# Patient Record
Sex: Male | Born: 1940
Health system: Southern US, Community
[De-identification: ages and names within clinical notes are randomized; demographics above are authoritative.]

## PROBLEM LIST (undated history)

## (undated) DIAGNOSIS — K635 Polyp of colon: Secondary | ICD-10-CM

## (undated) DIAGNOSIS — F329 Major depressive disorder, single episode, unspecified: Secondary | ICD-10-CM

## (undated) DIAGNOSIS — T7840XA Allergy, unspecified, initial encounter: Secondary | ICD-10-CM

## (undated) DIAGNOSIS — E785 Hyperlipidemia, unspecified: Secondary | ICD-10-CM

## (undated) DIAGNOSIS — R112 Nausea with vomiting, unspecified: Secondary | ICD-10-CM

## (undated) DIAGNOSIS — I499 Cardiac arrhythmia, unspecified: Secondary | ICD-10-CM

## (undated) DIAGNOSIS — F32A Depression, unspecified: Secondary | ICD-10-CM

## (undated) DIAGNOSIS — I10 Essential (primary) hypertension: Secondary | ICD-10-CM

## (undated) DIAGNOSIS — R51 Headache: Secondary | ICD-10-CM

## (undated) DIAGNOSIS — F410 Panic disorder [episodic paroxysmal anxiety] without agoraphobia: Secondary | ICD-10-CM

## (undated) DIAGNOSIS — I484 Atypical atrial flutter: Secondary | ICD-10-CM

## (undated) DIAGNOSIS — IMO0001 Reserved for inherently not codable concepts without codable children: Secondary | ICD-10-CM

## (undated) DIAGNOSIS — K219 Gastro-esophageal reflux disease without esophagitis: Secondary | ICD-10-CM

## (undated) DIAGNOSIS — I4891 Unspecified atrial fibrillation: Secondary | ICD-10-CM

## (undated) DIAGNOSIS — K5790 Diverticulosis of intestine, part unspecified, without perforation or abscess without bleeding: Secondary | ICD-10-CM

## (undated) DIAGNOSIS — Z9889 Other specified postprocedural states: Secondary | ICD-10-CM

## (undated) HISTORY — DX: Panic disorder (episodic paroxysmal anxiety): F41.0

## (undated) HISTORY — DX: Essential (primary) hypertension: I10

## (undated) HISTORY — DX: Headache: R51

## (undated) HISTORY — PX: VASECTOMY: SHX75

## (undated) HISTORY — DX: Hyperlipidemia, unspecified: E78.5

## (undated) HISTORY — DX: Unspecified atrial fibrillation: I48.91

## (undated) HISTORY — DX: Gastro-esophageal reflux disease without esophagitis: K21.9

## (undated) HISTORY — DX: Diverticulosis of intestine, part unspecified, without perforation or abscess without bleeding: K57.90

## (undated) HISTORY — DX: Allergy, unspecified, initial encounter: T78.40XA

## (undated) HISTORY — PX: INGUINAL HERNIA REPAIR: SUR1180

## (undated) HISTORY — DX: Major depressive disorder, single episode, unspecified: F32.9

## (undated) HISTORY — DX: Polyp of colon: K63.5

## (undated) HISTORY — PX: OTHER SURGICAL HISTORY: SHX169

## (undated) HISTORY — DX: Atypical atrial flutter: I48.4

## (undated) HISTORY — DX: Depression, unspecified: F32.A

## (undated) HISTORY — DX: Reserved for inherently not codable concepts without codable children: IMO0001

---
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
## 2006-02-12 LAB — HM COLONOSCOPY: HM Colonoscopy: NORMAL

## 2006-04-21 ENCOUNTER — Ambulatory Visit: Payer: Self-pay | Admitting: Gastroenterology

## 2006-05-07 ENCOUNTER — Ambulatory Visit: Payer: Self-pay | Admitting: Gastroenterology

## 2006-05-07 ENCOUNTER — Encounter (INDEPENDENT_AMBULATORY_CARE_PROVIDER_SITE_OTHER): Payer: Self-pay | Admitting: Specialist

## 2007-12-29 HISTORY — PX: CARDIAC CATHETERIZATION: SHX172

## 2008-11-27 DIAGNOSIS — I4891 Unspecified atrial fibrillation: Secondary | ICD-10-CM

## 2008-11-27 HISTORY — DX: Unspecified atrial fibrillation: I48.91

## 2008-12-12 ENCOUNTER — Encounter: Admission: RE | Admit: 2008-12-12 | Discharge: 2008-12-12 | Payer: Self-pay | Admitting: Interventional Cardiology

## 2008-12-13 ENCOUNTER — Inpatient Hospital Stay (HOSPITAL_BASED_OUTPATIENT_CLINIC_OR_DEPARTMENT_OTHER): Admission: RE | Admit: 2008-12-13 | Discharge: 2008-12-13 | Payer: Self-pay | Admitting: Interventional Cardiology

## 2008-12-17 ENCOUNTER — Encounter: Admission: RE | Admit: 2008-12-17 | Discharge: 2008-12-17 | Payer: Self-pay | Admitting: Interventional Cardiology

## 2009-02-19 ENCOUNTER — Ambulatory Visit: Payer: Self-pay | Admitting: Surgery

## 2009-12-09 ENCOUNTER — Encounter: Admission: RE | Admit: 2009-12-09 | Discharge: 2009-12-09 | Payer: Self-pay | Admitting: Family Medicine

## 2010-01-20 ENCOUNTER — Encounter: Admission: RE | Admit: 2010-01-20 | Discharge: 2010-01-20 | Payer: Self-pay | Admitting: Surgery

## 2010-01-28 ENCOUNTER — Ambulatory Visit: Payer: Self-pay | Admitting: Surgery

## 2011-01-17 ENCOUNTER — Other Ambulatory Visit: Payer: Self-pay | Admitting: Surgery

## 2011-01-17 DIAGNOSIS — I719 Aortic aneurysm of unspecified site, without rupture: Secondary | ICD-10-CM

## 2011-02-03 ENCOUNTER — Ambulatory Visit
Admission: RE | Admit: 2011-02-03 | Discharge: 2011-02-03 | Disposition: A | Discharge status: Home / Self Care | Payer: Medicare Other | Source: Ambulatory Visit | Attending: Surgery | Admitting: Surgery

## 2011-02-03 ENCOUNTER — Encounter (INDEPENDENT_AMBULATORY_CARE_PROVIDER_SITE_OTHER): Payer: Medicare Other | Admitting: Surgery

## 2011-02-03 ENCOUNTER — Other Ambulatory Visit: Payer: Self-pay | Admitting: Surgery

## 2011-02-03 ENCOUNTER — Encounter: Payer: Self-pay | Admitting: Surgery

## 2011-02-03 DIAGNOSIS — I712 Thoracic aortic aneurysm, without rupture, unspecified: Secondary | ICD-10-CM

## 2011-02-03 DIAGNOSIS — I719 Aortic aneurysm of unspecified site, without rupture: Secondary | ICD-10-CM

## 2011-02-03 DIAGNOSIS — I729 Aneurysm of unspecified site: Secondary | ICD-10-CM

## 2011-02-03 MED ORDER — GADOBENATE DIMEGLUMINE 529 MG/ML IV SOLN
20.0000 mL | Freq: Once | INTRAVENOUS | Status: AC | PRN
  Administered 2011-02-03: 20 mL via INTRAVENOUS

## 2011-02-06 NOTE — Assessment & Plan Note (Signed)
OFFICE VISIT  ZVI, DUPLANTIS DOB:  08-27-1941                                        February 03, 2011 CHART #:  [id number]  The patient returned to my office today for followup of a ascending aortic aneurysm.  I last saw him on January 28, 2010.  His last MR angiogram of the chest dated January 20, 2010, that showed a maximum diameter to be 4.8 cm at the level of the aortic root which did not appear any difference from the previous scan 1 year prior.  He said that over the past year, he has continued to feel fairly well overall.  He has had no chest pain or pressure.  He denies any shortness of breath. He has been watching his diet very closely and has lost weight.  He has been walking about one mile per day over about 20 minutes and said that he was never tired at the end of that walk.  He has been hesitant to take up the pace.  PHYSICAL EXAMINATION:  Blood pressure 142/84, pulse is 76 and regular, respiratory rate is 18 unlabored.  Oxygen saturation on room air is 97%. He looks well.  Cardiac exam shows regular rate and rhythm with no murmur or gallop.  His lungs are clear.  There is no peripheral edema.  A MR angiogram done today shows no change in the size of the fusiform ascending aortic aneurysm compared to his previous MRI on January 20, 2010.  IMPRESSION:  The patient as a stable 4.8-cm fusiform ascending aortic aneurysm.  I will plan to repeat his MR angiogram in 1-year.  We discussed good blood pressure control again.  He seems very motivated to modify any cardiac risk factors and is trying to take good care of himself over the help of his wife.  I will see him back in 1-year.  Evelene Croon, M.D. Electronically Signed  BB/MEDQ  D:  02/03/2011  T:  02/03/2011  Job:  [id number]  cc:   Lyn Records, M.D. Evelena Peat, M.D.

## 2011-02-12 ENCOUNTER — Ambulatory Visit (INDEPENDENT_AMBULATORY_CARE_PROVIDER_SITE_OTHER): Payer: Medicare Other | Admitting: Family Medicine

## 2011-02-12 ENCOUNTER — Encounter: Payer: Self-pay | Admitting: Family Medicine

## 2011-02-12 DIAGNOSIS — I712 Thoracic aortic aneurysm, without rupture, unspecified: Secondary | ICD-10-CM

## 2011-02-12 DIAGNOSIS — I10 Essential (primary) hypertension: Secondary | ICD-10-CM

## 2011-02-12 DIAGNOSIS — IMO0001 Reserved for inherently not codable concepts without codable children: Secondary | ICD-10-CM

## 2011-02-12 DIAGNOSIS — K219 Gastro-esophageal reflux disease without esophagitis: Secondary | ICD-10-CM

## 2011-02-12 DIAGNOSIS — E785 Hyperlipidemia, unspecified: Secondary | ICD-10-CM | POA: Insufficient documentation

## 2011-02-12 NOTE — Patient Instructions (Signed)
Continue weight loss efforts. Continue regular exercise.

## 2011-02-12 NOTE — Progress Notes (Signed)
  Subjective:    Patient ID: Joseph [name], male    DOB: [birth year], 70 y.o.   MRN: [id number]  HPI  Patient is new to establish care.   Chronic problems include history of GERD , history depression , hypertension , hyperlipidemia , and ascending thoracic aneurysm 4-1/2 cm diameter and followed by cardiothoracic surgery.   Medications reviewed. Previous flushing with Crestor and only takes this once per week. Blood pressure controlled with Diovan 320 mg daily, metoprolol 100 mg daily and takes hydrochlorothiazide only once per week. Blood pressures consistently less than 130/80 at home. No orthostatic symptoms.   Lipids from 3 months ago at prior physician reviewed with LDL low 100 range.    patient had prior appendectomy. Family history significant for father with lung cancer. Mother heart disease and brother with heart disease in his 84s.  Social history is that he is nonsmoker. No alcohol use. Prior colonoscopy 2007. Tetanus 2008. Pneumovax 2008.   Review of Systems  Constitutional: Negative for fever, activity change, appetite change and fatigue.  HENT: Negative for ear pain, congestion and trouble swallowing.   Eyes: Negative for pain and visual disturbance.  Respiratory: Negative for cough, shortness of breath and wheezing.   Cardiovascular: Negative for chest pain and palpitations.  Gastrointestinal: Negative for nausea, vomiting, abdominal pain, diarrhea, constipation, blood in stool, abdominal distention and rectal pain.  Genitourinary: Negative for dysuria, hematuria and testicular pain.  Musculoskeletal: Negative for joint swelling and arthralgias.  Skin: Negative for rash.  Neurological: Negative for dizziness, syncope and headaches.  Hematological: Negative for adenopathy.  Psychiatric/Behavioral: Negative for confusion and dysphoric mood.       Objective:   Physical Exam  patient is alert and in no distress. He is moderately obese  H. EENT eardrums normal.  Oropharynx clear. Pupils equal reactive to light. Neck supple no mass Chest clear to auscultation Heart regular rhythm and rate with no murmur Extremities trace edema lower legs bilaterally       Assessment & Plan:   #1 hypertension stable  #2 hyperlipidemia  #3  Ascending thoracic aneurysm followed by CVTS #4 history of GERD #5 history of anxiety and depression

## 2011-02-26 ENCOUNTER — Emergency Department (HOSPITAL_COMMUNITY)
Admission: EM | Admit: 2011-02-26 | Discharge: 2011-02-26 | Disposition: A | Discharge status: Home / Self Care | Payer: Medicare Other | Attending: Emergency Medicine | Admitting: Emergency Medicine

## 2011-02-26 ENCOUNTER — Emergency Department (HOSPITAL_COMMUNITY): Payer: Medicare Other

## 2011-02-26 DIAGNOSIS — R0989 Other specified symptoms and signs involving the circulatory and respiratory systems: Secondary | ICD-10-CM | POA: Insufficient documentation

## 2011-02-26 DIAGNOSIS — Z79899 Other long term (current) drug therapy: Secondary | ICD-10-CM | POA: Insufficient documentation

## 2011-02-26 DIAGNOSIS — R002 Palpitations: Secondary | ICD-10-CM | POA: Insufficient documentation

## 2011-02-26 DIAGNOSIS — I10 Essential (primary) hypertension: Secondary | ICD-10-CM | POA: Insufficient documentation

## 2011-02-26 DIAGNOSIS — R000 Tachycardia, unspecified: Secondary | ICD-10-CM | POA: Insufficient documentation

## 2011-02-26 DIAGNOSIS — R0609 Other forms of dyspnea: Secondary | ICD-10-CM | POA: Insufficient documentation

## 2011-02-26 DIAGNOSIS — E7800 Pure hypercholesterolemia, unspecified: Secondary | ICD-10-CM | POA: Insufficient documentation

## 2011-02-26 DIAGNOSIS — F411 Generalized anxiety disorder: Secondary | ICD-10-CM | POA: Insufficient documentation

## 2011-02-26 LAB — TSH: TSH: 2.223 u[IU]/mL (ref 0.350–4.500)

## 2011-02-26 LAB — BRAIN NATRIURETIC PEPTIDE: Pro B Natriuretic peptide (BNP): 79 pg/mL (ref 0.0–100.0)

## 2011-02-26 LAB — CBC
HCT: 41.8 % (ref 39.0–52.0)
Hemoglobin: 15.1 g/dL (ref 13.0–17.0)
RDW: 12.6 % (ref 11.5–15.5)
WBC: 10.6 10*3/uL — ABNORMAL HIGH (ref 4.0–10.5)

## 2011-02-26 LAB — DIFFERENTIAL
Basophils Absolute: 0 10*3/uL (ref 0.0–0.1)
Lymphocytes Relative: 5 % — ABNORMAL LOW (ref 12–46)
Neutro Abs: 9.6 10*3/uL — ABNORMAL HIGH (ref 1.7–7.7)

## 2011-02-26 LAB — PROTIME-INR
INR: 0.98 (ref 0.00–1.49)
Prothrombin Time: 13.2 seconds (ref 11.6–15.2)

## 2011-02-26 LAB — POCT CARDIAC MARKERS: CKMB, poc: <1 ng/mL — ABNORMAL LOW (ref 1.0–8.0)

## 2011-02-26 LAB — COMPREHENSIVE METABOLIC PANEL
Albumin: 4.2 g/dL (ref 3.5–5.2)
Alkaline Phosphatase: 47 U/L (ref 39–117)
BUN: 7 mg/dL (ref 6–23)
Calcium: 9 mg/dL (ref 8.4–10.5)
Creatinine, Ser: 0.99 mg/dL (ref 0.4–1.5)
Potassium: 3.8 mEq/L (ref 3.5–5.1)
Total Protein: 6.5 g/dL (ref 6.0–8.3)

## 2011-02-26 LAB — POCT I-STAT, CHEM 8
Chloride: 102 mEq/L (ref 96–112)
HCT: 45 % (ref 39.0–52.0)
Potassium: 3.8 mEq/L (ref 3.5–5.1)
Sodium: 140 mEq/L (ref 135–145)

## 2011-02-26 LAB — RAPID URINE DRUG SCREEN, HOSP PERFORMED
Amphetamines: NOT DETECTED
Cocaine: NOT DETECTED
Tetrahydrocannabinol: NOT DETECTED

## 2011-02-26 LAB — GLUCOSE, CAPILLARY: Glucose-Capillary: 126 mg/dL — ABNORMAL HIGH (ref 70–99)

## 2011-02-26 LAB — APTT: aPTT: 28 seconds (ref 24–37)

## 2011-02-26 LAB — TROPONIN I: Troponin I: <0.01 ng/mL (ref 0.00–0.06)

## 2011-03-06 NOTE — H&P (Signed)
NAMELANEY, BAGSHAW NO.:  [id number]  MEDICAL RECORD NO.:  [id number]           PATIENT TYPE:  E  LOCATION:  [facility]                         FACILITY:  [facility]  PHYSICIAN:  Lyn Records, M.D.   DATE OF BIRTH:  04-May-1941  DATE OF ADMISSION:  02/26/2011 DATE OF DISCHARGE:  02/26/2011                             HISTORY & PHYSICAL   This is an emergency room evaluation at the request of the emergency room staff.  CONCLUSIONS: 1. Sustained tachy palpitations of uncertain etiology, rule out PSVT. 2. Stable descending aortic aneurysm. 3. Hypertension. 4. Hyperlipidemia. 5. Anxiety disorder. 6. Obesity.  RECOMMENDATIONS: 1. Continuous ambulatory monitor for 2 weeks. 2. Continue the current medical regimen as listed in the chart below. 3  Engage in typical activity.  COMMENTS:  The patient is well known to me.  I last saw him in August 2011 when he was doing relatively well with the exception of an elevation in blood pressure.  The patient has not been sleeping well lately.  He has been having episodes of tachycardia that he can both feel in his chest and hear in his left year.  He states that since 2010 he has been having tinnitus particularly in the left ear.  Now, he can hear his heart rates.  When the heart rate increases above about 70 beats per minute, he gets frightened.  He has an underlying anxiety disorder.  Over the past 2-3 weeks, he has noted spells of very rapid heart beating.  These episodes are associated with anxiety and fear, but there is no chest discomfort, orthopnea, PND, and he has not had syncope.  He has had a prior cardiac catheterization in 2009 that was negative for obstructive CAD.  He is known to have a structurally normal heart.  He does have a descending aortic aneurysm that when last evaluated in mid February 2012 had not changed in size.  This was followed by Dr. Evelene Croon.  The patient has not experienced chest  discomfort.  He has occasional shortness of breath.  He denies lower extremity swelling and orthopnea.  CURRENT MEDICATIONS:  Based upon the patient's last office visit include: 1. Diovan 320 mg per day. 2. Crestor 10 mg per day. 3. Metoprolol succinate 100 mg per day. 4. Omega-3 fish oil 1000 mg per day. 5. Coenzyme Q10 200 mg 2 tablets a day. 6. Aspirin 81 mg per day. 7. Zyrtec 10 mg per day. 8. Hydrochlorothiazide 25 mg as needed.  SOCIAL HISTORY:  Negative for smoking.  Negative alcohol.  ALLERGIES:  Denies.  SIGNIFICANT MEDICAL PROBLEMS: 1. Anxiety disorder. 2. Hyperlipidemia. 3. See other problems listed above.  PHYSICAL EXAMINATION:  VITAL SIGNS:  Blood pressures in the emergency room have been normal ranging between 118-138 systolic/60-75 diastolic. Respiratory rate is unremarkable at 16 breaths per minute.  Heart rate is 70. GENERAL:  The patient appears anxious. NECK:  Carotids are 2+ and symmetric bilaterally. LUNGS:  Clear. CARDIAC:  No gallop, rub, click, or murmur. ABDOMEN:  Obese and nontender. EXTREMITIES:  No edema. NEUROLOGICAL:  The patient is anxious, but has no focal deficits.  Electrocardiogram performed by the emergency room staff reveals normal sinus rhythm, incomplete right bundle-branch block, and left anterior hemiblock.  This pattern is very similar to pattern on his EKG noted in August 2011 in our office.  I have attached a copy of that electrocardiogram with this data.  Chest x-ray is unremarkable.  LABORATORY DATA:  No significant abnormality.  BNP is less than 100. Chest x-ray reveals no evidence of edema. The mediastinum is unremarkable.  Cardiac markers are unremarkable.  Hemoglobin is greater than 14.5.  White count is normal.  BUN and creatinine are normal. Blood sugar is mildly elevated  DISCUSSION:  The patient is having palpitations, anxiety, and dyspnea. It is difficult to tell what magnitude/impact the anxiety is having on the  palpitations that he feels.  There is clearly no evidence of heart failure.  No clinical features that would suggest pulmonary emboli.  O2 saturations are normal on room air.  He is not tachycardic.  O2 sat was 98%.  We will plan to place an ambulatory monitor to rule out the possibility of paroxysmal atrial tachycardia or other arrhythmia that could be potentially dangerous that we are not suspecting.  He is reassured at this time.  I do not believe further evaluation is necessary at this time.     Lyn Records, M.D.     HWS/MEDQ  D:  02/26/2011  T:  02/27/2011  Job:  [id number]  cc:   Evelene Croon, M.D. Evelena Peat, M.D.  Electronically Signed by Verdis Prime M.D. on 03/05/2011 09:28:31 AM

## 2011-03-23 ENCOUNTER — Encounter: Payer: Self-pay | Admitting: Family Medicine

## 2011-05-12 NOTE — Cardiovascular Report (Signed)
NAMESPENCE, SOBERANO NO.:  [id number]   MEDICAL RECORD NO.:  [id number]          PATIENT TYPE:  OIB   LOCATION:  1961                         FACILITY:  [facility]   PHYSICIAN:  Lyn Records, M.D.   DATE OF BIRTH:  1941-05-01   DATE OF PROCEDURE:  12/13/2008  DATE OF DISCHARGE:  12/13/2008                            CARDIAC CATHETERIZATION   INDICATIONS:  Recurrent exertional chest pressure over the past 2-3  months.  The patient is unable to perform the typical activities without  severe symptoms.  EKG and other laboratory data normal.  He does have  risk factors.  Coronary angiography was felt to be the most direct way  to exclude the possibility of high-grade coronary artery disease  limiting symptoms.   PROCEDURES PERFORMED:  1. Left heart cath.  2. Selective coronary angio.  3. Left ventriculography.   DESCRIPTION:  After 2 mg of Versed and 50 mcg of fentanyl, 1% Xylocaine  was locally infiltrated into the right femoral area.  A 4-French  arterial sheath was placed using modified Seldinger technique.  A 4-  [place] A2 multipurpose catheter was used for hemodynamic recordings.  There was some difficulty entering the LV because of the enlarged aortic  root.  Left ventriculography was performed by hand injection.  LV end-  diastolic pressure was normal at 14 mmHg.  Right coronary angiography  was performed with multipurpose catheter.  Left coronary angiography was  performed with a 4-French #5 left Judkins catheter.  The patient  tolerated the procedure without complications.   RESULTS:  1. Hemodynamic data:      a.     Aortic pressure 126/76.      b.     Left ventricular pressure 128/40 mmHg.  2. Left ventriculography:  LV cavity size and function are normal.  EF      is 60%.  No obvious mitral regurgitation is noted.  3. Coronary angiography.      a.     Left main coronary artery:  Normal.      b.     Left anterior descending coronary artery:  Normal.   The       vessel gives origin to 2 diagonal branches also normal.  The LAD       is transapical.      c.     Circumflex artery:  The circumflex coronary artery gives       origin to 3 marginals, they are normal.      d.     Right coronary artery:  The right coronary artery is normal.       The PDA is large and normal.   CONCLUSION:  1. Normal coronary arteries.  2. Normal left ventricular function.  3. Enlarged aortic root based upon catheter movement within the aorta      and the size of catheter needed to cannulate the left coronary.   RECOMMENDATIONS:  Consider pulmonary CTA with aortic angiography to rule  out recurrent pulmonary emboli and aortic aneurysm.  Aortic dissection  is doubtful.       Barry Dienes.  Katrinka Blazing, M.D.  Electronically Signed     HWS/MEDQ  D:  12/13/2008  T:  12/13/2008  Job:  [id number]   cc:   Evelena Peat, M.D.

## 2011-05-12 NOTE — Consult Note (Signed)
NEW PATIENT CONSULTATION   Joseph [name], Joseph [name]  DOB:  Nov 03, 1941                                        February 19, 2009  CHART #:  [id number]   REASON FOR CONSULTATION:  Ascending aortic aneurysm.   CLINICAL HISTORY:  I was asked by Dr. Katrinka Blazing to evaluate the patient for  an ascending aortic aneurysm.  He is a 70 year old gentleman with  multiple risk factors for cardiac disease who began having episodes of  left-sided chest pressure, dyspnea on exertion, pounding heartbeat, and  flushing in his face in April 2009.  He had been active up to that  point, but after he developed these symptoms, he began decreasing his  activity.  The patient was seen by Dr. Katrinka Blazing and given his risk factors  for heart disease including a brother who died in his 40s of myocardial  infarction.  It was decided that the patient should undergo cardiac  catheterization.  This was performed on 12/13/2008.  This showed normal  coronary anatomy.  Ejection fraction was 60% with no mitral  regurgitation.  The aortic root was noted to be enlarged.  The patient  was subsequently scheduled for a CT angiogram of the chest on  12/17/2008.  This showed a 4.5 x 4.7-cm fusiform dilatation of the  ascending thoracic aorta, which appeared to extend from the  supravalvular level to the takeoff of the innominate artery.  There was  no sign of aortic dissection.  The patient reports that since his  cardiac catheterization, he has begun exercising again without any chest  pain symptoms.   REVIEW OF SYSTEMS:  Currently is as follows:  General:  He denies any  fever or chills.  He has had planned weight loss from about 315 pounds  to about 289 pounds.  He denies fatigue.  Eyes:  Negative.  ENT:  Negative.  Endocrine:  He denies diabetes and hypothyroidism.  Cardiovascular:  He denies any chest pain or shortness of breath.  He  denies any palpitations.  He has had no peripheral edema.  Respiratory:  He denies  cough and sputum production.  GI:  He has had no nausea or  vomiting.  He denies melena and bright red blood per rectum.  GU:  He  denies dysuria and hematuria.  He does have urinary frequency.  Vascular:  He denies claudication and phlebitis.  Neurological:  He does  have occasional headaches.  He has never had TIA or stroke.  He denies  any focal weakness or numbness.  Psychiatric:  He does admit to a  history of anxiety.  Hematological:  Negative.   ALLERGIES:  Negative.   MEDICATIONS:  Diovan HCT 160/25 daily, Crestor 10 mg daily, aspirin 81  mg daily, Zyrtec p.r.n., fish oil and flaxseed combination pill 3 times  per day, and Lopressor 100 mg daily.   PAST MEDICAL HISTORY:  Significant for hypertension and hyperlipidemia.  He is status post appendectomy in 1950 and inguinal herniorrhaphy in  1957.   FAMILY HISTORY:  His father died at 7 with lung cancer and his mother  died at 12 from heart disease.  One brother died at 44 with myocardial  infarction.  He also had renal failure.   SOCIAL HISTORY:  He is a retired Air[employer] pilot.  He has never smoked.  Denies alcohol use.  PHYSICAL EXAMINATION:  VITAL SIGNS:  Today, his blood pressure is 148/90  and his pulse is 74 and regular.  Respiratory rate is 18 and unlabored.  Oxygen saturation on room air is 98%.  GENERAL:  He is an obese white male, in no distress.  HEENT:  Normocephalic and atraumatic.  Pupils are equal and reactive to  light and accommodation.  Extraocular muscles are intact.  His throat is  clear.  NECK:  Normal carotid pulses bilaterally.  There are no bruits.  There  is no adenopathy or thyromegaly.  CARDIAC:  Regular rate and rhythm with normal S1 and S2.  There is no  murmur, rub, or gallop.  LUNGS:  Clear.  ABDOMEN:  Active bowel sounds.  His abdomen is soft, obese, and  nontender.  There are no palpable masses or organomegaly.  EXTREMITIES:  No peripheral edema.  Pedal pulses are palpable bilaterally.   SKIN:  Warm and dry.  NEUROLOGIC:  Be alert and oriented x3.  Motor and sensory exams are  grossly normal.   IMPRESSION:  The patient has a 4.5 x 4.7-cm fusiform ascending aortic  aneurysm of unclear duration.  I doubt this had anything to do with the  chest pain symptoms that he had over the past year, which resolved  spontaneously since his cardiac catheterization.  I do not think this  requires surgery at this time, but does require close followup.  I  usually do not recommend repairing ascending aortic aneurysms until they  are about 5.5 cm or showing rapid enlargement.  I discussed the  pathology and followup with him and his wife.  I will plan to see him  back in 1 year with a MR angiogram of the thoracic aorta.  I discussed  the importance of good blood  pressure control with him.  He seems much more at ease now with his  questions have been completely answered.   Evelene Croon, M.D.  Electronically Signed   BB/MEDQ  D:  02/19/2009  T:  02/20/2009  Job:  [id number]   cc:   Lyn Records, M.D.  Evelena Peat, M.D.

## 2011-05-12 NOTE — Assessment & Plan Note (Signed)
OFFICE VISIT   Joseph [name], Joseph [name]  DOB:  April 14, 1941                                        January 28, 2010  CHART #:  [id number]   HISTORY OF PRESENT ILLNESS:  The patient returned to my office today for  followup of an ascending aortic aneurysm.  I last saw him on February 19, 2009, for his initial consultation, at which time CT scan had shown  that he had a 4.5- x 4.7-cm fusiform aneurysm of the ascending aorta  beginning at the supravalvular level and extending up to the takeoff of  the innominate artery.  The patient had subsequently had a cardiac  catheterization which showed no coronary disease.  We elected to follow  this up in 1 year with a MR angiogram of the chest.  He said that over  the past year he has been taking much better care of himself and has  been watching his diet closely and lost about 50 pounds.  He is walking  about 1 mile per day over 24 minutes without any chest pain or shortness  of breath.  He said he still worries a lot about his aneurysm or whether  it is going to rupture.   PHYSICAL EXAMINATION:  Blood pressure 160/78, pulse is 70 and regular,  respiratory rate is 18 and unlabored.  Oxygen saturation on room air is  98%.  He said that he checks his blood pressure frequently at home and  it is always under tight control at 120/70 or less.  He said that he is  just nervous about coming in to talk about his aneurysm today.   PHYSICAL EXAMINATION:  Cardiac exam shows a regular rate and rhythm with  a normal S1 and S2.  There is no murmur, rub, or gallop.  The lungs are  clear.   DIAGNOSTIC TESTS:  MR angiogram of the chest dated January 20, 2010,  shows stable size of the ascending aortic aneurysm.  The maximum  diameter measured by Radiology was 4.8 cm at the level of the aortic  root and this does not look any different than on his previous scan 1  year ago.  The diameter in the mid ascending aorta was about 4.5 cm and  is  unchanged from a year ago.  There was no evidence of dissection.  The  aortic arch and descending aorta appeared to be of normal caliber.   IMPRESSION:  The patient has a stable ascending aortic aneurysm with a  maximum diameter about 4.8 cm at the mid aortic sinus level.  I do not  think there is any indication to recommend surgical treatment at this  time, and I would continue following his stable aneurysm with tight  blood pressure control.  The risk of surgery at this point is much  higher than the risk of developing aortic dissection or rupture.  I  reiterated the importance of good blood pressure control with he and his  wife and he said that he takes his medicines religiously and follows  blood pressure closely at home and is always under control.  He seems  much relieved now that I have told him that I do not think his aneurysm  has changed over the past year.  I will plan to see him back in 1 year  with a  followup MR angiogram of the chest.  He will see Dr. Verdis Prime  for his cardiology follow up.   Evelene Croon, M.D.  Electronically Signed   BB/MEDQ  D:  01/28/2010  T:  01/29/2010  Job:  [id number]   cc:   Lyn Records, M.D.  Evelena Peat, M.D.

## 2011-05-13 ENCOUNTER — Ambulatory Visit (INDEPENDENT_AMBULATORY_CARE_PROVIDER_SITE_OTHER): Payer: Medicare Other | Admitting: Family Medicine

## 2011-05-13 ENCOUNTER — Encounter: Payer: Self-pay | Admitting: Family Medicine

## 2011-05-13 DIAGNOSIS — I712 Thoracic aortic aneurysm, without rupture, unspecified: Secondary | ICD-10-CM

## 2011-05-13 DIAGNOSIS — E785 Hyperlipidemia, unspecified: Secondary | ICD-10-CM

## 2011-05-13 DIAGNOSIS — IMO0001 Reserved for inherently not codable concepts without codable children: Secondary | ICD-10-CM

## 2011-05-13 DIAGNOSIS — I4891 Unspecified atrial fibrillation: Secondary | ICD-10-CM | POA: Insufficient documentation

## 2011-05-13 DIAGNOSIS — I10 Essential (primary) hypertension: Secondary | ICD-10-CM

## 2011-05-13 LAB — LIPID PANEL
Cholesterol: 150 mg/dL (ref 0–200)
HDL: 46.4 mg/dL (ref >39.00)
Triglycerides: 87 mg/dL (ref 0.0–149.0)
VLDL: 17.4 mg/dL (ref 0.0–40.0)

## 2011-05-13 LAB — HEPATIC FUNCTION PANEL
ALT: 39 U/L (ref 0–53)
AST: 27 U/L (ref 0–37)
Albumin: 4 g/dL (ref 3.5–5.2)
Total Protein: 6.3 g/dL (ref 6.0–8.3)

## 2011-05-13 LAB — BASIC METABOLIC PANEL
Calcium: 9.2 mg/dL (ref 8.4–10.5)
GFR: 76.78 mL/min (ref >60.00)
Potassium: 4 mEq/L (ref 3.5–5.1)
Sodium: 139 mEq/L (ref 135–145)

## 2011-05-13 MED ORDER — HYDROCHLOROTHIAZIDE 25 MG PO TABS: 25.0000 mg | ORAL_TABLET | Freq: Every day | ORAL | Status: DC

## 2011-05-13 MED ORDER — ROSUVASTATIN CALCIUM 10 MG PO TABS: 10.0000 mg | ORAL_TABLET | Freq: Every day | ORAL | Status: DC

## 2011-05-13 NOTE — Progress Notes (Signed)
  Subjective:    Patient ID: Joseph [name], male    DOB: 12-05-1941, 70 y.o.   MRN: [id number]  HPI Patient seen for followup. History of hypertension, hyperlipidemia, ascending aortic aneurysm. Cardiac catheterization 2009 normal coronaries. Compliant with all medications. No side effects.   Needs lab work today.  In March developed some palpitations. Cardiac monitor revealed atrial fibrillation. Patient now takes Xarelto.  No bleeding complications. No recent palpitations. Denies chest pain. No shortness of breath. Recently started on Lamisil for toenail fungus per dermatologist. Recent sleep study ruled out obstructive sleep apnea  Patient is not exercising regularly. Has started some walking. Nonsmoker.   Review of Systems  Constitutional: Negative for fever, chills and fatigue.  Respiratory: Negative for cough and shortness of breath.   Cardiovascular: Negative for chest pain, palpitations and leg swelling.  Genitourinary: Negative for dysuria.  Neurological: Negative for dizziness and syncope.  Hematological: Negative for adenopathy. Does not bruise/bleed easily.       Objective:   Physical Exam  Constitutional: He is oriented to person, place, and time. He appears well-developed and well-nourished.  HENT:  Right Ear: External ear normal.  Left Ear: External ear normal.  Mouth/Throat: Oropharynx is clear and moist.  Neck: No thyromegaly present.  Cardiovascular: Normal rate, regular rhythm and normal heart sounds.   No murmur heard. Pulmonary/Chest: Breath sounds normal. No respiratory distress. He has no wheezes. He has no rales. He exhibits no tenderness.  Musculoskeletal: He exhibits no edema.  Lymphadenopathy:    He has no cervical adenopathy.  Neurological: He is alert and oriented to person, place, and time.          Assessment & Plan:  #1 atrial fibrillation recent onset. Normal rhythm today. Continue followup with cardiology.  Continue Xarelto. #2  hypertension stable continue current medications  #3 hyperlipidemia. Check lipid and hepatic panel today #4 ascending aortic aneurysm followed by CVTS.

## 2011-05-14 NOTE — Progress Notes (Signed)
Quick Note:  Labs faxed to Dr Katrinka Blazing, pt informed, copy mailed to pt home upon request ______

## 2011-05-28 ENCOUNTER — Encounter: Payer: Self-pay | Admitting: Gastroenterology

## 2011-05-28 ENCOUNTER — Ambulatory Visit (INDEPENDENT_AMBULATORY_CARE_PROVIDER_SITE_OTHER): Payer: Medicare Other | Admitting: Gastroenterology

## 2011-05-28 DIAGNOSIS — Z7901 Long term (current) use of anticoagulants: Secondary | ICD-10-CM

## 2011-05-28 DIAGNOSIS — Z860100 Personal history of colon polyps, unspecified: Secondary | ICD-10-CM | POA: Insufficient documentation

## 2011-05-28 MED ORDER — PEG-KCL-NACL-NASULF-NA ASC-C 100 G PO SOLR: 1.0000 | Freq: Once | ORAL | Status: AC

## 2011-05-28 NOTE — Progress Notes (Signed)
History of Present Illness:  Joseph [name] is a pleasant 70 year old white male with history of colon polyps, atrial fibrillation, on xarelto, here to schedule followup colonoscopy. An adenomatous polyp was removed in 2007. Bowels are somewhat irregular. He denies rectal bleeding or melena. He takes Xarelto because of paroxysmal atrial fibrillation.    Review of Systems: He complains of hot flashes, bloating and anorexia. He should use this to his medications. Pertinent positive and negative review of systems were noted in the above HPI section. All other review of systems were otherwise negative.    Current Medications, Allergies, Past Medical History, Past Surgical History, Family History and Social History were reviewed in [facility] electronic medical record  Vital signs were reviewed in today's medical record. Physical Exam: General: Well developed , well nourished, no acute distress Head: Normocephalic and atraumatic Eyes:  sclerae anicteric, EOMI Ears: Normal auditory acuity Mouth: No deformity or lesions Lungs: Clear throughout to auscultation Heart: Regular rate and rhythm; no murmurs, rubs or bruits Abdomen: Soft, non tender and non distended. No masses, hepatosplenomegaly or hernias noted. Normal Bowel sounds Rectal:deferred Musculoskeletal: Symmetrical with no gross deformities  Pulses:  Normal pulses noted Extremities: No clubbing, cyanosis, edema or deformities noted Neurological: Alert oriented x 4, grossly nonfocal Psychological:  Alert and cooperative. Normal mood and affect

## 2011-05-28 NOTE — Assessment & Plan Note (Signed)
Plan followup colonoscopy  .Xarelto will be held if approved by Dr. Caryl Never.

## 2011-05-28 NOTE — Patient Instructions (Signed)
Colonoscopy A colonoscopy is an exam to evaluate your entire colon. In this exam, your colon is cleansed. A long fiberoptic tube is inserted through your rectum and into your colon. The fiberoptic scope (endoscope) is a long bundle of enclosed and very flexible fibers. These fibers transmit light to the area examined and send images from that area to your caregiver. Discomfort is usually minimal. You may be given a drug to help you sleep (sedative) during or prior to the procedure. This exam helps to detect lumps (tumors), polyps, inflammation, and areas of bleeding. Your caregiver may also take a small piece of tissue (biopsy) that will be examined under a microscope. BEFORE THE PROCEDURE  A clear liquid diet may be required for 2 days before the exam.   Liquid injections (enemas) or laxatives may be required.   A large amount of electrolyte solution may be given to you to drink over a short period of time. This solution is used to clean out your colon.   You should be present 1 prior to your procedure or as directed by your caregiver.   Check in at the admissions desk to fill out necessary forms if not preregistered. There will be consent forms to sign prior to the procedure. If accompanied by friends or family, there is a waiting area for them while you are having your procedure.  LET YOUR CAREGIVER KNOW ABOUT:  Allergies to food or medicine.  Medicines taken, including vitamins, herbs, eyedrops, over-the-counter medicines, and creams.   Use of steroids (by mouth or creams).   Previous problems with anesthetics or numbing medicines.   History of bleeding problems or blood clots.  Previous surgery.   Other health problems, including diabetes and kidney problems.   Possibility of pregnancy, if this applies.   AFTER THE PROCEDURE  If you received a sedative and/or pain medicine, you will need to arrange for someone to drive you home.   Occasionally, there is a little blood passed  with the first bowel movement. DO NOT be concerned.  HOME CARE INSTRUCTIONS  It is not unusual to pass moderate amounts of gas and experience mild abdominal cramping following the procedure. This is due to air being used to inflate your colon during the exam. Walking or a warm pack on your belly (abdomen) may help.   You may resume all normal meals and activities after sedatives and medicines have worn off.   Only take over-the-counter or prescription medicines for pain, discomfort, or fever as directed by your caregiver. DO NOT use aspirin or blood thinners if a biopsy was taken. Consult your caregiver for medicine usage if biopsies were taken.  FINDING OUT THE RESULTS OF YOUR TEST Not all test results are available during your visit. If your test results are not back during the visit, make an appointment with your caregiver to find out the results. Do not assume everything is normal if you have not heard from your caregiver or the medical facility. It is important for you to follow up on all of your test results. SEEK IMMEDIATE MEDICAL CARE IF:  You pass large blood clots or fill a toilet with blood following the procedure. This may also occur 10 to 14 days following the procedure. This is more likely if a biopsy was taken.   You develop abdominal pain that keeps getting worse and cannot be relieved with medicine.  Document Released: 12/11/2000 Document Re-Released: 03/10/2010 [facility] Patient Information 2011 [place], [place]. Your colonoscopy is scheduled on 06/23/2011 at  3pm We will send in your MoviPrep to your pharmacy today We will contact Dr Katrinka Blazing regarding your blood thinner

## 2011-06-15 ENCOUNTER — Telehealth: Payer: Self-pay | Admitting: Gastroenterology

## 2011-06-15 NOTE — Telephone Encounter (Signed)
Called pt to inform that I contacted and left a message for Dr Lonn Georgia Nurse Karena Addison, Need to hear back something today. Letter was faxed on 05/28/2011 have not had any response.

## 2011-06-15 NOTE — Telephone Encounter (Signed)
Called pt to inform that nurse Karena Addison has not called me back yet today, I called here they did receive my fax on 5/31 but they had it scanned into there system and it was never answered. She printed off a copy and is having another provider look at it. Dr Katrinka Blazing is out of the office this week,

## 2011-06-17 NOTE — Telephone Encounter (Signed)
OK TO HOLD XARELTO 2 DAYS PRIOR PER DR Verdis Prime. WILL SEND OK DOWN TO BE FAXED IN

## 2011-06-22 ENCOUNTER — Telehealth: Payer: Self-pay | Admitting: Gastroenterology

## 2011-06-22 NOTE — Telephone Encounter (Signed)
DR Arlyce Dice PT HAD TO CX TOMORROW FOR COLON/ HE IS HAVING AFIB WILL CALL BACK TO R/S.[employer] TOLD HIM NO CHARGE. HE LEFT A MESSAGE ON THE ANSWERING SERVICE ON Sunday BUT IT WAS NEVER TAKEN OFF THE SCHEDULE.Marland KitchenMarland Kitchen

## 2011-06-22 NOTE — Telephone Encounter (Signed)
ok 

## 2011-06-23 ENCOUNTER — Other Ambulatory Visit: Payer: Medicare Other | Admitting: Gastroenterology

## 2011-10-12 ENCOUNTER — Encounter: Payer: Self-pay | Admitting: Gastroenterology

## 2011-10-27 ENCOUNTER — Ambulatory Visit (AMBULATORY_SURGERY_CENTER): Payer: Medicare Other

## 2011-10-27 VITALS — Ht 70.0 in | Wt 270.9 lb

## 2011-10-27 DIAGNOSIS — Z86010 Personal history of colonic polyps: Secondary | ICD-10-CM

## 2011-10-27 MED ORDER — PEG-KCL-NACL-NASULF-NA ASC-C 100 G PO SOLR: 1.0000 | Freq: Once | ORAL | Status: AC

## 2011-10-27 NOTE — Progress Notes (Signed)
I discussed propofol with pt at his pre-visit prior to the colonoscopy on 11/10/11 with Dr Christella Hartigan. Pt refused. Ulis Rias RN

## 2011-11-06 ENCOUNTER — Other Ambulatory Visit: Payer: Self-pay | Admitting: Dermatology

## 2011-11-10 ENCOUNTER — Encounter: Payer: Self-pay | Admitting: Gastroenterology

## 2011-11-10 ENCOUNTER — Ambulatory Visit (AMBULATORY_SURGERY_CENTER): Payer: Medicare Other | Admitting: Gastroenterology

## 2011-11-10 VITALS — BP 137/79 | HR 60 | Temp 96.4°F | Resp 17 | Ht 70.0 in | Wt 279.0 lb

## 2011-11-10 DIAGNOSIS — Z860100 Personal history of colon polyps, unspecified: Secondary | ICD-10-CM

## 2011-11-10 DIAGNOSIS — Z1211 Encounter for screening for malignant neoplasm of colon: Secondary | ICD-10-CM

## 2011-11-10 DIAGNOSIS — K5730 Diverticulosis of large intestine without perforation or abscess without bleeding: Secondary | ICD-10-CM

## 2011-11-10 MED ORDER — SODIUM CHLORIDE 0.9 % IV SOLN: 500.0000 mL | INTRAVENOUS | Status: DC

## 2011-11-10 NOTE — Progress Notes (Signed)
Pt's lst dose of blood thinner, xalrelto, was sat.  Dr. Arlyce Dice made ware of this pre-procedure. Maw  Pt tolerated the colonoscopy very well. maw

## 2011-11-10 NOTE — Patient Instructions (Addendum)
Resume xarelto today

## 2011-11-11 ENCOUNTER — Telehealth: Payer: Self-pay | Admitting: *Deleted

## 2011-11-11 NOTE — Telephone Encounter (Signed)
Id on voicemail, mess left

## 2011-11-13 ENCOUNTER — Ambulatory Visit: Payer: Medicare Other | Admitting: Family Medicine

## 2012-01-07 ENCOUNTER — Other Ambulatory Visit: Payer: Self-pay | Admitting: Surgery

## 2012-01-07 DIAGNOSIS — I712 Thoracic aortic aneurysm, without rupture, unspecified: Secondary | ICD-10-CM

## 2012-02-09 ENCOUNTER — Encounter: Payer: Self-pay | Admitting: Surgery

## 2012-02-09 ENCOUNTER — Ambulatory Visit
Admission: RE | Admit: 2012-02-09 | Discharge: 2012-02-09 | Disposition: A | Discharge status: Home / Self Care | Payer: Medicare Other | Source: Ambulatory Visit | Attending: Surgery | Admitting: Surgery

## 2012-02-09 ENCOUNTER — Ambulatory Visit (INDEPENDENT_AMBULATORY_CARE_PROVIDER_SITE_OTHER): Payer: Medicare Other | Admitting: Surgery

## 2012-02-09 VITALS — BP 138/84 | HR 72 | Resp 20 | Ht 70.0 in | Wt 279.0 lb

## 2012-02-09 DIAGNOSIS — I712 Thoracic aortic aneurysm, without rupture, unspecified: Secondary | ICD-10-CM

## 2012-02-09 MED ORDER — GADOBENATE DIMEGLUMINE 529 MG/ML IV SOLN
20.0000 mL | Freq: Once | INTRAVENOUS | Status: AC | PRN
  Administered 2012-02-09: 20 mL via INTRAVENOUS

## 2012-02-09 NOTE — Progress Notes (Signed)
[address]            [place] [address]          [phone]      HPI:  The patient returns today for followup of an ascending aortic aneurysm. I last saw him 02/03/2011 at which time a MR angiogram showed that there was no change in the size of the fusiform ascending aortic aneurysm with a maximum diameter of about 4.8 cm. Over the past year the patient said that he has continued to feel well. He denies any chest or back pain.  Current Outpatient Prescriptions  Medication Sig Dispense Refill  . ALPRAZolam (XANAX) 0.25 MG tablet Take 0.25 mg by mouth as needed.        . cetirizine (ZYRTEC) 5 MG tablet Take 5 mg by mouth as needed.        Marland Kitchen co-enzyme Q-10 30 MG capsule Take 30 mg by mouth daily. Take 400 mg daily       . famotidine (PEPCID AC) 10 MG chewable tablet Chew 10 mg by mouth as needed.        . hydrochlorothiazide (HYDRODIURIL) 25 MG tablet Take 25 mg by mouth once a week.        . metoprolol (LOPRESSOR) 100 MG tablet Take 100 mg by mouth daily.       . Multiple Vitamin (MULTIVITAMIN) tablet Take 1 tablet by mouth daily.        Marland Kitchen olmesartan (BENICAR) 20 MG tablet Take 20 mg by mouth daily.        . Rivaroxaban (XARELTO) 20 MG TABS Take by mouth daily.       . rosuvastatin (CRESTOR) 10 MG tablet Take 10 mg by mouth once a week.        . sertraline (ZOLOFT) 100 MG tablet Take 100 mg by mouth daily.        . Vitamins-Lipotropics (LIPO-FLAVONOID PLUS PO) Take by mouth daily.        Marland Kitchen zolpidem (AMBIEN) 10 MG tablet Take 10 mg by mouth at bedtime as needed.        Current Facility-Administered Medications  Medication Dose Route Frequency Provider Last Rate Last Dose  . 0.9 %  sodium chloride infusion  500 mL Intravenous Continuous Louis Meckel, MD       Facility-Administered Medications Ordered in Other Visits  Medication Dose Route Frequency Provider Last Rate Last Dose  . gadobenate dimeglumine (MULTIHANCE) injection 20 mL  20 mL  Intravenous Once PRN Medication Radiologist, MD   20 mL at 02/09/12 1140     Physical Exam:  BP 138/84  Pulse 72  Resp 20  Ht 5\' 10"  (1.778 m)  Wt 279 lb (126.554 kg)  BMI 40.03 kg/m2  SpO2 97% He looks well. Cardiac exam shows a regular rate and rhythm with normal S1 and S2. There is no murmur, rub, or gallop. Lung exam is clear.   Diagnostic Tests:  MRA CHEST WITH OR WITHOUT CONTRAST   Contrast: 20mL MULTIHANCE GADOBENATE DIMEGLUMINE 529 MG/ML IV SOLN   Comparison: 02/03/2011 and 01/20/2010.   Findings: There is stable aneurysmal disease of the ascending thoracic aorta.  Just above the sinotubular junction, the aorta reaches a maximal diameter of 4.6 - 4.7 cm.  Proximal arch measures 3.7 cm.  Descending thoracic aorta measures 3.0 cm.  There is no evidence of aneurysmal disease  at the level of the sinuses of Valsalva or valve plane.   Stable bovine anatomy of the great vessels.  No evidence of aortic dissection or rupture.  No evidence of intramural hemorrhage or mediastinal fluid collections.  No other incidental findings.   IMPRESSION: Stable aneurysmal dilatation of the proximal ascending thoracic aorta measuring 4.6 - 4.7 cm in greatest diameter.   Original Report Authenticated By: Reola Calkins, M.D.   Impression:  He has a stable fusiform ascending aortic aneurysm with a maximum diameter of 4.6-4.7 cm.  Plan:  I will plan to see him back in one year with a repeat MR angiogram of the chest.

## 2012-03-14 ENCOUNTER — Encounter (HOSPITAL_COMMUNITY): Payer: Self-pay | Admitting: Pharmacy Technician

## 2012-03-15 ENCOUNTER — Other Ambulatory Visit: Payer: Self-pay | Admitting: Interventional Cardiology

## 2012-03-18 ENCOUNTER — Other Ambulatory Visit: Payer: Self-pay

## 2012-03-18 ENCOUNTER — Encounter (HOSPITAL_COMMUNITY): Payer: Self-pay | Admitting: Certified Registered"

## 2012-03-18 ENCOUNTER — Ambulatory Visit (HOSPITAL_COMMUNITY): Payer: Medicare Other | Admitting: Certified Registered"

## 2012-03-18 ENCOUNTER — Ambulatory Visit (HOSPITAL_COMMUNITY)
Admission: RE | Admit: 2012-03-18 | Discharge: 2012-03-18 | Disposition: A | Discharge status: Home / Self Care | Payer: Medicare Other | Source: Ambulatory Visit | Attending: Interventional Cardiology | Admitting: Interventional Cardiology

## 2012-03-18 ENCOUNTER — Encounter (HOSPITAL_COMMUNITY)
Admission: RE | Disposition: A | Discharge status: Home / Self Care | Payer: Self-pay | Source: Ambulatory Visit | Attending: Interventional Cardiology

## 2012-03-18 DIAGNOSIS — I4891 Unspecified atrial fibrillation: Secondary | ICD-10-CM | POA: Insufficient documentation

## 2012-03-18 HISTORY — PX: CARDIOVERSION: SHX1299

## 2012-03-18 SURGERY — CARDIOVERSION
Anesthesia: Monitor Anesthesia Care | Wound class: Clean

## 2012-03-18 MED ORDER — PROPOFOL 10 MG/ML IV BOLUS
INTRAVENOUS | Status: DC | PRN
  Administered 2012-03-18: 120 mg via INTRAVENOUS

## 2012-03-18 MED ORDER — SODIUM CHLORIDE 0.9 % IV SOLN: 250.0000 mL | INTRAVENOUS | Status: DC

## 2012-03-18 MED ORDER — SODIUM CHLORIDE 0.9 % IV SOLN
INTRAVENOUS | Status: DC | PRN
  Administered 2012-03-18: 12:00:00 via INTRAVENOUS

## 2012-03-18 MED ORDER — HYDROCORTISONE 1 % EX CREA: 1.0000 "application " | TOPICAL_CREAM | Freq: Three times a day (TID) | CUTANEOUS | Status: DC | PRN

## 2012-03-18 MED ORDER — SODIUM CHLORIDE 0.9 % IJ SOLN: 3.0000 mL | Freq: Two times a day (BID) | INTRAMUSCULAR | Status: DC

## 2012-03-18 MED ORDER — SODIUM CHLORIDE 0.9 % IJ SOLN: 3.0000 mL | INTRAMUSCULAR | Status: DC | PRN

## 2012-03-18 MED ORDER — AMIODARONE HCL 200 MG PO TABS: 200.0000 mg | ORAL_TABLET | Freq: Every day | ORAL | Status: DC

## 2012-03-18 NOTE — Transfer of Care (Signed)
Immediate Anesthesia Transfer of Care Note  Patient: Joseph Blake Sr.  Procedure(s) Performed: Procedure(s) (LRB): CARDIOVERSION (N/A)  Patient Location: Short Stay  Anesthesia Type: General  Level of Consciousness: awake, alert , oriented and patient cooperative  Airway & Oxygen Therapy: Patient Spontanous Breathing and Patient connected to nasal cannula oxygen  Post-op Assessment: Report given to PACU RN and Post -op Vital signs reviewed and stable  Post vital signs: Reviewed and stable  Complications: No apparent anesthesia complications

## 2012-03-18 NOTE — Preoperative (Signed)
Beta Blockers   Reason not to administer Beta Blockers:Not Applicable 

## 2012-03-18 NOTE — CV Procedure (Signed)
Electrical Cardioversion Procedure Note Densel Kronick [id number] January 23, 1941  Procedure: Electrical Cardioversion Indications:  Atrial Fibrillation  Time Out: Verified patient identification, verified procedure,medications/allergies/relevent history reviewed, required imaging and test results available.  Performed  Procedure Details  The patient was NPO after midnight. Anesthesia was administered at the beside  by [facility] with 120mg  of propofol.  Cardioversion was done with synchronized biphasic defibrillation with AP pads with 200watts.  The patient converted to normal sinus rhythm. The patient tolerated the procedure well .  IMPRESSION:Successeful electrical cardioversion to NSR from atrial fibrillation.  PLAN: Continue the same meds.    Lesleigh Noe 03/18/2012, 1:21 PM

## 2012-03-18 NOTE — Anesthesia Postprocedure Evaluation (Signed)
Anesthesia Post Note  Patient: Joseph Blake Sr.  Procedure(s) Performed: Procedure(s) (LRB): CARDIOVERSION (N/A)  Anesthesia type: general  Patient location: PACU  Post pain: Pain level controlled  Post assessment: Patient's Cardiovascular Status Stable  Last Vitals:  Filed Vitals:   03/18/12 1438  BP: 122/69  Pulse: 49  Temp:   Resp: 18    Post vital signs: Reviewed and stable  Level of consciousness: sedated  Complications: No apparent anesthesia complications

## 2012-03-18 NOTE — CV Procedure (Addendum)
Presents for cardioversion. There have been no changes in his status and the procedure was reviewed and accepted by the patient.

## 2012-03-18 NOTE — Discharge Instructions (Signed)
Electrical Cardioversion Cardioversion is the delivery of a jolt of electricity to change the rhythm of the heart. Sticky patches or metal paddles are placed on the chest to deliver the electricity from a special device. This is done to restore a normal rhythm. A rhythm that is too fast or not regular keeps the heart from pumping well. Compared to medicines used to change an abnormal rhythm, cardioversion is faster and works better. It is also unpleasant and may dislodge blood clots from the heart. WHEN WOULD THIS BE DONE?  In an emergency:   There is low or no blood pressure as a result of the heart rhythm.   Normal rhythm must be restored as fast as possible to protect the brain and heart from further damage.   It may save a life.   For less serious heart rhythms, such as atrial fibrillation or flutter, in which:   The heart is beating too fast or is not regular.   The heart is still able to pump enough blood, but not as well as it should.   Medicine to change the rhythm has not worked.   It is safe to wait in order to allow time for preparation.  LET YOUR CAREGIVER KNOW ABOUT:   Every medicine you are taking. It is very important to do this! Know when to take or stop taking any of them.   Any time in the past that you have felt your heart was not beating normally.  RISKS AND COMPLICATIONS   Clots may form in the chambers of the heart if it is beating too fast. These clots may be dislodged during the procedure and travel to other parts of the body.   There is risk of a stroke during and after the procedure if a clot moves. Blood thinners lower this risk.   You may have a special test of your heart (TEE) to make sure there are no clots in your heart.  BEFORE THE PROCEDURE   You may have some tests to see how well your heart is working.   You may start taking blood thinners so your blood does not clot as easily.   Other drugs may be given to help your heart work better.   PROCEDURE (SCHEDULED)  The procedure is typically done in a hospital by a heart doctor (cardiologist).   You will be told when and where to go.   You may be given some medicine through an intravenous (IV) access to reduce discomfort and make you sleepy before the procedure.   Your whole body may move when the shock is delivered. Your chest may feel sore.   You may be able to go home after a few hours. Your heart rhythm will be watched to make sure it does not change.  HOME CARE INSTRUCTIONS   Only take medicine as directed by your caregiver. Be sure you understand how and when to take your medicine.   Learn how to feel your pulse and check it often.   Limit your activity for 48 hours.   Avoid caffeine and other stimulants as directed.  SEEK MEDICAL CARE IF:   You feel like your heart is beating too fast or your pulse is not regular.   You have any questions about your medicines.   You have bleeding that will not stop.  SEEK IMMEDIATE MEDICAL CARE IF:   You are dizzy or feel faint.   It is hard to breathe or you feel short of breath.     There is a change in discomfort in your chest.   Your speech is slurred or you have trouble moving your arm or leg on one side.   You get a muscle cramp.   Your fingers or toes turn cold or blue.  MAKE SURE YOU:   Understand these instructions.   Will watch your condition.   Will get help right away if you are not doing well or get worse.  Document Released: 12/04/2002 Document Revised: 12/03/2011 Document Reviewed: 04/04/2008 [employer] Patient Information 2012 [employer]. 

## 2012-03-18 NOTE — Anesthesia Preprocedure Evaluation (Addendum)
Anesthesia Evaluation  Patient identified by MRN, date of birth, ID band Patient awake    Reviewed: Allergy & Precautions, H&P , NPO status , Patient's Chart, lab work & pertinent test results, reviewed documented beta blocker date and time   History of Anesthesia Complications Negative for: history of anesthetic complications  Airway Mallampati: II TM Distance: >3 FB Neck ROM: Full    Dental  (+) Teeth Intact and Dental Advisory Given   Pulmonary neg pulmonary ROS,          Cardiovascular hypertension, Pt. on medications and Pt. on home beta blockers + CAD and + DOE + dysrhythmias Atrial Fibrillation Rhythm:Irregular Rate:Normal  Thoracic aneurysm. 4.8cm--stable, has not changed in 4 years. Being followed by Dr Laneta Simmers.    Neuro/Psych    GI/Hepatic Neg liver ROS, GERD-  Medicated and Controlled,  Endo/Other  negative endocrine ROS  Renal/GU negative Renal ROS     Musculoskeletal  (+) Arthritis -, Osteoarthritis,    Abdominal (+) + obese,   Peds  Hematology Anticoagulant therapy   Anesthesia Other Findings   Reproductive/Obstetrics                           Anesthesia Physical Anesthesia Plan  ASA: III  Anesthesia Plan: General   Post-op Pain Management:    Induction: Intravenous  Airway Management Planned: Mask  Additional Equipment:   Intra-op Plan:   Post-operative Plan:   Informed Consent: I have reviewed the patients History and Physical, chart, labs and discussed the procedure including the risks, benefits and alternatives for the proposed anesthesia with the patient or authorized representative who has indicated his/her understanding and acceptance.     Plan Discussed with: CRNA and Surgeon  Anesthesia Plan Comments:         Anesthesia Quick Evaluation

## 2012-03-21 ENCOUNTER — Encounter (HOSPITAL_COMMUNITY): Payer: Self-pay | Admitting: Interventional Cardiology

## 2012-07-06 HISTORY — PX: OTHER SURGICAL HISTORY: SHX169

## 2013-01-20 ENCOUNTER — Other Ambulatory Visit: Payer: Self-pay | Admitting: *Deleted

## 2013-01-20 DIAGNOSIS — I712 Thoracic aortic aneurysm, without rupture, unspecified: Secondary | ICD-10-CM

## 2013-02-20 ENCOUNTER — Ambulatory Visit
Admission: RE | Admit: 2013-02-20 | Discharge: 2013-02-20 | Disposition: A | Discharge status: Home / Self Care | Payer: 59 | Source: Ambulatory Visit | Attending: Surgery | Admitting: Surgery

## 2013-02-20 DIAGNOSIS — I712 Thoracic aortic aneurysm, without rupture, unspecified: Secondary | ICD-10-CM

## 2013-02-20 MED ORDER — GADOBENATE DIMEGLUMINE 529 MG/ML IV SOLN
20.0000 mL | Freq: Once | INTRAVENOUS | Status: AC | PRN
  Administered 2013-02-20: 20 mL via INTRAVENOUS

## 2013-02-21 ENCOUNTER — Ambulatory Visit (INDEPENDENT_AMBULATORY_CARE_PROVIDER_SITE_OTHER): Payer: 59 | Admitting: Surgery

## 2013-02-21 ENCOUNTER — Encounter: Payer: Self-pay | Admitting: Surgery

## 2013-02-21 VITALS — BP 150/88 | HR 56 | Resp 18 | Ht 69.0 in | Wt 272.0 lb

## 2013-02-21 DIAGNOSIS — I712 Thoracic aortic aneurysm, without rupture, unspecified: Secondary | ICD-10-CM

## 2013-02-21 NOTE — Progress Notes (Signed)
[address]       [place] [address]             [phone]      HPI:  The patient returns today for followup of an ascending aortic aneurysm. I last saw him 02/09/2012 at which time a MR angiogram showed that there was no change in the size of the fusiform ascending aortic aneurysm with a maximum diameter of about 4.8 cm. He said that about 8 months ago he underwent an ablation for recurrent atrial fibrillation by Dr. Chales Abrahams and has maintained sinus rhythm since. He has noticed a marked improvement in his exertional tolerance since then. He recently took a trip to the [place] and did not have any problems. He denies any chest or back pain.     Current Outpatient Prescriptions  Medication Sig Dispense Refill  . ALPRAZolam (XANAX) 0.25 MG tablet Take 0.25 mg by mouth 4 (four) times daily as needed. For nerves      . amiodarone (PACERONE) 200 MG tablet Take 200 mg by mouth daily. TAKES 100 MG TAB ON MON/WED/FRI      . Azelaic Acid (FINACEA) 15 % cream Apply topically daily. After skin is thoroughly washed and patted dry, gently but thoroughly massage a thin film of azelaic acid cream into the affected area twice daily, in the morning and evening.      Marland Kitchen buPROPion (WELLBUTRIN SR) 150 MG 12 hr tablet Take 150 mg by mouth daily.      . halobetasol (ULTRAVATE) 0.05 % cream Apply topically 2 (two) times daily.      . hydrochlorothiazide (HYDRODIURIL) 25 MG tablet Take 25 mg by mouth daily. TAKES 25 MG TAB EVERY MON/WED/FRI      . metoprolol (LOPRESSOR) 100 MG tablet Take 100 mg by mouth daily. Take 100mg  in the am and 50mg  in the evening      . Multiple Vitamin (MULTIVITAMIN) tablet Take 1 tablet by mouth daily.        Marland Kitchen olmesartan (BENICAR) 20 MG tablet Take 20 mg by mouth daily.       . pantoprazole (PROTONIX) 40 MG tablet Take 40 mg by mouth daily.      . Rivaroxaban (XARELTO) 20 MG TABS Take 20 mg by mouth daily.       . rosuvastatin (CRESTOR) 10 MG tablet Take 10 mg  by mouth once a week. Saturday      . zolpidem (AMBIEN) 10 MG tablet Take 10 mg by mouth at bedtime as needed. For sleep      . [DISCONTINUED] famotidine (PEPCID AC) 10 MG chewable tablet Chew 10 mg by mouth as needed.         Current Facility-Administered Medications  Medication Dose Route Frequency Provider Last Rate Last Dose  . 0.9 %  sodium chloride infusion  500 mL Intravenous Continuous Louis Meckel, MD         Physical Exam: BP 150/88  Pulse 56  Resp 18  Ht 5\' 9"  (1.753 m)  Wt 272 lb (123.378 kg)  BMI 40.15 kg/m2  SpO2 98% He looks well. Cardiac exam shows a regular rate and rhythm with normal heart sounds. There is no murmur. His lung exam is clear.  Diagnostic Tests:  *RADIOLOGY REPORT*   Clinical Data: Ascending aortic aneurysm, 1-year follow-up exam   MRA CHEST WITH OR WITHOUT CONTRAST   BUN and creatinine were obtained on site at [facility] at [address]  Results:  BUN 8 mg/dL,  Creatinine 1.1 mg/dL, GFR 60.   Contrast: 20mL MULTIHANCE GADOBENATE DIMEGLUMINE 529 MG/ML IV SOLN   Comparison: Prior MRI a chest 02/09/2012.   Findings:   There is a bovine configuration of the aortic arch (two vessel arch with common origin of the brachiocephalic and left common carotid arteries), a normal anatomic variant.  The fusiform aneurysmal dilatation of the ascending thoracic aortic segment remains stable. The proximal ascending thoracic aorta again measures 4.6 cm just distal to the sinotubular junction.  The proximal transverse aorta measures 3.8 cm in diameter in the distal thoracic aorta measures 3.1 cm in diameter.  The aorta measures 4.3 cm at the sinuses of Valsalva.   No evidence of acute or chronic dissection.  No intramural hemorrhage or mediastinal fluid collections.  No pleural effusion, enhancing of pulmonary nodule or other incidental abnormality.   IMPRESSION: Stable aneurysmal dilatation of the ascending thoracic aorta to  4.6 cm in greatest diameter.     Original Report Authenticated By: Malachy Moan, M.D.   Impression:  He has a stable fusiform ascending aortic aneurysm that measures 4.6 cm on his current study. It was measured at 4.8 cm on his study one year ago. I think this is unchanged and within the limits of measurement on this study. I reassured him that there is no reason for surgical treatment at this time. I reviewed the indications for surgery including growth of more than 1 cm over a year or reaching 5.5 cm in maximum diameter. I also stressed the importance of good blood pressure control with use of a beta blocker, which he is on.  Plan:  I'll see him back in one year with a MR angiogram of the chest to reassess his aortic aneurysm.

## 2013-08-02 ENCOUNTER — Other Ambulatory Visit: Payer: Self-pay

## 2013-09-12 ENCOUNTER — Other Ambulatory Visit: Payer: Self-pay | Admitting: Internal Medicine

## 2013-09-12 DIAGNOSIS — H905 Unspecified sensorineural hearing loss: Secondary | ICD-10-CM

## 2013-09-12 DIAGNOSIS — H903 Sensorineural hearing loss, bilateral: Secondary | ICD-10-CM

## 2013-09-12 DIAGNOSIS — H9319 Tinnitus, unspecified ear: Secondary | ICD-10-CM

## 2013-09-20 ENCOUNTER — Ambulatory Visit
Admission: RE | Admit: 2013-09-20 | Discharge: 2013-09-20 | Disposition: A | Discharge status: Home / Self Care | Payer: Medicare Other | Source: Ambulatory Visit | Attending: Internal Medicine | Admitting: Internal Medicine

## 2013-09-20 ENCOUNTER — Ambulatory Visit
Admission: RE | Admit: 2013-09-20 | Discharge: 2013-09-20 | Disposition: A | Discharge status: Home / Self Care | Payer: 59 | Source: Ambulatory Visit | Attending: Internal Medicine | Admitting: Internal Medicine

## 2013-09-20 DIAGNOSIS — H905 Unspecified sensorineural hearing loss: Secondary | ICD-10-CM

## 2013-09-20 DIAGNOSIS — H903 Sensorineural hearing loss, bilateral: Secondary | ICD-10-CM

## 2013-09-20 DIAGNOSIS — H9319 Tinnitus, unspecified ear: Secondary | ICD-10-CM

## 2013-09-20 MED ORDER — GADOBENATE DIMEGLUMINE 529 MG/ML IV SOLN
20.0000 mL | Freq: Once | INTRAVENOUS | Status: AC | PRN
  Administered 2013-09-20: 20 mL via INTRAVENOUS

## 2013-09-30 ENCOUNTER — Emergency Department (HOSPITAL_COMMUNITY): Payer: Medicare Other

## 2013-09-30 ENCOUNTER — Encounter (HOSPITAL_COMMUNITY): Payer: Self-pay | Admitting: *Deleted

## 2013-09-30 ENCOUNTER — Emergency Department (HOSPITAL_COMMUNITY)
Admission: EM | Admit: 2013-09-30 | Discharge: 2013-09-30 | Disposition: A | Discharge status: Home / Self Care | Payer: Medicare Other | Attending: Emergency Medicine | Admitting: Emergency Medicine

## 2013-09-30 ENCOUNTER — Other Ambulatory Visit: Payer: Self-pay

## 2013-09-30 DIAGNOSIS — I4891 Unspecified atrial fibrillation: Secondary | ICD-10-CM

## 2013-09-30 DIAGNOSIS — E785 Hyperlipidemia, unspecified: Secondary | ICD-10-CM | POA: Insufficient documentation

## 2013-09-30 DIAGNOSIS — F329 Major depressive disorder, single episode, unspecified: Secondary | ICD-10-CM | POA: Insufficient documentation

## 2013-09-30 DIAGNOSIS — Z7982 Long term (current) use of aspirin: Secondary | ICD-10-CM | POA: Insufficient documentation

## 2013-09-30 DIAGNOSIS — Z79899 Other long term (current) drug therapy: Secondary | ICD-10-CM | POA: Insufficient documentation

## 2013-09-30 DIAGNOSIS — F410 Panic disorder [episodic paroxysmal anxiety] without agoraphobia: Secondary | ICD-10-CM | POA: Insufficient documentation

## 2013-09-30 DIAGNOSIS — Z860100 Personal history of colon polyps, unspecified: Secondary | ICD-10-CM | POA: Insufficient documentation

## 2013-09-30 DIAGNOSIS — Z9861 Coronary angioplasty status: Secondary | ICD-10-CM | POA: Insufficient documentation

## 2013-09-30 DIAGNOSIS — K219 Gastro-esophageal reflux disease without esophagitis: Secondary | ICD-10-CM | POA: Insufficient documentation

## 2013-09-30 DIAGNOSIS — I10 Essential (primary) hypertension: Secondary | ICD-10-CM | POA: Insufficient documentation

## 2013-09-30 DIAGNOSIS — R002 Palpitations: Secondary | ICD-10-CM

## 2013-09-30 DIAGNOSIS — F3289 Other specified depressive episodes: Secondary | ICD-10-CM | POA: Insufficient documentation

## 2013-09-30 LAB — MAGNESIUM: Magnesium: 2.2 mg/dL (ref 1.5–2.5)

## 2013-09-30 LAB — CBC WITH DIFFERENTIAL/PLATELET
Basophils Absolute: 0 10*3/uL (ref 0.0–0.1)
Basophils Relative: 1 % (ref 0–1)
Eosinophils Relative: 5 % (ref 0–5)
HCT: 44.1 % (ref 39.0–52.0)
Lymphs Abs: 1.3 10*3/uL (ref 0.7–4.0)
MCHC: 35.8 g/dL (ref 30.0–36.0)
Monocytes Absolute: 0.9 10*3/uL (ref 0.1–1.0)
Neutro Abs: 4.3 10*3/uL (ref 1.7–7.7)
RBC: 5.26 MIL/uL (ref 4.22–5.81)
RDW: 12.7 % (ref 11.5–15.5)
WBC: 6.9 10*3/uL (ref 4.0–10.5)

## 2013-09-30 LAB — COMPREHENSIVE METABOLIC PANEL
AST: 32 U/L (ref 0–37)
Albumin: 4.1 g/dL (ref 3.5–5.2)
Calcium: 8.9 mg/dL (ref 8.4–10.5)
Chloride: 93 mEq/L — ABNORMAL LOW (ref 96–112)
Creatinine, Ser: 1.07 mg/dL (ref 0.50–1.35)
Glucose, Bld: 129 mg/dL — ABNORMAL HIGH (ref 70–99)
Total Protein: 7 g/dL (ref 6.0–8.3)

## 2013-09-30 LAB — POCT I-STAT TROPONIN I

## 2013-09-30 MED ORDER — POTASSIUM CHLORIDE CRYS ER 20 MEQ PO TBCR
40.0000 meq | EXTENDED_RELEASE_TABLET | Freq: Once | ORAL | Status: AC
  Administered 2013-09-30: 40 meq via ORAL
  Filled 2013-09-30: qty 2

## 2013-09-30 MED ORDER — SODIUM CHLORIDE 0.9 % IV BOLUS (SEPSIS)
500.0000 mL | Freq: Once | INTRAVENOUS | Status: AC
  Administered 2013-09-30: 500 mL via INTRAVENOUS

## 2013-09-30 NOTE — ED Notes (Signed)
Pt. Woke up feeling 09/29/13  his heart racing. Hx. Of afib and ablation. Pt. Off amiodarone for 6 mos. Took his 100 mg lopressor. Was going to take an xtra dose if unresolved.

## 2013-09-30 NOTE — ED Notes (Signed)
The pt woke up approx one hour ago with a fast heart rate,  Hx af.  He had a procedure 15 months ago for the same and he has not had this until tonight.  No pain

## 2013-09-30 NOTE — ED Provider Notes (Signed)
CSN: [id number]     Arrival date & time 09/30/13  4098 History   First MD Initiated Contact with Patient 09/30/13 [phone]     Chief Complaint  Patient presents with  . Tachycardia   (Consider location/radiation/quality/duration/timing/severity/associated sxs/prior Treatment) HPI Comments: 72 yo male with a fib, thoracic aneurysm presents with palpitations since 130 am today.  Pt had ablation last year in [facility] and has been fine since.  Pt was taken off amio and xarelto because he was in normal sinus and feeling fine.  No cp.  Intermittent palpitations.  No sob.  Pt on asa. Improves with time.   The history is provided by the patient.    Past Medical History  Diagnosis Date  . Depression   . Headache(784.0)   . GERD (gastroesophageal reflux disease)   . Allergy   . Hypertension   . Hyperlipidemia   . Thoracic aneurysm   . Colon polyp   . Atrial fibrillation   . Panic attacks   . Diverticulosis    Past Surgical History  Procedure Laterality Date  . Appendectomy  1958  . Cardiac catheterization  2009  . Inguinal hernia repair      right  . Vasectomy    . Cardioversion  03/18/2012    Procedure: CARDIOVERSION;  Surgeon: Lesleigh Noe, MD;  Location: [facility] OR;  Service: Cardiovascular;  Laterality: N/A;   Family History  Problem Relation Age of Onset  . Heart disease Mother 64    mother  . Cancer Father     lung  . Heart disease Father   . Kidney disease Brother 30    died age 56 heart attack  . Heart disease Maternal Grandfather    History  Substance Use Topics  . Smoking status: Never Smoker   . Smokeless tobacco: Never Used  . Alcohol Use: No    Review of Systems  Constitutional: Negative for fever and chills.  HENT: Negative for neck pain and neck stiffness.   Eyes: Negative for visual disturbance.  Respiratory: Negative for shortness of breath.   Cardiovascular: Positive for palpitations. Negative for chest pain and leg swelling.  Gastrointestinal:  Positive for nausea. Negative for vomiting and abdominal pain.  Genitourinary: Negative for dysuria and flank pain.  Musculoskeletal: Negative for back pain.  Skin: Negative for rash.  Neurological: Negative for light-headedness and headaches.    Allergies  Review of patient's allergies indicates no known allergies.  Home Medications   Current Outpatient Rx  Name  Route  Sig  Dispense  Refill  . ALPRAZolam (XANAX) 0.5 MG tablet   Oral   Take 0.5-1 mg by mouth 3 (three) times daily as needed for sleep.         Marland Kitchen amoxicillin-clavulanate (AUGMENTIN) 875-125 MG per tablet   Oral   Take 1 tablet by mouth 2 (two) times daily. Started 09/18/13, for 14 days, ending 09/30/13         . aspirin EC 325 MG tablet   Oral   Take 325 mg by mouth daily.         . hydrochlorothiazide (HYDRODIURIL) 25 MG tablet   Oral   Take 25 mg by mouth daily.          . metoprolol succinate (TOPROL-XL) 100 MG 24 hr tablet   Oral   Take 100 mg by mouth daily. Take with or immediately following a meal.         . olmesartan (BENICAR) 40 MG tablet  Oral   Take 40 mg by mouth daily.         . pantoprazole (PROTONIX) 40 MG tablet   Oral   Take 40 mg by mouth daily.         . sertraline (ZOLOFT) 100 MG tablet   Oral   Take 50 mg by mouth daily.         Marland Kitchen zolpidem (AMBIEN) 10 MG tablet   Oral   Take 10 mg by mouth at bedtime as needed. For sleep         . ALPRAZolam (XANAX) 0.25 MG tablet   Oral   Take 0.25 mg by mouth 4 (four) times daily as needed. For nerves         . amiodarone (PACERONE) 200 MG tablet   Oral   Take 200 mg by mouth daily. TAKES 100 MG TAB ON MON/WED/FRI         . Azelaic Acid (FINACEA) 15 % cream   Topical   Apply topically daily. After skin is thoroughly washed and patted dry, gently but thoroughly massage a thin film of azelaic acid cream into the affected area twice daily, in the morning and evening.         Marland Kitchen buPROPion (WELLBUTRIN SR) 150 MG 12 hr  tablet   Oral   Take 150 mg by mouth daily.         . halobetasol (ULTRAVATE) 0.05 % cream   Topical   Apply topically 2 (two) times daily.         . metoprolol (LOPRESSOR) 100 MG tablet   Oral   Take 100 mg by mouth daily. Take 100mg  in the am and 50mg  in the evening         . Multiple Vitamin (MULTIVITAMIN) tablet   Oral   Take 1 tablet by mouth daily.           Marland Kitchen olmesartan (BENICAR) 20 MG tablet   Oral   Take 20 mg by mouth daily.          . Rivaroxaban (XARELTO) 20 MG TABS   Oral   Take 20 mg by mouth daily.          . rosuvastatin (CRESTOR) 10 MG tablet   Oral   Take 10 mg by mouth once a week. Saturday          BP 115/89  Pulse 96  Temp(Src) 97.6 F (36.4 C)  Resp 18  Ht 5\' 10"  (1.778 m)  Wt 282 lb (127.914 kg)  BMI 40.46 kg/m2  SpO2 98% Physical Exam  Nursing note and vitals reviewed. Constitutional: He is oriented to person, place, and time. He appears well-developed and well-nourished.  HENT:  Head: Normocephalic and atraumatic.  Eyes: Conjunctivae are normal. Right eye exhibits no discharge. Left eye exhibits no discharge.  Neck: Normal range of motion. Neck supple. No tracheal deviation present.  Cardiovascular: An irregularly irregular rhythm present. Tachycardia present.   Pulses:      Radial pulses are 2+ on the right side, and 2+ on the left side.  Pulmonary/Chest: Effort normal and breath sounds normal.  Abdominal: Soft. He exhibits no distension. There is no tenderness. There is no guarding.  Musculoskeletal: He exhibits no edema.  Neurological: He is alert and oriented to person, place, and time.  Skin: Skin is warm. No rash noted.  Psychiatric: He has a normal mood and affect.    ED Course  Procedures (including critical care time) Labs Review  Labs Reviewed  CBC WITH DIFFERENTIAL - Abnormal; Notable for the following:    Monocytes Relative 13 (*)    All other components within normal limits  COMPREHENSIVE METABOLIC PANEL  - Abnormal; Notable for the following:    Sodium 133 (*)    Potassium 3.4 (*)    Chloride 93 (*)    Glucose, Bld 129 (*)    GFR calc non Af Amer 67 (*)    GFR calc Af Amer 78 (*)    All other components within normal limits  MAGNESIUM  POCT I-STAT TROPONIN I   Imaging Review No results found.  MDM  No diagnosis found. Pt in a fib with mild RVR.  Mild symptomatic.  K mild low, po given.  Rechecked, comfortable, minimal sxs.  Pt okay with close outpt fup.  I recommended resuming xarelto and doubling metoprolol. Will discuss with cardio to check for fup/ plan on Monday.   Date: 09/30/2013  Rate: 108  Rhythm: atrial fibrillation  QRS Axis: indeterminate  Intervals: a fib  ST/T Wave abnormalities: nonspecific ST changes  Conduction Disutrbances:incomplete RBBB  Narrative Interpretation:   Old EKG Reviewed: changes noted  Dg Chest 2 View  09/30/2013   *RADIOLOGY REPORT*  Clinical Data: Tachycardia.  CHEST - 2 VIEW  Comparison: Chest radiograph report July 05, 2012; images are not available for direct comparison.  Findings: The cardiac silhouette is upper limits of normal in size, mediastinal silhouette is unremarkable.  Lungs are clear without pleural effusions or focal consolidations.  Pulmonary vasculature is unremarkable.  Trachea projects midline and there is no pneumothorax.  Soft tissue planes and included osseous structures are not suspicious.  IMPRESSION: Borderline cardiomegaly, no acute pulmonary process.   Original Report Authenticated By: Awilda Metro   Observed in ED, hr normalized with fluids and time. Spoke with cardiology on call, he agrees with xarelto and doubling metoprolol See cardiology on Monday.   Enid Skeens, MD 09/30/13 [phone]

## 2013-10-04 ENCOUNTER — Ambulatory Visit (INDEPENDENT_AMBULATORY_CARE_PROVIDER_SITE_OTHER): Payer: 59 | Admitting: Interventional Cardiology

## 2013-10-04 ENCOUNTER — Encounter: Payer: Self-pay | Admitting: Interventional Cardiology

## 2013-10-04 VITALS — BP 119/71 | HR 71 | Ht 70.0 in | Wt 276.0 lb

## 2013-10-04 DIAGNOSIS — I712 Thoracic aortic aneurysm, without rupture, unspecified: Secondary | ICD-10-CM

## 2013-10-04 DIAGNOSIS — IMO0001 Reserved for inherently not codable concepts without codable children: Secondary | ICD-10-CM

## 2013-10-04 DIAGNOSIS — I10 Essential (primary) hypertension: Secondary | ICD-10-CM

## 2013-10-04 DIAGNOSIS — Z7901 Long term (current) use of anticoagulants: Secondary | ICD-10-CM

## 2013-10-04 DIAGNOSIS — I4891 Unspecified atrial fibrillation: Secondary | ICD-10-CM

## 2013-10-04 DIAGNOSIS — E876 Hypokalemia: Secondary | ICD-10-CM

## 2013-10-04 MED ORDER — RIVAROXABAN 20 MG PO TABS: 20.0000 mg | ORAL_TABLET | Freq: Every day | ORAL | Status: DC

## 2013-10-04 MED ORDER — POTASSIUM CHLORIDE CRYS ER 20 MEQ PO TBCR: 20.0000 meq | EXTENDED_RELEASE_TABLET | Freq: Every day | ORAL | Status: DC

## 2013-10-04 NOTE — Patient Instructions (Addendum)
Start Xarelto 20mg  daily  Start Potassium Daily  Please schedule an appt with our Coumadin clinic to discuss starting Xarelto  Your physician recommends that you schedule a follow-up appointment in: in 6 months  Your appt is scheduled for 03/29/14 @2pm  with Dr.Smith

## 2013-10-04 NOTE — Progress Notes (Signed)
Patient ID: Joseph [name], male   DOB: 24-Jan-1941, 72 y.o.   MRN: [id number]    HPI   24 hour history of atrial fibrillation occurred this weekend. Resumed anticoagulation therapy and used metoprolol, and amiodarone. Had spontaneous conversion at home. He went to the emergency room, where heart rate was greater than 140. He knew he was in atrial fibrillation, and, therefore reported to the ER. He had no cardiopulmonary complaints. There is no chest discomfort, dyspnea, syncope, or transient neurological symptoms. No bleeding since resuming Xarelto. The patient does not want to be on chronic anticoagulation or amiodarone. These medications were stopped earlier this year. He feels well today.  No Known Allergies  Current Outpatient Prescriptions  Medication Sig Dispense Refill  . acetaminophen (TYLENOL) 500 MG tablet Take 500 mg by mouth every 4 (four) hours as needed for pain (stops at 2500mg  daily).      . ALPRAZolam (XANAX) 0.5 MG tablet Take 0.5-1 mg by mouth 3 (three) times daily as needed for sleep.      Marland Kitchen aspirin EC 325 MG tablet Take 325 mg by mouth daily.      . Azelaic Acid (FINACEA) 15 % cream Apply topically daily. After skin is thoroughly washed and patted dry, gently but thoroughly massage a thin film of azelaic acid cream into the affected area twice daily, in the morning and evening.      . beta carotene w/minerals (OCUVITE) tablet Take 1 tablet by mouth daily.      . Coenzyme Q10 (COQ10) 100 MG CAPS Take 300 mg by mouth daily.       . Coenzyme Q10 300 MG CAPS Take by mouth.      . fluorouracil (EFUDEX) 5 % cream       . glucose 4 GM chewable tablet Chew 16 g by mouth as needed for low blood sugar.      . halobetasol (ULTRAVATE) 0.05 % cream Apply 1 application topically daily.       . hydrochlorothiazide (HYDRODIURIL) 25 MG tablet Take 25 mg by mouth daily.       . metoprolol succinate (TOPROL-XL) 100 MG 24 hr tablet Take 100 mg by mouth daily. Take with or immediately following  a meal.      . olmesartan (BENICAR) 40 MG tablet Take 40 mg by mouth daily.      . pantoprazole (PROTONIX) 40 MG tablet Take 40 mg by mouth daily.      . Potassium Gluconate 595 MG CAPS Take by mouth.      . psyllium (METAMUCIL) 58.6 % powder Take 1 packet by mouth 3 (three) times daily.      . psyllium (REGULOID) 0.52 G capsule Take 0.52 g by mouth daily.      . sertraline (ZOLOFT) 100 MG tablet Take 50 mg by mouth daily.      Marland Kitchen zolpidem (AMBIEN) 10 MG tablet Take 5 mg by mouth daily as needed for sleep. For sleep      . [DISCONTINUED] famotidine (PEPCID AC) 10 MG chewable tablet Chew 10 mg by mouth as needed.         Current Facility-Administered Medications  Medication Dose Route Frequency Provider Last Rate Last Dose  . 0.9 %  sodium chloride infusion  500 mL Intravenous Continuous Louis Meckel, MD        Past Medical History  Diagnosis Date  . Depression   . Headache(784.0)   . GERD (gastroesophageal reflux disease)   . Allergy   .  Hypertension   . Hyperlipidemia   . Thoracic aneurysm   . Colon polyp   . Atrial fibrillation   . Panic attacks   . Diverticulosis     Past Surgical History  Procedure Laterality Date  . Appendectomy  1958  . Cardiac catheterization  2009  . Inguinal hernia repair      right  . Vasectomy    . Cardioversion  03/18/2012    Procedure: CARDIOVERSION;  Surgeon: Lesleigh Noe, MD;  Location: [facility] OR;  Service: Cardiovascular;  Laterality: N/A;    ROS: No neurological symptoms. Denies syncope. Has not had palpitations. Since Sunday. PHYSICAL EXAM BP 119/71  Pulse 71  Ht 5\' 10"  (1.778 m)  Wt 276 lb (125.193 kg)  BMI 39.6 kg/m2 Chest is clear. Cardiac exam reveals an S4 gallop otherwise, unremarkable No peripheral edema.   EKG: Normal sinus rhythm with incomplete right bundle branch block and poor R-wave progression  ASSESSMENT AND PLAN  1. Paroxysmal atrial fibrillation, with this particular episode, lasting greater than 24 hours  before spontaneous reversion. History of atrial fib ablation.  2. Mild hypokalemia while in the ER, will require chronic therapy.  3. Hypertension, controlled.  4. Anticoagulation will be resumed in definitely given the patient's risk profile.Xarelto will be resumed. He will followup in the anticoagulation clinic, as needed.  Overall, the patient is back to baseline. He is status post atrial fibrillation ablation. Last episode of atrial fib occurred 18 months ago. This most recent episode lasted 18-24 hours. He is resumed anticoagulation without bleeding. He resists antiarrhythmic therapy. At this point. We will play, rhythm control by year. If he has to episodes we will probably not start antiarrhythmic therapy. If he has frequent recurrences. He would even need medication suppression or repeat ablation.Marland Kitchen

## 2013-10-11 ENCOUNTER — Other Ambulatory Visit: Payer: Self-pay

## 2013-10-11 MED ORDER — METOPROLOL SUCCINATE ER 100 MG PO TB24: 100.0000 mg | ORAL_TABLET | Freq: Every day | ORAL | Status: DC

## 2013-11-02 ENCOUNTER — Other Ambulatory Visit: Payer: Self-pay

## 2014-01-29 ENCOUNTER — Other Ambulatory Visit: Payer: Self-pay

## 2014-01-29 DIAGNOSIS — I712 Thoracic aortic aneurysm, without rupture, unspecified: Secondary | ICD-10-CM

## 2014-02-28 ENCOUNTER — Ambulatory Visit (INDEPENDENT_AMBULATORY_CARE_PROVIDER_SITE_OTHER): Payer: Medicare Other | Admitting: Surgery

## 2014-02-28 ENCOUNTER — Ambulatory Visit
Admission: RE | Admit: 2014-02-28 | Discharge: 2014-02-28 | Disposition: A | Discharge status: Home / Self Care | Payer: Medicare Other | Source: Ambulatory Visit | Attending: Surgery | Admitting: Surgery

## 2014-02-28 ENCOUNTER — Encounter: Payer: Self-pay | Admitting: Surgery

## 2014-02-28 VITALS — BP 138/83 | HR 64 | Resp 16 | Ht 69.0 in | Wt 267.0 lb

## 2014-02-28 DIAGNOSIS — I712 Thoracic aortic aneurysm, without rupture, unspecified: Secondary | ICD-10-CM

## 2014-02-28 MED ORDER — GADOBENATE DIMEGLUMINE 529 MG/ML IV SOLN
20.0000 mL | Freq: Once | INTRAVENOUS | Status: AC | PRN
  Administered 2014-02-28: 20 mL via INTRAVENOUS

## 2014-02-28 NOTE — Progress Notes (Signed)
HPI:  The patient returns today for followup of an ascending aortic aneurysm. I last saw him 02/21/2013 at which time a MR angiogram showed that there was no change in the size of the fusiform ascending aortic aneurysm with a maximum diameter of about 4.6 cm. It had been measured at 4.8 cm on the prior scan. Over the past year he has had no chest pain or dyspnea. He is having a complex of neurological symptoms now that are being worked up by neurology and it is felt that he may have intracranial HTN.    Current Outpatient Prescriptions  Medication Sig Dispense Refill  . acetaminophen (TYLENOL) 500 MG tablet Take 500 mg by mouth every 4 (four) hours as needed for pain (stops at 2500mg  daily).      Marland Kitchen acetaZOLAMIDE (DIAMOX) 125 MG tablet Take 125 mg by mouth 2 (two) times daily.      Marland Kitchen ALPRAZolam (XANAX) 0.5 MG tablet Take 0.5-1 mg by mouth 3 (three) times daily as needed for sleep.      . Azelaic Acid (FINACEA) 15 % cream Apply topically daily. After skin is thoroughly washed and patted dry, gently but thoroughly massage a thin film of azelaic acid cream into the affected area twice daily, in the morning and evening.      . fluorouracil (EFUDEX) 5 % cream       . halobetasol (ULTRAVATE) 0.05 % cream Apply 1 application topically daily.       . metoprolol succinate (TOPROL-XL) 100 MG 24 hr tablet Take 1 tablet (100 mg total) by mouth daily. Take with or immediately following a meal.  30 tablet  6  . olmesartan (BENICAR) 40 MG tablet Take 40 mg by mouth daily.      . pantoprazole (PROTONIX) 40 MG tablet Take 40 mg by mouth daily.      . potassium chloride SA (K-DUR,KLOR-CON) 20 MEQ tablet Take 1 tablet (20 mEq total) by mouth daily.  90 tablet  3  . Rivaroxaban (XARELTO) 20 MG TABS tablet Take 1 tablet (20 mg total) by mouth daily.  90 tablet  3  . zolpidem (AMBIEN) 10 MG tablet Take 5 mg by mouth daily as needed for sleep. For sleep      . [DISCONTINUED] famotidine (PEPCID AC) 10 MG chewable  tablet Chew 10 mg by mouth as needed.         No current facility-administered medications for this visit.     Physical Exam: BP 138/83  Pulse 64  Resp 16  Ht 5\' 9"  (1.753 m)  Wt 267 lb (121.11 kg)  BMI 39.41 kg/m2  SpO2 98% He looks well.  Cardiac exam shows a regular rate and rhythm with normal heart sounds. There is no murmur.  His lung exam is clear.   Diagnostic Tests:  CLINICAL DATA: Followup of ascending thoracic aortic aneurysm.  EXAM:  MRA CHEST WITH OR WITHOUT CONTRAST  TECHNIQUE:  Angiographic images of the chest were obtained using MRA technique  without and with intravenous contrast.  CONTRAST: 65mL MULTIHANCE GADOBENATE DIMEGLUMINE 529 MG/ML IV SOLN  BUN and creatinine were obtained on site at [facility] at  [address]  Results: BUN 11 mg/dL, Creatinine 1.1 mg/dL, estimated GFR 66  mL/minute.  COMPARISON: MR MRA CHEST WO/W CM dated 02/20/2013; MR MRA CHEST WO/W  CM dated 02/09/2012; MR MRA CHEST WO/W CM dated 02/03/2011; MR MRA  CHEST WO/W CM dated 01/20/2010; CT ANGIO CHEST W/CM &/OR WO/CM  dated 12/17/2008  FINDINGS:  There is stable aneurysmal dilatation of the proximal ascending  thoracic aorta just distal to the sino-tubular junction. Maximal  diameter is approximately 4.7-4.8 cm. No dilatation is identified at  the level of the sinuses of Valsalva. Proximal arch measures 3.9 cm.  Descending thoracic aorta measures 3.1 cm. There remains no evidence  of dissection or intramural hemorrhage. No mediastinal fluid is  identified. Proximal great vessels show stable bovine anatomy and  normal patency. The heart size is normal. No pleural or pericardial  fluid is visualized. No other incidental findings are noted.  IMPRESSION:  Stable aneurysmal dilatation of the proximal ascending thoracic  aorta measuring 4.7- 4.8 cm in greatest measured diameter.  Electronically Signed  By: Aletta Edouard M.D.  On: 02/28/2014 13:30    Impression:  He  has a stable fusiform ascending aneurysm that is 4.7-4.8 cm and does not require any intervention at this time. His BP is under reasonable control.   Plan:  I will see him back in 1 year with an MRA of the chest to reassess this aneurysm.

## 2014-03-14 ENCOUNTER — Other Ambulatory Visit (HOSPITAL_COMMUNITY): Payer: Self-pay | Admitting: Interventional Radiology

## 2014-03-14 ENCOUNTER — Telehealth: Payer: Self-pay | Admitting: Interventional Cardiology

## 2014-03-14 DIAGNOSIS — G43909 Migraine, unspecified, not intractable, without status migrainosus: Secondary | ICD-10-CM

## 2014-03-14 NOTE — Telephone Encounter (Signed)
New message    Wife calling    Receive a letter from [facility] regarding [place] . Need prior auth - for 90 days supply    Fax # [phone].

## 2014-03-15 NOTE — Telephone Encounter (Signed)
PA to [employer] rx for xarelto

## 2014-03-19 NOTE — Telephone Encounter (Signed)
[employer] approved the xarelto through 03/15/2015, PA # [id number]

## 2014-03-23 ENCOUNTER — Ambulatory Visit (HOSPITAL_COMMUNITY)
Admission: RE | Admit: 2014-03-23 | Discharge: 2014-03-23 | Disposition: A | Discharge status: Home / Self Care | Payer: Medicare Other | Source: Ambulatory Visit | Attending: Interventional Radiology | Admitting: Interventional Radiology

## 2014-03-23 DIAGNOSIS — G43909 Migraine, unspecified, not intractable, without status migrainosus: Secondary | ICD-10-CM

## 2014-03-26 ENCOUNTER — Ambulatory Visit (HOSPITAL_COMMUNITY): Admission: RE | Admit: 2014-03-26 | Payer: Medicare Other | Source: Ambulatory Visit

## 2014-03-28 ENCOUNTER — Ambulatory Visit (INDEPENDENT_AMBULATORY_CARE_PROVIDER_SITE_OTHER): Payer: Medicare Other | Admitting: Diagnostic Neuroimaging

## 2014-03-28 ENCOUNTER — Encounter: Payer: Self-pay | Admitting: Diagnostic Neuroimaging

## 2014-03-28 VITALS — BP 131/83 | HR 64 | Temp 97.5°F | Ht 65.5 in | Wt 267.0 lb

## 2014-03-28 DIAGNOSIS — H9319 Tinnitus, unspecified ear: Secondary | ICD-10-CM

## 2014-03-28 DIAGNOSIS — R51 Headache: Secondary | ICD-10-CM

## 2014-03-28 NOTE — Patient Instructions (Signed)
Continue acetazolamide.  Follow up with eye doctor.  Continue weight loss strategies.

## 2014-03-28 NOTE — Progress Notes (Signed)
[facility]  PATIENT: Joseph [name] DOB: 24-Oct-1941  REFERRING CLINICIAN: T Deveshwar HISTORY FROM: patient and wife REASON FOR VISIT: new consult   HISTORICAL  CHIEF COMPLAINT:  Chief Complaint  Patient presents with  . Hearing Loss         HISTORY OF PRESENT ILLNESS:   73 year old right-handed male here for evaluation of pseudotumor cerebri.  Patient has history of hypertension, hypercholesteremia, atrial fibrillation, migraine, cardiac ablation, intermittent depression and anxiety.  2007 patient began to have intermittent headaches, flushing hot sensations over his head. And 2010 he began to develop ringing in the ears. 2011 he noticed a "pulsating" sensation in his ears. In addition patient developed retro-orbital pain, headache, pressure sensations. Symptoms seem to be relieved partially by placing ice on his head or spraying water on his forehead. Sometimes patient feels pressure sensation inside his skull. Over the same timeframe patient has had significant weight gain. In 2000 he weighed 210 pounds. 2005 he weighed 250 pounds. In 2009 he weighed 315 pounds. Over past 5 years she has gradually reduce weight, currently at 267 pounds.  Patient has had ophthalmologic evaluation this during this time, most recently in January 2015 which showed no evidence of papilledema. 01/12/2012 patient had a "blurred vision" event following his ophthalmology examination.   Patient has had evaluation by another local neurologist who tried him and periodically on topiramate for one month, with a severe side effect. Patient is also tried acetazolamide, up to 700 mg twice a day, with significant side effects. Patient is on xarelto anticoagulation currently and therefore has not had a spinal tap yet.   REVIEW OF SYSTEMS: Full 14 system review of systems performed and notable only for insomnia sleepiness snoring memory loss headache dizziness tremor depression anxiety poor  sleep decreased energy joint pain aching muscle constipation flushing feeling hot cough easy bleeding eye pain blurred vision chills weight loss fatigue palpitation hearing loss ringing in a sitting position.  ALLERGIES: No Known Allergies  HOME MEDICATIONS: Outpatient Prescriptions Prior to Visit  Medication Sig Dispense Refill  . ALPRAZolam (XANAX) 0.5 MG tablet Take 0.5-1 mg by mouth 3 (three) times daily as needed for sleep.      . metoprolol succinate (TOPROL-XL) 100 MG 24 hr tablet Take 1 tablet (100 mg total) by mouth daily. Take with or immediately following a meal.  30 tablet  6  . pantoprazole (PROTONIX) 40 MG tablet Take 40 mg by mouth daily.      . potassium chloride SA (K-DUR,KLOR-CON) 20 MEQ tablet Take 1 tablet (20 mEq total) by mouth daily.  90 tablet  3  . Rivaroxaban (XARELTO) 20 MG TABS tablet Take 1 tablet (20 mg total) by mouth daily.  90 tablet  3  . zolpidem (AMBIEN) 10 MG tablet Take 5 mg by mouth daily as needed for sleep. For sleep      . acetaminophen (TYLENOL) 500 MG tablet Take 500 mg by mouth every 4 (four) hours as needed for pain (stops at 2500mg  daily).      Marland Kitchen acetaZOLAMIDE (DIAMOX) 125 MG tablet Take 250 mg by mouth 2 (two) times daily.       . Azelaic Acid (FINACEA) 15 % cream Apply topically daily. After skin is thoroughly washed and patted dry, gently but thoroughly massage a thin film of azelaic acid cream into the affected area twice daily, in the morning and evening.      . fluorouracil (EFUDEX) 5 % cream       .  halobetasol (ULTRAVATE) 0.05 % cream Apply 1 application topically daily.       Marland Kitchen olmesartan (BENICAR) 40 MG tablet Take 40 mg by mouth daily.       No facility-administered medications prior to visit.    PAST MEDICAL HISTORY: Past Medical History  Diagnosis Date  . Depression   . Headache(784.0)   . GERD (gastroesophageal reflux disease)   . Allergy   . Hypertension   . Hyperlipidemia   . Thoracic aneurysm   . Colon polyp   . Atrial  fibrillation   . Panic attacks   . Diverticulosis     PAST SURGICAL HISTORY: Past Surgical History  Procedure Laterality Date  . Appendectomy  1958  . Cardiac catheterization  2009  . Inguinal hernia repair      right  . Vasectomy    . Cardioversion  03/18/2012    Procedure: CARDIOVERSION;  Surgeon: [name], MD;  Location: [facility] OR;  Service: Cardiovascular;  Laterality: N/A;    FAMILY HISTORY: Family History  Problem Relation Age of Onset  . Heart disease Mother 36    mother  . Heart attack Mother   . Cancer Father     lung  . Heart disease Father   . Kidney disease Brother 77    died age 58 heart attack  . Heart attack Brother   . Heart disease Maternal Grandfather     SOCIAL HISTORY:  History   Social History  . Marital Status: Married    Spouse Name: [name]    Number of Children: 2  . Years of Education: College   Occupational History  . Retired    Social History Main Topics  . Smoking status: Never Smoker   . Smokeless tobacco: Never Used  . Alcohol Use: No  . Drug Use: No  . Sexual Activity: Not on file   Other Topics Concern  . Not on file   Social History Narrative   Patient lives at home with spouse.   Caffeine Use: 1 cup daily     PHYSICAL EXAM  Filed Vitals:   03/28/14 0854  BP: 131/83  Pulse: 64  Temp: 97.5 F (36.4 C)  TempSrc: Oral  Height: 5' 5.5" (1.664 m)  Weight: 267 lb (121.11 kg)    Not recorded    Body mass index is 43.74 kg/(m^2).  GENERAL EXAM: Patient is in no distress; well developed, nourished and groomed; neck is supple; OBESITY  CARDIOVASCULAR: Regular rate and rhythm, no murmurs, no carotid bruits  NEUROLOGIC: MENTAL STATUS: awake, alert, oriented to person, place and time, recent and remote memory intact, normal attention and concentration, language fluent, comprehension intact, naming intact, fund of knowledge appropriate CRANIAL NERVE: no papilledema on fundoscopic exam, pupils equal  and reactive to light, visual fields full to confrontation, extraocular muscles intact, no nystagmus, facial sensation and strength symmetric, hearing intact, palate elevates symmetrically, uvula midline, shoulder shrug symmetric, tongue midline. MOTOR: normal bulk and tone, full strength in the BUE, BLE SENSORY: normal and symmetric to light touch, temperature, vibration COORDINATION: finger-nose-finger, fine finger movements normal REFLEXES: deep tendon reflexes TRACE and symmetric GAIT/STATION: narrow based gait; able to walk on toes, heels and tandem; romberg is negative    DIAGNOSTIC DATA (LABS, IMAGING, TESTING) - I reviewed patient records, labs, notes, testing and imaging myself where available.  Lab Results  Component Value Date   WBC 6.9 09/30/2013   HGB 15.8 09/30/2013   HCT 44.1 09/30/2013  MCV 83.8 09/30/2013   PLT 291 09/30/2013      Component Value Date/Time   NA 133* 09/30/2013 0333   K 3.4* 09/30/2013 0333   CL 93* 09/30/2013 0333   CO2 24 09/30/2013 0333   GLUCOSE 129* 09/30/2013 0333   BUN 13 09/30/2013 0333   CREATININE 1.07 09/30/2013 0333   CALCIUM 8.9 09/30/2013 0333   PROT 7.0 09/30/2013 0333   ALBUMIN 4.1 09/30/2013 0333   AST 32 09/30/2013 0333   ALT 23 09/30/2013 0333   ALKPHOS 60 09/30/2013 0333   BILITOT 0.5 09/30/2013 0333   GFRNONAA 67* 09/30/2013 0333   GFRAA 78* 09/30/2013 0333   Lab Results  Component Value Date   CHOL 150 05/13/2011   HDL 46.40 05/13/2011   LDLCALC 86 05/13/2011   TRIG 87.0 05/13/2011   CHOLHDL 3 05/13/2011   No results found for this basename: HGBA1C   No results found for this basename: VITAMINB12   Lab Results  Component Value Date   TSH 2.223 02/26/2011    09/20/13 MRI BRAIN 1. No acute or focal abnormality to explain the patient's symptoms.  2. Scattered subcortical T2 hyperintensities are greater than expected for age. The finding is nonspecific but can be seen in the setting of chronic microvascular ischemia, a demyelinating  process such as multiple sclerosis, vasculitis, complicated migraine headaches, or as the sequelae of a prior infectious or inflammatory process.  3. Remote lacunar infarct of the right cerebellum.  09/20/13 MRA/MRV HEAD 1. Moderate distal small vessel disease.  2. Mild narrowing of the distal left A1 segment.  3. No focal lesion to explain the patient's symptoms.  4. Normal MR venogram.   ASSESSMENT AND PLAN  73 y.o. year old male here with progressive headache, pressure, blurred vision, tinnitus symptoms since 2007, in the setting of weight gain. Some migraine features also noted. Most likely represents idiopathic intracranial hypertension. Unfortunately patient has been intolerant of topiramate and acetazolamide. Also patient's current anticoagulation makes lumbar puncture assessment challenging. Since patient does not have papilledema at this time, would favor conservative management with low-dose acetazolamide, or consideration of furosemide. Also encouraged patient to continue on his track for further weight loss as this could be curative.  Ddx: pseudotumor cerebri vs migraine  PLAN: We'll observe/reevaluate in 3-6 months. If patient has not been able to improve symptoms with weight loss and low-dose acetazolamide and or furosemide, and we may need to arrange for stopping anticoagulation, checking lumbar puncture and proceed with evaluation from there.  Return in about 3 months (around 06/27/2014).    Penni Bombard, MD 01/31/2682, 41:96 AM Certified in Neurology, Neurophysiology and Neuroimaging  [facility] [address], [place], [place] [address] [phone]

## 2014-04-16 ENCOUNTER — Ambulatory Visit: Payer: 59 | Admitting: Interventional Cardiology

## 2014-04-16 ENCOUNTER — Ambulatory Visit (INDEPENDENT_AMBULATORY_CARE_PROVIDER_SITE_OTHER): Payer: Medicare Other | Admitting: Interventional Cardiology

## 2014-04-16 ENCOUNTER — Encounter: Payer: Self-pay | Admitting: Interventional Cardiology

## 2014-04-16 VITALS — BP 144/91 | HR 57 | Ht 65.0 in | Wt 268.0 lb

## 2014-04-16 DIAGNOSIS — I10 Essential (primary) hypertension: Secondary | ICD-10-CM

## 2014-04-16 DIAGNOSIS — Z7901 Long term (current) use of anticoagulants: Secondary | ICD-10-CM

## 2014-04-16 DIAGNOSIS — I4891 Unspecified atrial fibrillation: Secondary | ICD-10-CM

## 2014-04-16 DIAGNOSIS — IMO0001 Reserved for inherently not codable concepts without codable children: Secondary | ICD-10-CM

## 2014-04-16 DIAGNOSIS — I712 Thoracic aortic aneurysm, without rupture, unspecified: Secondary | ICD-10-CM

## 2014-04-16 NOTE — Patient Instructions (Signed)
Your physician recommends that you continue on your current medications as directed. Please refer to the Current Medication list given to you today.  Your physician wants you to follow-up in: 1 year. You will receive a reminder letter in the mail two months in advance. If you don't receive a letter, please call our office to schedule the follow-up appointment.  

## 2014-04-16 NOTE — Progress Notes (Signed)
Patient ID: Joseph [name], male   DOB: 08-20-1941, 73 y.o.   MRN: [id number]    [address] [place], [place]  [address] Phone: [phone] Fax:  [phone]  Date:  04/16/2014   ID:  FORTUNATO NORDIN, DOB 02-02-1941, MRN [id number]  PCP:  Reginia Naas, MD   ASSESSMENT:  1. History of atrial fibrillation. He is status post A. fib ablation 2. Hypertension, controlled 3. Chronic anticoagulation therapy with Xarelto  PLAN:  1. Will be okay to discontinue Xarelt for 72 hours prior to spinal tap if needed  2. Clinical followup in one year  SUBJECTIVE: Joseph [name] is a 73 y.o. male doing well from the cardiac standpoint. No prolonged episodes of atrial fibrillation. No bleeding on anticoagulation. He denies chest pain. No medication side effects.   Wt Readings from Last 3 Encounters:  04/16/14 268 lb (121.564 kg)  03/28/14 267 lb (121.11 kg)  02/28/14 267 lb (121.11 kg)     Past Medical History  Diagnosis Date  . Depression   . Headache(784.0)   . GERD (gastroesophageal reflux disease)   . Allergy   . Hypertension   . Hyperlipidemia   . Thoracic aneurysm   . Colon polyp   . Atrial fibrillation   . Panic attacks   . Diverticulosis     Current Outpatient Prescriptions  Medication Sig Dispense Refill  . acetaZOLAMIDE (DIAMOX) 250 MG tablet Take 500 mg by mouth 3 (three) times daily.      Marland Kitchen ALPRAZolam (XANAX) 0.5 MG tablet Take 0.5-1 mg by mouth 3 (three) times daily as needed for sleep.      . metoprolol succinate (TOPROL-XL) 100 MG 24 hr tablet Take 1 tablet (100 mg total) by mouth daily. Take with or immediately following a meal.  30 tablet  6  . olmesartan (BENICAR) 10 mg TABS tablet Take 10 mg by mouth daily.      . pantoprazole (PROTONIX) 40 MG tablet Take 40 mg by mouth daily.      . potassium chloride SA (K-DUR,KLOR-CON) 20 MEQ tablet Take 1 tablet (20 mEq total) by mouth daily.  90 tablet  3  . Rivaroxaban (XARELTO) 20 MG TABS tablet  Take 1 tablet (20 mg total) by mouth daily.  90 tablet  3  . zolpidem (AMBIEN) 10 MG tablet Take 5 mg by mouth daily as needed for sleep. For sleep      . [DISCONTINUED] famotidine (PEPCID AC) 10 MG chewable tablet Chew 10 mg by mouth as needed.         No current facility-administered medications for this visit.    Allergies:   No Known Allergies  Social History:  The patient  reports that he has never smoked. He has never used smokeless tobacco. He reports that he does not drink alcohol or use illicit drugs.   ROS:  Please see the history of present illness.   Obese, physical he an active, having headaches related to pseudotumor cerebra.   All other systems reviewed and negative.   OBJECTIVE: VS:  BP 144/91  Pulse 57  Ht 5\' 5"  (1.651 m)  Wt 268 lb (121.564 kg)  BMI 44.60 kg/m2 Well nourished, well developed, in no acute distress, obese HEENT: normal Neck: JVD flat. Carotid bruit absent  Cardiac:  normal S1, S2; RRR; no murmur Lungs:  clear to auscultation bilaterally, no wheezing, rhonchi or rales Abd: soft, nontender, no hepatomegaly Ext: Edema absent. Pulses 2+ and symmetric Skin: warm  and dry Neuro:  CNs 2-12 intact, no focal abnormalities noted  EKG:  Not repeated       Signed, Illene Labrador III, MD 04/16/2014 12:47 PM

## 2014-04-26 ENCOUNTER — Other Ambulatory Visit: Payer: Self-pay | Admitting: Interventional Cardiology

## 2014-05-28 ENCOUNTER — Other Ambulatory Visit: Payer: Self-pay | Admitting: *Deleted

## 2014-05-28 MED ORDER — METOPROLOL SUCCINATE ER 100 MG PO TB24: ORAL_TABLET | ORAL | Status: DC

## 2014-06-28 ENCOUNTER — Ambulatory Visit (INDEPENDENT_AMBULATORY_CARE_PROVIDER_SITE_OTHER): Payer: Medicare Other | Admitting: Diagnostic Neuroimaging

## 2014-06-28 ENCOUNTER — Encounter: Payer: Self-pay | Admitting: Diagnostic Neuroimaging

## 2014-06-28 VITALS — BP 121/74 | HR 63 | Ht 69.0 in | Wt 267.4 lb

## 2014-06-28 DIAGNOSIS — H9319 Tinnitus, unspecified ear: Secondary | ICD-10-CM

## 2014-06-28 DIAGNOSIS — H9313 Tinnitus, bilateral: Secondary | ICD-10-CM

## 2014-06-28 DIAGNOSIS — R51 Headache: Secondary | ICD-10-CM

## 2014-06-28 NOTE — Patient Instructions (Signed)
I will check lumbar puncture.  Please follow up with ophthalmology.

## 2014-06-28 NOTE — Progress Notes (Signed)
[facility]  PATIENT: Joseph [name] DOB: [birth year]  REFERRING CLINICIAN: T Deveshwar HISTORY FROM: patient and wife REASON FOR VISIT: follow up   HISTORICAL  CHIEF COMPLAINT:  Chief Complaint  Patient presents with  . Follow-up    HA    HISTORY OF PRESENT ILLNESS:   UPDATE 06/28/14: HA are stable. More "pressure sensation". Ringing/pulsating sound stable. Int blurred vision. Had kidney stones, so had to stop diamox. Now off xarelto per his electrophysiologist (Dr. Elonda Husky) because he has not had any recurrence of atrial fibrillation.  PRIOR HPI (03/28/14): 73 year old right-handed male here for evaluation of pseudotumor cerebri. Patient has history of hypertension, hypercholesteremia, atrial fibrillation, migraine, cardiac ablation, intermittent depression and anxiety. 2007 patient began to have intermittent headaches, flushing hot sensations over his head. And 2010 he began to develop ringing in the ears. 2011 he noticed a "pulsating" sensation in his ears. In addition patient developed retro-orbital pain, headache, pressure sensations. Symptoms seem to be relieved partially by placing ice on his head or spraying water on his forehead. Sometimes patient feels pressure sensation inside his skull. Over the same timeframe patient has had significant weight gain. In 2000 he weighed 210 pounds. 2005 he weighed 250 pounds. In 2009 he weighed 315 pounds. Over past 5 years she has gradually reduce weight, currently at 267 pounds. Patient has had ophthalmologic evaluation this during this time, most recently in January 2015 which showed no evidence of papilledema. 01/12/2012 patient had a "blurred vision" event following his ophthalmology examination.  Patient has had evaluation by another local neurologist who tried him and periodically on topiramate for one month, with a severe side effect. Patient is also tried acetazolamide, up to 700 mg twice a day, with significant side  effects. Patient is on xarelto anticoagulation currently and therefore has not had a spinal tap yet.   REVIEW OF SYSTEMS: Full 14 system review of systems performed and notable only for chills fatigue ringing in ears runny nose eye redness light sensitivity eye pain chest pain palpitation daytime sleepiness snoring depression anxiety dizziness headache numbness constipation.   ALLERGIES: No Known Allergies  HOME MEDICATIONS: Outpatient Prescriptions Prior to Visit  Medication Sig Dispense Refill  . ALPRAZolam (XANAX) 0.5 MG tablet Take 0.5-1 mg by mouth 3 (three) times daily as needed for sleep.      . metoprolol succinate (TOPROL-XL) 100 MG 24 hr tablet TAKE 1 TABLET BY MOUTH EVERY DAY WITH OR IMMEDIATELY FOLLOWING A MEAL  90 tablet  3  . olmesartan (BENICAR) 10 mg TABS tablet Take 10 mg by mouth daily.      . pantoprazole (PROTONIX) 40 MG tablet Take 40 mg by mouth daily.      . potassium chloride SA (K-DUR,KLOR-CON) 20 MEQ tablet Take 1 tablet (20 mEq total) by mouth daily.  90 tablet  3  . zolpidem (AMBIEN) 10 MG tablet Take 5 mg by mouth daily as needed for sleep. For sleep      . acetaZOLAMIDE (DIAMOX) 250 MG tablet Take 500 mg by mouth 3 (three) times daily.      . Rivaroxaban (XARELTO) 20 MG TABS tablet Take 1 tablet (20 mg total) by mouth daily.  90 tablet  3   No facility-administered medications prior to visit.    PAST MEDICAL HISTORY: Past Medical History  Diagnosis Date  . Depression   . Headache(784.0)   . GERD (gastroesophageal reflux disease)   . Allergy   . Hypertension   . Hyperlipidemia   .  Thoracic aneurysm   . Colon polyp   . Atrial fibrillation   . Panic attacks   . Diverticulosis     PAST SURGICAL HISTORY: Past Surgical History  Procedure Laterality Date  . Appendectomy  1958  . Cardiac catheterization  2009  . Inguinal hernia repair      right  . Vasectomy    . Cardioversion  03/18/2012    Procedure: CARDIOVERSION;  Surgeon: [name],  MD;  Location: [facility];  Service: Cardiovascular;  Laterality: N/A;    FAMILY HISTORY: Family History  Problem Relation Age of Onset  . Heart disease Mother 18    mother  . Heart attack Mother   . Cancer Father     lung  . Heart disease Father   . Kidney disease Brother 86    died age 46 heart attack  . Heart attack Brother   . Heart disease Maternal Grandfather     SOCIAL HISTORY:  History   Social History  . Marital Status: Married    Spouse Name: [name]    Number of Children: 2  . Years of Education: College   Occupational History  . Retired    Social History Main Topics  . Smoking status: Never Smoker   . Smokeless tobacco: Never Used  . Alcohol Use: No  . Drug Use: No  . Sexual Activity: Not on file   Other Topics Concern  . Not on file   Social History Narrative   Patient lives at home with spouse.   Caffeine Use: 1 cup daily     PHYSICAL EXAM  Filed Vitals:   06/28/14 1303  BP: 121/74  Pulse: 63  Height: 5\' 9"  (1.753 m)  Weight: 267 lb 6.4 oz (121.292 kg)    Not recorded    Body mass index is 39.47 kg/(m^2).  GENERAL EXAM: Patient is in no distress; well developed, nourished and groomed; neck is supple; OBESITY  CARDIOVASCULAR: Regular rate and rhythm, no murmurs, no carotid bruits  NEUROLOGIC: MENTAL STATUS: awake, alert, language fluent, comprehension intact, naming intact, fund of knowledge appropriate CRANIAL NERVE: no papilledema on fundoscopic exam, pupils equal and reactive to light, visual fields full to confrontation, extraocular muscles intact, no nystagmus, facial sensation and strength symmetric, hearing intact, palate elevates symmetrically, uvula midline, shoulder shrug symmetric, tongue midline. MOTOR: normal bulk and tone, full strength in the BUE, BLE SENSORY: normal and symmetric to light touch, temperature, vibration COORDINATION: finger-nose-finger, fine finger movements normal REFLEXES: deep tendon reflexes  TRACE and symmetric GAIT/STATION: narrow based gait; able to walk on tandem; romberg is negative    DIAGNOSTIC DATA (LABS, IMAGING, TESTING) - I reviewed patient records, labs, notes, testing and imaging myself where available.  Lab Results  Component Value Date   WBC 6.9 09/30/2013   HGB 15.8 09/30/2013   HCT 44.1 09/30/2013   MCV 83.8 09/30/2013   PLT 291 09/30/2013      Component Value Date/Time   NA 133* 09/30/2013 0333   K 3.4* 09/30/2013 0333   CL 93* 09/30/2013 0333   CO2 24 09/30/2013 0333   GLUCOSE 129* 09/30/2013 0333   BUN 13 09/30/2013 0333   CREATININE 1.07 09/30/2013 0333   CALCIUM 8.9 09/30/2013 0333   PROT 7.0 09/30/2013 0333   ALBUMIN 4.1 09/30/2013 0333   AST 32 09/30/2013 0333   ALT 23 09/30/2013 0333   ALKPHOS 60 09/30/2013 0333   BILITOT 0.5 09/30/2013 0333   GFRNONAA 67* 09/30/2013 0333  GFRAA 78* 09/30/2013 0333   Lab Results  Component Value Date   CHOL 150 05/13/2011   HDL 46.40 05/13/2011   LDLCALC 86 05/13/2011   TRIG 87.0 05/13/2011   CHOLHDL 3 05/13/2011   No results found for this basename: HGBA1C   No results found for this basename: VITAMINB12   Lab Results  Component Value Date   TSH 2.223 02/26/2011    09/20/13 MRI BRAIN 1. No acute or focal abnormality to explain the patient's symptoms.  2. Scattered subcortical T2 hyperintensities are greater than expected for age. The finding is nonspecific but can be seen in the setting of chronic microvascular ischemia, a demyelinating process such as multiple sclerosis, vasculitis, complicated migraine headaches, or as the sequelae of a prior infectious or inflammatory process.  3. Remote lacunar infarct of the right cerebellum.  09/20/13 MRA/MRV HEAD 1. Moderate distal small vessel disease.  2. Mild narrowing of the distal left A1 segment.  3. No focal lesion to explain the patient's symptoms.  4. Normal MR venogram.   ASSESSMENT AND PLAN  73 y.o. year old male here with progressive headache, pressure,  blurred vision, tinnitus symptoms since 2007, in the setting of weight gain. Some migraine features also noted. Patient has been intolerant of topiramate and acetazolamide. This may represent idiopathic intracranial hypertension. Now patient off anticoagulation, so we will check LP to confirm diagnosis.   Ddx: pseudotumor cerebri vs migraine vs tension HA vs conversion reaction  PLAN: - lumbar puncture (now off anticoagulation since 05/16/14); if pressure elevated, will try furosemide  Return in about 3 months (around 09/28/2014).    Penni Bombard, MD 2/0/8022, 3:36 PM Certified in Neurology, Neurophysiology and Neuroimaging  [facility] [address], [place], [place] [address] [phone]

## 2014-07-09 ENCOUNTER — Ambulatory Visit
Admission: RE | Admit: 2014-07-09 | Discharge: 2014-07-09 | Disposition: A | Discharge status: Home / Self Care | Payer: 59 | Source: Ambulatory Visit | Attending: Diagnostic Neuroimaging | Admitting: Diagnostic Neuroimaging

## 2014-07-09 ENCOUNTER — Telehealth: Payer: Self-pay | Admitting: Diagnostic Neuroimaging

## 2014-07-09 VITALS — BP 145/71 | HR 51

## 2014-07-09 DIAGNOSIS — H9313 Tinnitus, bilateral: Secondary | ICD-10-CM

## 2014-07-09 DIAGNOSIS — Z86010 Personal history of colonic polyps: Secondary | ICD-10-CM

## 2014-07-09 DIAGNOSIS — E876 Hypokalemia: Secondary | ICD-10-CM

## 2014-07-09 DIAGNOSIS — Z7901 Long term (current) use of anticoagulants: Secondary | ICD-10-CM

## 2014-07-09 DIAGNOSIS — E785 Hyperlipidemia, unspecified: Secondary | ICD-10-CM

## 2014-07-09 DIAGNOSIS — R51 Headache: Secondary | ICD-10-CM

## 2014-07-09 LAB — PROTEIN, CSF: Total Protein, CSF: 57 mg/dL — ABNORMAL HIGH (ref 15–45)

## 2014-07-09 LAB — CSF CELL COUNT WITH DIFFERENTIAL
RBC Count, CSF: 0 cu mm
TUBE #: 3
WBC, CSF: 1 cu mm (ref 0–5)

## 2014-07-09 LAB — GLUCOSE, CSF: Glucose, CSF: 64 mg/dL (ref 43–76)

## 2014-07-09 NOTE — Telephone Encounter (Signed)
Noted. -VRP 

## 2014-07-09 NOTE — Discharge Instructions (Signed)
Lumbar Puncture Discharge Instructions ° °1. Go home and rest quietly for the next 24 hours.  It is important to lie flat for the next 24 hours.  Get up only to go to the restroom.  You may lie in the bed or on a couch on your back, your stomach, your left side or your right side.  You may have one pillow under your head.  You may have pillows between your knees while you are on your side or under your knees while you are on your back. ° °2. DO NOT drive today.  Recline the seat as far back as it will go, while still wearing your seat belt, on the way home. ° °3. You may get up to go to the bathroom as needed.  You may sit up for 10 minutes to eat.  You may resume your normal diet and medications unless otherwise indicated.  Drink plenty of extra fluids today and tomorrow. ° °4. The incidence of a spinal headache with nausea and/or vomiting is about 5% (one in 20 patients).  If you develop a headache, lie flat and drink plenty of fluids until the headache goes away.  Caffeinated beverages may be helpful.  If you develop severe nausea and vomiting or a headache that does not go away with flat bed rest, call [phone]. ° °5. You may resume normal activities after your 24 hours of bed rest is over; however, do not exert yourself strongly or do any heavy lifting tomorrow. ° °6. Call your physician for a follow-up appointment.  °

## 2014-07-09 NOTE — Telephone Encounter (Signed)
Shevon from [employer], # [phone], calling about pt stat results, stain gram spinal fluid, WBC present prominently mono nuclear. No organism seen. FYI

## 2014-07-12 ENCOUNTER — Telehealth: Payer: Self-pay | Admitting: Interventional Cardiology

## 2014-07-12 LAB — CSF CULTURE W GRAM STAIN: Gram Stain: NONE SEEN

## 2014-07-12 LAB — CSF CULTURE: ORGANISM ID, BACTERIA: NO GROWTH

## 2014-07-12 NOTE — Telephone Encounter (Signed)
**Note De-Identified Joseph [name] Obfuscation** The pt c/o irregular HR for the past 30 hours. He denies CP and sob but states that he is mildly light headed. He states that this has happened in the past but that his heart converted back to regular rhythm after 12 hours.    Per Dr Acie Fredrickson the pt is advised to take Xarelto and Metoprolol as directed and to keep his appointment with Dr Tamala Julian that is scheduled in the morning at 9:15. Also, he is advised that if his s/s worsen to call us back if during our office hours and if not to call 911 or have someone drive him to the ER. He verbalized understanding.Will forward note to Dr Tamala Julian as Juluis Rainier.

## 2014-07-12 NOTE — Telephone Encounter (Signed)
New problem:      Per wife      Pt has bee is in Afib started 30hr per wife HS said come in when this happens.   Pt is very concerned and needs a call back please.

## 2014-07-13 ENCOUNTER — Ambulatory Visit (INDEPENDENT_AMBULATORY_CARE_PROVIDER_SITE_OTHER): Payer: Medicare Other | Admitting: Interventional Cardiology

## 2014-07-13 ENCOUNTER — Encounter: Payer: Self-pay | Admitting: Interventional Cardiology

## 2014-07-13 VITALS — BP 130/80 | HR 116 | Ht 69.0 in | Wt 270.8 lb

## 2014-07-13 DIAGNOSIS — I484 Atypical atrial flutter: Secondary | ICD-10-CM

## 2014-07-13 DIAGNOSIS — I4891 Unspecified atrial fibrillation: Secondary | ICD-10-CM

## 2014-07-13 DIAGNOSIS — Z7901 Long term (current) use of anticoagulants: Secondary | ICD-10-CM

## 2014-07-13 DIAGNOSIS — I480 Paroxysmal atrial fibrillation: Secondary | ICD-10-CM

## 2014-07-13 DIAGNOSIS — I10 Essential (primary) hypertension: Secondary | ICD-10-CM

## 2014-07-13 DIAGNOSIS — I4892 Unspecified atrial flutter: Secondary | ICD-10-CM

## 2014-07-13 HISTORY — DX: Atypical atrial flutter: I48.4

## 2014-07-13 MED ORDER — METOPROLOL SUCCINATE ER 100 MG PO TB24: ORAL_TABLET | ORAL | Status: DC

## 2014-07-13 MED ORDER — RIVAROXABAN 20 MG PO TABS: 20.0000 mg | ORAL_TABLET | Freq: Every day | ORAL | Status: DC

## 2014-07-13 MED ORDER — AMIODARONE HCL 200 MG PO TABS: 200.0000 mg | ORAL_TABLET | Freq: Two times a day (BID) | ORAL | Status: DC

## 2014-07-13 NOTE — Progress Notes (Signed)
Patient ID: Joseph [name], male   DOB: 26-Apr-1941, 73 y.o.   MRN: [id number]    [address] [place], [place]  [address] Phone: [phone] Fax:  [phone]  Date:  07/13/2014   ID:  Joseph [name], DOB December 16, 1941, MRN [id number]  PCP:  Reginia Naas, MD   ASSESSMENT:  1. Recurrent atrial arrhythmia, possibly atrial flutter. He is status post ablation for atrial fibrillation to 2. Thoracic aortic aneurysm 3. Hypertension 4. Anticoagulation therapy  PLAN:  1. Resume anticoagulation (are done by the patient, Xarelto) 2. Increase metoprolol to 200 mg daily 3. Resume amiodarone 200 mg twice a day 3. Clinical followup in 5-7 days 5. Requested that he call or go to the emergency room if dyspnea, syncope, chest pain, or other complaints   SUBJECTIVE: Joseph [name] is a 73 y.o. male who noted a change in his heart rhythm approximately 55 hours ago and immediately took Xarelto. He denies syncope, but has had orthopnea, exertional fatigue, and DOE.   Wt Readings from Last 3 Encounters:  07/13/14 270 lb 12.8 oz (122.834 kg)  06/28/14 267 lb 6.4 oz (121.292 kg)  04/16/14 268 lb (121.564 kg)     Past Medical History  Diagnosis Date  . Depression   . Headache(784.0)   . GERD (gastroesophageal reflux disease)   . Allergy   . Hypertension   . Hyperlipidemia   . Thoracic aneurysm   . Colon polyp   . Atrial fibrillation   . Panic attacks   . Diverticulosis     Current Outpatient Prescriptions  Medication Sig Dispense Refill  . ALPRAZolam (XANAX) 0.5 MG tablet Take 0.5-1 mg by mouth 3 (three) times daily as needed for sleep.      . metoprolol succinate (TOPROL-XL) 100 MG 24 hr tablet TAKE 1 TABLET BY MOUTH TWICE A DAY WITH OR IMMEDIATELY FOLLOWING A MEAL  180 tablet  3  . olmesartan (BENICAR) 40 MG tablet Take 40 mg by mouth daily.      . pantoprazole (PROTONIX) 40 MG tablet Take 40 mg by mouth daily.      . potassium chloride SA (K-DUR,KLOR-CON) 20  MEQ tablet Take 1 tablet (20 mEq total) by mouth daily.  90 tablet  3  . rivaroxaban (XARELTO) 20 MG TABS tablet Take 1 tablet (20 mg total) by mouth daily with supper.  90 tablet  3  . zolpidem (AMBIEN) 10 MG tablet Take 5 mg by mouth daily as needed for sleep. For sleep      . amiodarone (PACERONE) 200 MG tablet Take 1 tablet (200 mg total) by mouth 2 (two) times daily.  180 tablet  3  . [DISCONTINUED] famotidine (PEPCID AC) 10 MG chewable tablet Chew 10 mg by mouth as needed.         No current facility-administered medications for this visit.    Allergies:   No Known Allergies  Social History:  The patient  reports that he has never smoked. He has never used smokeless tobacco. He reports that he does not drink alcohol or use illicit drugs.   ROS:  Please see the history of present illness.   He denies neurological complaints. He has not had edema orthopnea.   All other systems reviewed and negative.   OBJECTIVE: VS:  BP 130/80  Pulse 116  Ht 5\' 9"  (1.753 m)  Wt 270 lb 12.8 oz (122.834 kg)  BMI 39.97 kg/m2 Well nourished, well developed, in no acute distress, obese  HEENT: normal Neck: JVD flat. Carotid bruit absent  Cardiac:  normal S1, S2; rapid and regular rhythm; no murmur Lungs:  clear to auscultation bilaterally, no wheezing, rhonchi or rales Abd: soft, nontender, no hepatomegaly Ext: Edema absent. Pulses palpable Skin: warm and dry Neuro:  CNs 2-12 intact, no focal abnormalities noted  EKG:  ?Atrial flutter (atypical) versus AF with rapid ventricular response   .    Signed, Illene Labrador III, MD 07/13/2014 10:12 AM

## 2014-07-13 NOTE — Patient Instructions (Addendum)
Your physician has recommended you make the following change in your medication:  1. INCREASE METOPROLOL 100 MG TWICE A DAY   2. START XERALTO  20 MG  ONCE A DAY   3. START AMIODARONE 200 MG TWICE A DAY  Your physician recommends that you schedule a follow-up appointment in:  [place]

## 2014-07-17 ENCOUNTER — Encounter: Payer: Self-pay | Admitting: Interventional Cardiology

## 2014-07-17 ENCOUNTER — Ambulatory Visit (INDEPENDENT_AMBULATORY_CARE_PROVIDER_SITE_OTHER): Payer: Medicare Other | Admitting: Interventional Cardiology

## 2014-07-17 VITALS — BP 126/92 | HR 60 | Ht 69.0 in | Wt 269.1 lb

## 2014-07-17 DIAGNOSIS — Z79899 Other long term (current) drug therapy: Secondary | ICD-10-CM

## 2014-07-17 NOTE — Patient Instructions (Addendum)
Your physician recommends that you continue on your current medications as directed. Please refer to the Current Medication list given to you today.  Your physician recommends that you schedule a follow-up appointment on 07/31/14 at 4:15 pm  with EKG.  Call if you having chest pain, shortness of breath or increased swelling.

## 2014-07-17 NOTE — Progress Notes (Signed)
Patient ID: NASHAUN HILLMER, male   DOB: 1941/09/10, 73 y.o.   MRN: [id number]    [address] [place], [place]  [address] Phone: [phone] Fax:  [phone]  Date:  07/17/2014   ID:  BRON SNELLINGS, DOB 08-23-1941, MRN [id number]  PCP:  Reginia Naas, MD   ASSESSMENT:  1. Atrial fib with moderate rate control. Feeling better since the addition of amiodarone 2. Diastolic heart failure controlled 3. Anticoagulation therapy without bleeding 4. Amiodarone therapy  PLAN:  1. Continue amiodarone 200 mg twice a day until next office visit in 2 weeks 2. Still in atrial fibrillation next office visit, we will plan to perform elective cardioversion within 10-14 days of that office visit 3. He should call if any weakness, dyspnea, chest discomfort, or edema   SUBJECTIVE: TORRI LANGSTON is a 73 y.o. male who now feels better and less fatigued/dyspneic.Marland Kitchen He has not had syncope. No side effects to the current medical regimen. No transient neurological symptoms. No bleeding on anticoagulation.   Wt Readings from Last 3 Encounters:  07/17/14 269 lb 1.9 oz (122.072 kg)  07/13/14 270 lb 12.8 oz (122.834 kg)  06/28/14 267 lb 6.4 oz (121.292 kg)     Past Medical History  Diagnosis Date  . Depression   . Headache(784.0)   . GERD (gastroesophageal reflux disease)   . Allergy   . Hypertension   . Hyperlipidemia   . Thoracic aneurysm   . Colon polyp   . Atrial fibrillation   . Panic attacks   . Diverticulosis     Current Outpatient Prescriptions  Medication Sig Dispense Refill  . ALPRAZolam (XANAX) 0.5 MG tablet Take 0.5-1 mg by mouth 3 (three) times daily as needed for sleep.      Marland Kitchen amiodarone (PACERONE) 200 MG tablet Take 1 tablet (200 mg total) by mouth 2 (two) times daily.  180 tablet  3  . metoprolol succinate (TOPROL-XL) 100 MG 24 hr tablet TAKE 1 TABLET BY MOUTH TWICE A DAY WITH OR IMMEDIATELY FOLLOWING A MEAL  180 tablet  3  . olmesartan (BENICAR) 40 MG  tablet Take 40 mg by mouth daily.      . pantoprazole (PROTONIX) 40 MG tablet Take 40 mg by mouth daily.      . potassium chloride SA (K-DUR,KLOR-CON) 20 MEQ tablet Take 1 tablet (20 mEq total) by mouth daily.  90 tablet  3  . rivaroxaban (XARELTO) 20 MG TABS tablet Take 1 tablet (20 mg total) by mouth daily with supper.  90 tablet  3  . zolpidem (AMBIEN) 10 MG tablet Take 5 mg by mouth daily as needed for sleep. For sleep      . [DISCONTINUED] famotidine (PEPCID AC) 10 MG chewable tablet Chew 10 mg by mouth as needed.         No current facility-administered medications for this visit.    Allergies:   No Known Allergies  Social History:  The patient  reports that he has never smoked. He has never used smokeless tobacco. He reports that he does not drink alcohol or use illicit drugs.   ROS:  Please see the history of present illness.   No blood in urine or stool.   All other systems reviewed and negative.   OBJECTIVE: VS:  BP 126/92  Pulse 60  Ht 5\' 9"  (1.753 m)  Wt 269 lb 1.9 oz (122.072 kg)  BMI 39.72 kg/m2 Well nourished, well developed, in no acute distress,  appears health HEENT: normal Neck: JVD flat. Carotid bruit absent  Cardiac:  normal S1, S2; IIRR; no murmur Lungs:  clear to auscultation bilaterally, no wheezing, rhonchi or rales Abd: soft, nontender, no hepatomegaly Ext: Edema absent. Pulses 2+ bilateral Skin: warm and dry Neuro:  CNs 2-12 intact, no focal abnormalities noted  EKG:  Not repeated today       Signed, Illene Labrador III, MD 07/17/2014 12:04 PM

## 2014-07-31 ENCOUNTER — Encounter: Payer: Self-pay | Admitting: Interventional Cardiology

## 2014-07-31 ENCOUNTER — Ambulatory Visit (INDEPENDENT_AMBULATORY_CARE_PROVIDER_SITE_OTHER): Payer: Medicare Other | Admitting: Interventional Cardiology

## 2014-07-31 VITALS — BP 150/85 | HR 63 | Ht 69.0 in | Wt 269.0 lb

## 2014-07-31 DIAGNOSIS — Z01812 Encounter for preprocedural laboratory examination: Secondary | ICD-10-CM

## 2014-07-31 NOTE — Patient Instructions (Signed)
Your physician recommends that you continue on your current medications as directed. Please refer to the Current Medication list given to you today.  Your physician has recommended that you have a Cardioversion (DCCV). Electrical Cardioversion uses a jolt of electricity to your heart either through paddles or wired patches attached to your chest. This is a controlled, usually prescheduled, procedure. Defibrillation is done under light anesthesia in the hospital, and you usually go home the day of the procedure. This is done to get your heart back into a normal rhythm. You are not awake for the procedure. Please see the instruction sheet given to you today.  Your physician recommends that you return for lab work on 08/21/14  You have a follow up appt scheduled on 09/06/14 @ 1:45pm

## 2014-07-31 NOTE — Progress Notes (Signed)
Patient ID: Joseph [name], male   DOB: 25-Jan-1941, 73 y.o.   MRN: [id number]    [address] [place], [place]  [address] Phone: [phone] Fax:  [phone]  Date:  07/31/2014   ID:  Joseph [name], DOB 11/15/1941, MRN [id number]  PCP:  Reginia Naas, MD   ASSESSMENT:  1. Atrial flutter with controlled rate 2. Amiodarone 3. Anticoagulation  PLAN:  1. Set up elective cardioversion in 2 weeks 2. Followup office visit in 2 weeks thereafter 3. Referral to BP thereafter for consideration of ablation versus chronic medical therapy   SUBJECTIVE: Joseph [name] is a 73 y.o. male who now feels well with controlled rate. No bleeding on anticoagulation.   Wt Readings from Last 3 Encounters:  07/31/14 269 lb (122.018 kg)  07/17/14 269 lb 1.9 oz (122.072 kg)  07/13/14 270 lb 12.8 oz (122.834 kg)     Past Medical History  Diagnosis Date  . Depression   . Headache(784.0)   . GERD (gastroesophageal reflux disease)   . Allergy   . Hypertension   . Hyperlipidemia   . Thoracic aneurysm   . Colon polyp   . Atrial fibrillation   . Panic attacks   . Diverticulosis     Current Outpatient Prescriptions  Medication Sig Dispense Refill  . ALPRAZolam (XANAX) 0.5 MG tablet Take 0.5-1 mg by mouth 3 (three) times daily as needed for sleep.      Marland Kitchen amiodarone (PACERONE) 200 MG tablet Take 1 tablet (200 mg total) by mouth 2 (two) times daily.  180 tablet  3  . metoprolol succinate (TOPROL-XL) 100 MG 24 hr tablet TAKE 1 TABLET BY MOUTH TWICE A DAY WITH OR IMMEDIATELY FOLLOWING A MEAL  180 tablet  3  . olmesartan (BENICAR) 40 MG tablet Take 40 mg by mouth daily.      . pantoprazole (PROTONIX) 40 MG tablet Take 40 mg by mouth daily.      . potassium chloride SA (K-DUR,KLOR-CON) 20 MEQ tablet Take 1 tablet (20 mEq total) by mouth daily.  90 tablet  3  . rivaroxaban (XARELTO) 20 MG TABS tablet Take 1 tablet (20 mg total) by mouth daily with supper.  90 tablet  3  .  zolpidem (AMBIEN) 10 MG tablet Take 5 mg by mouth daily as needed for sleep. For sleep      . [DISCONTINUED] famotidine (PEPCID AC) 10 MG chewable tablet Chew 10 mg by mouth as needed.         No current facility-administered medications for this visit.    Allergies:   No Known Allergies  Social History:  The patient  reports that he has never smoked. He has never used smokeless tobacco. He reports that he does not drink alcohol or use illicit drugs.   ROS:  Please see the history of present illness.   No melena or bright red blood per rectum   All other systems reviewed and negative.   OBJECTIVE: VS:  BP 150/85  Pulse 63  Ht 5\' 9"  (1.753 m)  Wt 269 lb (122.018 kg)  BMI 39.71 kg/m2 Well nourished, well developed, in no acute distress, obese HEENT: normal Neck: JVD flat. Carotid bruit absent  Cardiac:  normal S1, S2; RRR; no murmur. Rhythm is regular and is in 4-1 AV block on EKG Lungs:  clear to auscultation bilaterally, no wheezing, rhonchi or rales Abd: soft, nontender, no hepatomegaly Ext: Edema none. Pulses 2+ Skin: warm and dry Neuro:  CNs 2-12 intact, no focal abnormalities noted  EKG:  A. flutter with 4-1 AV block       Signed, Illene Labrador III, MD 07/31/2014 9:19 AM

## 2014-08-01 ENCOUNTER — Other Ambulatory Visit: Payer: Self-pay | Admitting: Interventional Cardiology

## 2014-08-01 DIAGNOSIS — I4892 Unspecified atrial flutter: Secondary | ICD-10-CM

## 2014-08-07 ENCOUNTER — Other Ambulatory Visit: Payer: Self-pay | Admitting: Interventional Cardiology

## 2014-08-09 ENCOUNTER — Encounter (HOSPITAL_COMMUNITY): Payer: Self-pay | Admitting: Pharmacy Technician

## 2014-08-21 ENCOUNTER — Other Ambulatory Visit (INDEPENDENT_AMBULATORY_CARE_PROVIDER_SITE_OTHER): Payer: Medicare Other

## 2014-08-21 DIAGNOSIS — Z01812 Encounter for preprocedural laboratory examination: Secondary | ICD-10-CM

## 2014-08-21 LAB — BASIC METABOLIC PANEL
BUN: 12 mg/dL (ref 6–23)
CALCIUM: 9.3 mg/dL (ref 8.4–10.5)
CO2: 28 mEq/L (ref 19–32)
Chloride: 104 mEq/L (ref 96–112)
Creatinine, Ser: 1.3 mg/dL (ref 0.4–1.5)
GFR: 56.99 mL/min — AB (ref >60.00)
Glucose, Bld: 115 mg/dL — ABNORMAL HIGH (ref 70–99)
Potassium: 4.1 mEq/L (ref 3.5–5.1)
SODIUM: 140 meq/L (ref 135–145)

## 2014-08-23 ENCOUNTER — Encounter (HOSPITAL_COMMUNITY)
Admission: RE | Disposition: A | Discharge status: Home / Self Care | Payer: Self-pay | Source: Ambulatory Visit | Attending: Interventional Cardiology

## 2014-08-23 ENCOUNTER — Telehealth: Payer: Self-pay

## 2014-08-23 ENCOUNTER — Encounter (HOSPITAL_COMMUNITY): Payer: Medicare Other | Admitting: Anesthesiology

## 2014-08-23 ENCOUNTER — Ambulatory Visit (HOSPITAL_COMMUNITY): Payer: Medicare Other | Admitting: Anesthesiology

## 2014-08-23 ENCOUNTER — Encounter (HOSPITAL_COMMUNITY): Payer: Self-pay | Admitting: *Deleted

## 2014-08-23 ENCOUNTER — Ambulatory Visit (HOSPITAL_COMMUNITY)
Admission: RE | Admit: 2014-08-23 | Discharge: 2014-08-23 | Disposition: A | Discharge status: Home / Self Care | Payer: Medicare Other | Source: Ambulatory Visit | Attending: Interventional Cardiology | Admitting: Interventional Cardiology

## 2014-08-23 DIAGNOSIS — F341 Dysthymic disorder: Secondary | ICD-10-CM | POA: Diagnosis not present

## 2014-08-23 DIAGNOSIS — K219 Gastro-esophageal reflux disease without esophagitis: Secondary | ICD-10-CM | POA: Diagnosis not present

## 2014-08-23 DIAGNOSIS — I4892 Unspecified atrial flutter: Secondary | ICD-10-CM

## 2014-08-23 DIAGNOSIS — I10 Essential (primary) hypertension: Secondary | ICD-10-CM | POA: Diagnosis not present

## 2014-08-23 DIAGNOSIS — I4891 Unspecified atrial fibrillation: Secondary | ICD-10-CM | POA: Diagnosis present

## 2014-08-23 DIAGNOSIS — I739 Peripheral vascular disease, unspecified: Secondary | ICD-10-CM | POA: Insufficient documentation

## 2014-08-23 HISTORY — PX: CARDIOVERSION: SHX1299

## 2014-08-23 SURGERY — CARDIOVERSION
Anesthesia: General

## 2014-08-23 MED ORDER — SODIUM CHLORIDE 0.9 % IV SOLN
250.0000 mL | INTRAVENOUS | Status: DC
  Administered 2014-08-23: 10 mL via INTRAVENOUS
  Administered 2014-08-23: 11:00:00 via INTRAVENOUS

## 2014-08-23 MED ORDER — LIDOCAINE HCL (CARDIAC) 20 MG/ML IV SOLN
INTRAVENOUS | Status: DC | PRN
Start: 2014-08-23 — End: 2014-08-23
  Administered 2014-08-23: 40 mg via INTRAVENOUS

## 2014-08-23 MED ORDER — SODIUM CHLORIDE 0.9 % IV SOLN: 250.0000 mL | INTRAVENOUS | Status: DC

## 2014-08-23 MED ORDER — HYDROCORTISONE 1 % EX CREA
1.0000 "application " | TOPICAL_CREAM | Freq: Three times a day (TID) | CUTANEOUS | Status: DC | PRN
  Filled 2014-08-23: qty 28

## 2014-08-23 MED ORDER — SODIUM CHLORIDE 0.9 % IJ SOLN: 3.0000 mL | Freq: Two times a day (BID) | INTRAMUSCULAR | Status: DC

## 2014-08-23 MED ORDER — PROPOFOL 10 MG/ML IV BOLUS
INTRAVENOUS | Status: DC | PRN
  Administered 2014-08-23: 100 mg via INTRAVENOUS

## 2014-08-23 MED ORDER — SODIUM CHLORIDE 0.9 % IJ SOLN: 3.0000 mL | INTRAMUSCULAR | Status: DC | PRN

## 2014-08-23 MED ORDER — AMIODARONE HCL 200 MG PO TABS
200.0000 mg | ORAL_TABLET | Freq: Every day | ORAL | Status: DC
Start: 2014-08-23 — End: 2015-10-31

## 2014-08-23 NOTE — Transfer of Care (Signed)
Immediate Anesthesia Transfer of Care Note  Patient: Joseph [name]  Procedure(s) Performed: Procedure(s): CARDIOVERSION (N/A)  Patient Location: Endoscopy Unit  Anesthesia Type:General  Level of Consciousness: awake  Airway & Oxygen Therapy: Patient Spontanous Breathing and Patient connected to nasal cannula oxygen  Post-op Assessment: Report given to PACU RN, Post -op Vital signs reviewed and stable and Patient moving all extremities  Post vital signs: Reviewed and stable  Complications: No apparent anesthesia complications

## 2014-08-23 NOTE — Discharge Instructions (Signed)
Monitored Anesthesia Care Monitored anesthesia care is an anesthesia service for a medical procedure. Anesthesia is the loss of the ability to feel pain. It is produced by medicines called anesthetics. It may affect a small area of your body (local anesthesia), a large area of your body (regional anesthesia), or your entire body (general anesthesia). The need for monitored anesthesia care depends your procedure, your condition, and the potential need for regional or general anesthesia. It is often provided during procedures where:   General anesthesia may be needed if there are complications. This is because you need special care when you are under general anesthesia.   You will be under local or regional anesthesia. This is so that you are able to have higher levels of anesthesia if needed.   You will receive calming medicines (sedatives). This is especially the case if sedatives are given to put you in a semi-conscious state of relaxation (deep sedation). This is because the amount of sedative needed to produce this state can be hard to predict. Too much of a sedative can produce general anesthesia. Monitored anesthesia care is performed by one or more health care providers who have special training in all types of anesthesia. You will need to meet with these health care providers before your procedure. During this meeting, they will ask you about your medical history. They will also give you instructions to follow. (For example, you will need to stop eating and drinking before your procedure. You may also need to stop or change medicines you are taking.) During your procedure, your health care providers will stay with you. They will:   Watch your condition. This includes watching your blood pressure, breathing, and level of pain.   Diagnose and treat problems that occur.   Give medicines if they are needed. These may include calming medicines (sedatives) and anesthetics.   Make sure you are  comfortable.  Having monitored anesthesia care does not necessarily mean that you will be under anesthesia. It does mean that your health care providers will be able to manage anesthesia if you need it or if it occurs. It also means that you will be able to have a different type of anesthesia than you are having if you need it. When your procedure is complete, your health care providers will continue to watch your condition. They will make sure any medicines wear off before you are allowed to go home.  Document Released: 09/09/2005 Document Revised: 04/30/2014 Document Reviewed: 01/25/2013 [facility] Patient Information 2015 [place], [place]. This information is not intended to replace advice given to you by your health care provider. Make sure you discuss any questions you have with your health care provider. Electrical Cardioversion Electrical cardioversion is the delivery of a jolt of electricity to change the rhythm of the heart. Sticky patches or metal paddles are placed on the chest to deliver the electricity from a device. This is done to restore a normal rhythm. A rhythm that is too fast or not regular keeps the heart from pumping well. Electrical cardioversion is done in an emergency if:   There is low or no blood pressure as a result of the heart rhythm.   Normal rhythm must be restored as fast as possible to protect the brain and heart from further damage.   It may save a life. Cardioversion may be done for heart rhythms that are not immediately life threatening, such as atrial fibrillation or flutter, in which:   The heart is beating too fast or  is not regular.   Medicine to change the rhythm has not worked.   It is safe to wait in order to allow time for preparation.  Symptoms of the abnormal rhythm are bothersome.  The risk of stroke and other serious problems can be reduced. LET [facility] CARE PROVIDER KNOW ABOUT:   Any allergies you have.  All medicines you are taking,  including vitamins, herbs, eye drops, creams, and over-the-counter medicines.  Previous problems you or members of your family have had with the use of anesthetics.   Any blood disorders you have.   Previous surgeries you have had.   Medical conditions you have. RISKS AND COMPLICATIONS  Generally, this is a safe procedure. However, problems can occur and include:   Breathing problems related to the anesthetic used.  A blood clot that breaks free and travels to other parts of your body. This could cause a stroke or other problems. The risk of this is lowered by use of blood-thinning medicine (anticoagulant) prior to the procedure.  Cardiac arrest (rare). BEFORE THE PROCEDURE   You may have tests to detect blood clots in your heart and to evaluate heart function.  You may start taking anticoagulants so your blood does not clot as easily.   Medicines may be given to help stabilize your heart rate and rhythm. PROCEDURE  You will be given medicine through an IV tube to reduce discomfort and make you sleepy (sedative).   An electrical shock will be delivered. AFTER THE PROCEDURE Your heart rhythm will be watched to make sure it does not change.  Document Released: 12/04/2002 Document Revised: 04/30/2014 Document Reviewed: 06/28/2013 [facility] Patient Information 2015 [place], [place]. This information is not intended to replace advice given to you by your health care provider. Make sure you discuss any questions you have with your health care provider.

## 2014-08-23 NOTE — Anesthesia Postprocedure Evaluation (Signed)
  Anesthesia Post-op Note  Patient: Joseph [name]  Procedure(s) Performed: Procedure(s): CARDIOVERSION (N/A)  Patient Location: PACU  Anesthesia Type:General  Level of Consciousness: awake and alert   Airway and Oxygen Therapy: Patient Spontanous Breathing  Post-op Pain: none  Post-op Assessment: Post-op Vital signs reviewed, Patient's Cardiovascular Status Stable and Respiratory Function Stable  Post-op Vital Signs: Reviewed  Filed Vitals:   08/23/14 1144  BP: 104/65  Pulse: 49  Temp: 36.6 C  Resp: 21    Complications: No apparent anesthesia complications

## 2014-08-23 NOTE — Telephone Encounter (Signed)
Message copied by Lamar Laundry on Thu Aug 23, 2014  2:18 PM ------      Message from: Daneen Schick      Created: Tue Aug 21, 2014  5:30 PM       ok ------

## 2014-08-23 NOTE — CV Procedure (Signed)
Electrical Cardioversion Procedure Note Joseph [name] [id number] Feb 17, 1941  Procedure: Electrical Cardioversion Indications:  Atrial Fibrillation  Time Out: Verified patient identification, verified procedure,medications/allergies/relevent history reviewed, required imaging and test results available.  Performed  Procedure Details  The patient was NPO after midnight. Anesthesia was administered at the beside  by Dr.Fitzgerald with 100mg  of propofol and 40 mg Lidocaine..  Cardioversion was done with synchronized biphasic defibrillation with AP pads with 200watts.  The patient converted to normal sinus rhythm. The patient tolerated the procedure well.  IMPRESSION:  Successful cardioversion of atrial fibrillation to NSR.  Plan: Discharge today with f/u in 2 weeks and also to see Dr. Baxter Hire.    Joseph [name] 08/23/2014, 11:22 AM

## 2014-08-23 NOTE — Anesthesia Preprocedure Evaluation (Addendum)
Anesthesia Evaluation  Patient identified by MRN, date of birth, ID band Patient awake    Reviewed: Allergy & Precautions, H&P , NPO status , Patient's Chart, lab work & pertinent test results, reviewed documented beta blocker date and time   Airway Mallampati: III TM Distance: >3 FB Neck ROM: Full    Dental no notable dental hx. (+) Teeth Intact, Dental Advisory Given   Pulmonary neg pulmonary ROS,  breath sounds clear to auscultation  Pulmonary exam normal       Cardiovascular hypertension, Pt. on medications and Pt. on home beta blockers + Peripheral Vascular Disease Atrial Fibrillation Rhythm:Irregular Rate:Normal     Neuro/Psych  Headaches, Anxiety Depression    GI/Hepatic Neg liver ROS, GERD-  Medicated and Controlled,  Endo/Other  negative endocrine ROS  Renal/GU negative Renal ROS  negative genitourinary   Musculoskeletal   Abdominal   Peds  Hematology negative hematology ROS (+)   Anesthesia Other Findings   Reproductive/Obstetrics negative OB ROS                          Anesthesia Physical Anesthesia Plan  ASA: III  Anesthesia Plan: General   Post-op Pain Management:    Induction: Intravenous  Airway Management Planned: Mask  Additional Equipment:   Intra-op Plan:   Post-operative Plan:   Informed Consent: I have reviewed the patients History and Physical, chart, labs and discussed the procedure including the risks, benefits and alternatives for the proposed anesthesia with the patient or authorized representative who has indicated his/her understanding and acceptance.   Dental advisory given  Plan Discussed with: CRNA  Anesthesia Plan Comments:         Anesthesia Quick Evaluation

## 2014-08-23 NOTE — Telephone Encounter (Signed)
[email or web address] ok.results released to my chart

## 2014-08-24 ENCOUNTER — Encounter (HOSPITAL_COMMUNITY): Payer: Self-pay | Admitting: Interventional Cardiology

## 2014-08-30 ENCOUNTER — Ambulatory Visit: Payer: Medicare Other | Admitting: Interventional Cardiology

## 2014-09-06 ENCOUNTER — Encounter: Payer: Self-pay | Admitting: Interventional Cardiology

## 2014-09-06 ENCOUNTER — Ambulatory Visit (INDEPENDENT_AMBULATORY_CARE_PROVIDER_SITE_OTHER): Payer: Medicare Other | Admitting: Interventional Cardiology

## 2014-09-06 VITALS — BP 142/84 | HR 58 | Ht 71.0 in | Wt 266.0 lb

## 2014-09-06 DIAGNOSIS — Z7901 Long term (current) use of anticoagulants: Secondary | ICD-10-CM

## 2014-09-06 DIAGNOSIS — I4891 Unspecified atrial fibrillation: Secondary | ICD-10-CM

## 2014-09-06 DIAGNOSIS — I480 Paroxysmal atrial fibrillation: Secondary | ICD-10-CM

## 2014-09-06 DIAGNOSIS — I4892 Unspecified atrial flutter: Secondary | ICD-10-CM

## 2014-09-06 DIAGNOSIS — I10 Essential (primary) hypertension: Secondary | ICD-10-CM

## 2014-09-06 DIAGNOSIS — Z79899 Other long term (current) drug therapy: Secondary | ICD-10-CM

## 2014-09-06 MED ORDER — METOPROLOL SUCCINATE ER 100 MG PO TB24: 100.0000 mg | ORAL_TABLET | Freq: Every day | ORAL | Status: DC

## 2014-09-06 NOTE — Progress Notes (Signed)
Patient ID: Joseph [name], male   DOB: 08/16/1941, 73 y.o.   MRN: [id number]    [address], [address], [place]  [address] Phone: [phone] Fax:  [phone]  Date:  09/06/2014   ID:  Joseph [name], DOB February 23, 1941, MRN [id number]  PCP:  Reginia Naas, MD   ASSESSMENT:  1. Atrial flutter, now status post electrical cardioversion while on amiodarone therapy 2. Fatigue felt to be secondary to medical regimen 3. History of atrial fibrillation with ablation by Dr. Baxter Hire 4. Hypertension, controlled  PLAN:  1. Decrease metoprolol to 100 mg daily 2. Discussion about flutter ablation is indicated with Dr. Elonda Husky. 3. Continue amiodarone for the time being.  4. Clinical followup in one month   SUBJECTIVE: Joseph [name] is a 73 y.o. male who is doing well after cardioversion 08/23/2014 with the exception of feeling fatigued and having insomnia. He also has tinnitus that he feels is related to metoprolol. He has not had chest pain or bleeding on anticoagulation. He has not had syncope. No recurrence of palpitation or arrhythmia that he can detect symptomatically. He is not sleeping well.   Wt Readings from Last 3 Encounters:  09/06/14 266 lb (120.657 kg)  07/31/14 269 lb (122.018 kg)  07/17/14 269 lb 1.9 oz (122.072 kg)     Past Medical History  Diagnosis Date  . Depression   . Headache(784.0)   . GERD (gastroesophageal reflux disease)   . Allergy   . Hypertension   . Hyperlipidemia   . Thoracic aneurysm   . Colon polyp   . Atrial fibrillation   . Panic attacks   . Diverticulosis   . Atypical atrial flutter 07/13/2014    Diagnosis on EKG 07/13/14 : Cardioversion August 2015    Current Outpatient Prescriptions  Medication Sig Dispense Refill  . acetaminophen (TYLENOL) 500 MG tablet Take 500 mg by mouth every 6 (six) hours as needed for moderate pain or headache (headache).      . ALPRAZolam (XANAX) 0.5 MG tablet Take 0.5-1 mg by mouth 3 (three)  times daily as needed for sleep.      Marland Kitchen amiodarone (PACERONE) 200 MG tablet Take 1 tablet (200 mg total) by mouth daily.  180 tablet  3  . Brimonidine Tartrate (MIRVASO) 0.33 % GEL Apply 1 application topically daily.      . metoprolol succinate (TOPROL-XL) 100 MG 24 hr tablet Take 1 tablet (100 mg total) by mouth daily.      Marland Kitchen olmesartan (BENICAR) 40 MG tablet Take 40 mg by mouth daily.      Marland Kitchen OVER THE COUNTER MEDICATION Apply 1 drop to eye daily as needed (for dry eyes). Equate brand of eye lubricant drops      . pantoprazole (PROTONIX) 40 MG tablet Take 40 mg by mouth daily.      . potassium chloride SA (K-DUR,KLOR-CON) 20 MEQ tablet Take 1 tablet (20 mEq total) by mouth daily.  90 tablet  3  . rivaroxaban (XARELTO) 20 MG TABS tablet Take 1 tablet (20 mg total) by mouth daily with supper.  90 tablet  3  . rosuvastatin (CRESTOR) 10 MG tablet Take 10 mg by mouth once a week.      . zolpidem (AMBIEN) 10 MG tablet Take 5 mg by mouth daily as needed for sleep. For sleep      . [DISCONTINUED] famotidine (PEPCID AC) 10 MG chewable tablet Chew 10 mg by mouth as needed.  No current facility-administered medications for this visit.    Allergies:   No Known Allergies  Social History:  The patient  reports that he has never smoked. He has never used smokeless tobacco. He reports that he does not drink alcohol or use illicit drugs.   ROS:  Please see the history of present illness.   Seems depressed. Having weakness, insomnia, and tinnitus.   All other systems reviewed and negative.   OBJECTIVE: VS:  BP 142/84  Pulse 58  Ht 5\' 11"  (1.803 m)  Wt 266 lb (120.657 kg)  BMI 37.12 kg/m2 Well nourished, well developed, in no acute distress, obese HEENT: normal Neck: JVD flat. Carotid bruit absent  Cardiac:  normal S1, S2; RRR; no murmur Lungs:  clear to auscultation bilaterally, no wheezing, rhonchi or rales Abd: soft, nontender, no hepatomegaly Ext: Edema absent. Pulses 2+ Skin: warm and  dry Neuro:  CNs 2-12 intact, no focal abnormalities noted  EKG:  Normal sinus rhythm/sinus bradycardia with first-degree AV block and poor wave progression with left anterior hemiblock       Signed, Illene Labrador III, MD 09/06/2014 6:40 PM

## 2014-09-06 NOTE — Patient Instructions (Addendum)
Your physician has recommended you make the following change in your medication:  1) REDUCE Metoprolol 100mg  daily  Take all other medications as prescribed  Your physician recommends that you schedule a follow-up appointment in: 1 month on 10/05/14 @9am 

## 2014-09-21 ENCOUNTER — Other Ambulatory Visit: Payer: Self-pay | Admitting: Dermatology

## 2014-09-25 ENCOUNTER — Ambulatory Visit
Admission: RE | Admit: 2014-09-25 | Discharge: 2014-09-25 | Disposition: A | Discharge status: Home / Self Care | Payer: Medicare Other | Source: Ambulatory Visit | Attending: Family Medicine | Admitting: Family Medicine

## 2014-09-25 ENCOUNTER — Other Ambulatory Visit: Payer: Self-pay | Admitting: Family Medicine

## 2014-09-25 DIAGNOSIS — R110 Nausea: Secondary | ICD-10-CM

## 2014-09-27 ENCOUNTER — Encounter: Payer: Self-pay | Admitting: Interventional Cardiology

## 2014-10-02 ENCOUNTER — Encounter: Payer: Self-pay | Admitting: Diagnostic Neuroimaging

## 2014-10-02 ENCOUNTER — Ambulatory Visit (INDEPENDENT_AMBULATORY_CARE_PROVIDER_SITE_OTHER): Payer: Medicare Other | Admitting: Diagnostic Neuroimaging

## 2014-10-02 VITALS — BP 138/85 | HR 58 | Temp 97.7°F | Ht 69.0 in | Wt 260.6 lb

## 2014-10-02 DIAGNOSIS — T3995XA Adverse effect of unspecified nonopioid analgesic, antipyretic and antirheumatic, initial encounter: Secondary | ICD-10-CM

## 2014-10-02 DIAGNOSIS — R51 Headache: Secondary | ICD-10-CM

## 2014-10-02 DIAGNOSIS — R519 Headache, unspecified: Secondary | ICD-10-CM

## 2014-10-02 DIAGNOSIS — G4440 Drug-induced headache, not elsewhere classified, not intractable: Secondary | ICD-10-CM

## 2014-10-02 MED ORDER — GABAPENTIN 300 MG PO CAPS: 300.0000 mg | ORAL_CAPSULE | Freq: Every day | ORAL | Status: DC

## 2014-10-02 NOTE — Progress Notes (Signed)
[facility]  PATIENT: Joseph [name] DOB: [birth year]  REFERRING CLINICIAN:   HISTORY FROM: patient and wife REASON FOR VISIT: follow up   HISTORICAL  CHIEF COMPLAINT:  No chief complaint on file.   HISTORY OF PRESENT ILLNESS:   UPDATE 10/02/14: Since last visit, HA are stable. Still daily, pressure HA. No throbbing anymore. Had LP (normal opening pressure). Now taking tylenol 500mg  (3-6 tabs daily). Working on walking, nutrition and weight loss.  UPDATE 06/28/14: HA are stable. More "pressure sensation". Ringing/pulsating sound stable. Int blurred vision. Had kidney stones, so had to stop diamox. Now off xarelto per his electrophysiologist (Dr. Elonda Husky) because he has not had any recurrence of atrial fibrillation.  PRIOR HPI (03/28/14): 73 year old right-handed male here for evaluation of pseudotumor cerebri. Patient has history of hypertension, hypercholesteremia, atrial fibrillation, migraine, cardiac ablation, intermittent depression and anxiety. 2007 patient began to have intermittent headaches, flushing hot sensations over his head. And 2010 he began to develop ringing in the ears. 2011 he noticed a "pulsating" sensation in his ears. In addition patient developed retro-orbital pain, headache, pressure sensations. Symptoms seem to be relieved partially by placing ice on his head or spraying water on his forehead. Sometimes patient feels pressure sensation inside his skull. Over the same timeframe patient has had significant weight gain. In 2000 he weighed 210 pounds. 2005 he weighed 250 pounds. In 2009 he weighed 315 pounds. Over past 5 years she has gradually reduce weight, currently at 267 pounds. Patient has had ophthalmologic evaluation this during this time, most recently in January 2015 which showed no evidence of papilledema. 01/12/2012 patient had a "blurred vision" event following his ophthalmology examination.  Patient has had evaluation by another local  neurologist who tried him and periodically on topiramate for one month, with a severe side effect. Patient is also tried acetazolamide, up to 700 mg twice a day, with significant side effects. Patient is on xarelto anticoagulation currently and therefore has not had a spinal tap yet.  REVIEW OF SYSTEMS: Full 14 system review of systems performed and notable only for appetite change fatigue ringing in ears eye pain palpitation constipation frequent waking snoring back pain frequent urination dizziness headache nervousness anxiety heat intolerance excessive thirst flushing.   ALLERGIES: No Known Allergies  HOME MEDICATIONS: Outpatient Prescriptions Prior to Visit  Medication Sig Dispense Refill  . acetaminophen (TYLENOL) 500 MG tablet Take 500 mg by mouth every 6 (six) hours as needed for moderate pain or headache (headache).      . ALPRAZolam (XANAX) 0.5 MG tablet Take 0.5-1 mg by mouth 3 (three) times daily as needed for sleep.      Marland Kitchen amiodarone (PACERONE) 200 MG tablet Take 1 tablet (200 mg total) by mouth daily.  180 tablet  3  . Brimonidine Tartrate (MIRVASO) 0.33 % GEL Apply 1 application topically daily.      . metoprolol succinate (TOPROL-XL) 100 MG 24 hr tablet Take 1 tablet (100 mg total) by mouth daily.      Marland Kitchen olmesartan (BENICAR) 40 MG tablet Take 40 mg by mouth daily.      Marland Kitchen OVER THE COUNTER MEDICATION Apply 1 drop to eye daily as needed (for dry eyes). Equate brand of eye lubricant drops      . pantoprazole (PROTONIX) 40 MG tablet Take 40 mg by mouth 2 (two) times daily.       . potassium chloride SA (K-DUR,KLOR-CON) 20 MEQ tablet Take 1 tablet (20 mEq total) by mouth daily.  90 tablet  3  . rivaroxaban (XARELTO) 20 MG TABS tablet Take 1 tablet (20 mg total) by mouth daily with supper.  90 tablet  3  . rosuvastatin (CRESTOR) 10 MG tablet Take 10 mg by mouth once a week.      . zolpidem (AMBIEN) 10 MG tablet Take 5 mg by mouth daily as needed for sleep. For sleep       No  facility-administered medications prior to visit.    PAST MEDICAL HISTORY: Past Medical History  Diagnosis Date  . Depression   . Headache(784.0)   . GERD (gastroesophageal reflux disease)   . Allergy   . Hypertension   . Hyperlipidemia   . Thoracic aneurysm   . Colon polyp   . Atrial fibrillation   . Panic attacks   . Diverticulosis   . Atypical atrial flutter 07/13/2014    Diagnosis on EKG 07/13/14 : Cardioversion August 2015    PAST SURGICAL HISTORY: Past Surgical History  Procedure Laterality Date  . Appendectomy  1958  . Cardiac catheterization  2009  . Inguinal hernia repair      right  . Vasectomy    . Cardioversion  03/18/2012    Procedure: CARDIOVERSION;  Surgeon: [name], MD;  Location: [facility];  Service: Cardiovascular;  Laterality: N/A;  . Ablataion  07/06/2012  . Cardioversion N/A 08/23/2014    Procedure: CARDIOVERSION;  Surgeon: [name], MD;  Location[facility] ENDOSCOPY;  Service: Cardiovascular;  Laterality: N/A;    FAMILY HISTORY: Family History  Problem Relation Age of Onset  . Heart disease Mother 77    mother  . Heart attack Mother   . Cancer Father     lung  . Heart disease Father   . Kidney disease Brother 51    died age 37 heart attack  . Heart attack Brother   . Heart disease Maternal Grandfather     SOCIAL HISTORY:  History   Social History  . Marital Status: Married    Spouse Name: [name]    Number of Children: 2  . Years of Education: College   Occupational History  . Retired    Social History Main Topics  . Smoking status: Never Smoker   . Smokeless tobacco: Never Used  . Alcohol Use: No  . Drug Use: No  . Sexual Activity: Not on file   Other Topics Concern  . Not on file   Social History Narrative   Patient lives at home with spouse.   Caffeine Use: 1 cup daily     PHYSICAL EXAM  Filed Vitals:   10/02/14 1400  BP: 138/85  Pulse: 58  Temp: 97.7 F (36.5 C)  TempSrc: Oral  Height: 5'  9" (1.753 m)  Weight: 260 lb 9.6 oz (118.207 kg)    Not recorded    Body mass index is 38.47 kg/(m^2).  GENERAL EXAM: Patient is in no distress; well developed, nourished and groomed; neck is supple; OBESITY  CARDIOVASCULAR: Regular rate and rhythm, no murmurs, no carotid bruits  NEUROLOGIC: MENTAL STATUS: awake, alert, language fluent, comprehension intact, naming intact, fund of knowledge appropriate CRANIAL NERVE: no papilledema on fundoscopic exam, pupils equal and reactive to light, visual fields full to confrontation, extraocular muscles intact, no nystagmus, facial sensation and strength symmetric, hearing intact, palate elevates symmetrically, uvula midline, shoulder shrug symmetric, tongue midline. MOTOR: normal bulk and tone, full strength in the BUE, BLE SENSORY: normal and symmetric to light touch, temperature,  vibration COORDINATION: finger-nose-finger, fine finger movements normal REFLEXES: deep tendon reflexes TRACE and symmetric GAIT/STATION: narrow based gait; able to walk on tandem; romberg is negative    DIAGNOSTIC DATA (LABS, IMAGING, TESTING) - I reviewed patient records, labs, notes, testing and imaging myself where available.  Lab Results  Component Value Date   WBC 6.9 09/30/2013   HGB 15.8 09/30/2013   HCT 44.1 09/30/2013   MCV 83.8 09/30/2013   PLT 291 09/30/2013      Component Value Date/Time   NA 140 08/21/2014 0749   K 4.1 08/21/2014 0749   CL 104 08/21/2014 0749   CO2 28 08/21/2014 0749   GLUCOSE 115* 08/21/2014 0749   BUN 12 08/21/2014 0749   CREATININE 1.3 08/21/2014 0749   CALCIUM 9.3 08/21/2014 0749   PROT 7.0 09/30/2013 0333   ALBUMIN 4.1 09/30/2013 0333   AST 32 09/30/2013 0333   ALT 23 09/30/2013 0333   ALKPHOS 60 09/30/2013 0333   BILITOT 0.5 09/30/2013 0333   GFRNONAA 67* 09/30/2013 0333   GFRAA 78* 09/30/2013 0333   Lab Results  Component Value Date   CHOL 150 05/13/2011   HDL 46.40 05/13/2011   LDLCALC 86 05/13/2011   TRIG 87.0 05/13/2011    CHOLHDL 3 05/13/2011   No results found for this basename: HGBA1C   No results found for this basename: VITAMINB12   Lab Results  Component Value Date   TSH 2.223 02/26/2011    09/20/13 MRI BRAIN 1. No acute or focal abnormality to explain the patient's symptoms.  2. Scattered subcortical T2 hyperintensities are greater than expected for age. The finding is nonspecific but can be seen in the setting of chronic microvascular ischemia, a demyelinating process such as multiple sclerosis, vasculitis, complicated migraine headaches, or as the sequelae of a prior infectious or inflammatory process.  3. Remote lacunar infarct of the right cerebellum.  09/20/13 MRA/MRV HEAD 1. Moderate distal small vessel disease.  2. Mild narrowing of the distal left A1 segment.  3. No focal lesion to explain the patient's symptoms.  4. Normal MR venogram.  07/09/14 CSF studies - opening pressure 10 cm H2O, closing pressure 8 cm H2O; WBC 1, RBC 0, PROTEIN 57, GLUCOSE 64, GRAM STAIN/CULTURE NEGATIVE   ASSESSMENT AND PLAN  73 y.o. year old male here with progressive headache, pressure, blurred vision, tinnitus symptoms since 2007, in the setting of weight gain. Some migraine features also noted. Patient has been intolerant of topiramate and acetazolamide.  LP shows normal opening pressure.  Ddx: tension HA vs migraine variant vs analgesic overuse headache  PLAN: - trial of gabapentin 300mg  qhs - agree with gradual weight loss strategies  Return in about 3 months (around 01/02/2015).    Penni Bombard, MD 93/07/1016, 5:10 PM Certified in Neurology, Neurophysiology and Neuroimaging  [facility] [address], [place], [place] [address] [phone]

## 2014-10-02 NOTE — Patient Instructions (Signed)
Try gabapentin 300mg  at bedtime.  Try to taper tylenol use down.

## 2014-10-05 ENCOUNTER — Ambulatory Visit (INDEPENDENT_AMBULATORY_CARE_PROVIDER_SITE_OTHER): Payer: Medicare Other | Admitting: Interventional Cardiology

## 2014-10-05 ENCOUNTER — Encounter: Payer: Self-pay | Admitting: Interventional Cardiology

## 2014-10-05 VITALS — BP 126/82 | HR 59 | Ht 69.0 in | Wt 256.8 lb

## 2014-10-05 DIAGNOSIS — I10 Essential (primary) hypertension: Secondary | ICD-10-CM

## 2014-10-05 DIAGNOSIS — I484 Atypical atrial flutter: Secondary | ICD-10-CM

## 2014-10-05 DIAGNOSIS — I480 Paroxysmal atrial fibrillation: Secondary | ICD-10-CM

## 2014-10-05 DIAGNOSIS — E785 Hyperlipidemia, unspecified: Secondary | ICD-10-CM

## 2014-10-05 DIAGNOSIS — Z79899 Other long term (current) drug therapy: Secondary | ICD-10-CM

## 2014-10-05 NOTE — Progress Notes (Signed)
Patient ID: Joseph [name], male   DOB: 11-Feb-1941, 73 y.o.   MRN: [id number]    [address] [place], [place]  [address] Phone: [phone] Fax:  [phone]  Date:  10/05/2014   ID:  Joseph [name], DOB 1941/04/25, MRN [id number]  PCP:  Reginia Naas, MD   ASSESSMENT:  1. Atrial fibrillation and atrial flutter, prior arrhythmias. Recent cardioversion from atrial flutter. 2. Amiodarone therapy 3. Hypertension 4. History of thoracic aneurysm  PLAN:  1. Continue beta blocker therapy to provide protection against aneurysm expansion 2. Decrease amiodarone to 100 mg per day during the second week in December 3. Clinical followup with me in 6 months 4. In 6 months the patient needs a hepatic panel and TSH level   SUBJECTIVE: Joseph [name] is a 73 y.o. male is doing well. He saw Dr. Elonda Husky. No discussion of ablation occurred. They simply tell him to continue his current medical regimen. He has had no recurrence of arrhythmia. He is tolerating the current medical regimen without difficulty. No bleeding on anticoagulation therapy.   Wt Readings from Last 3 Encounters:  10/05/14 256 lb 12.8 oz (116.484 kg)  10/02/14 260 lb 9.6 oz (118.207 kg)  09/06/14 266 lb (120.657 kg)     Past Medical History  Diagnosis Date  . Depression   . Headache(784.0)   . GERD (gastroesophageal reflux disease)   . Allergy   . Hypertension   . Hyperlipidemia   . Thoracic aneurysm   . Colon polyp   . Atrial fibrillation   . Panic attacks   . Diverticulosis   . Atypical atrial flutter 07/13/2014    Diagnosis on EKG 07/13/14 : Cardioversion August 2015    Current Outpatient Prescriptions  Medication Sig Dispense Refill  . ALPRAZolam (XANAX) 0.5 MG tablet Take 0.5-1 mg by mouth 3 (three) times daily as needed for sleep.      Marland Kitchen amiodarone (PACERONE) 200 MG tablet Take 1 tablet (200 mg total) by mouth daily.  180 tablet  3  . Brimonidine Tartrate (MIRVASO) 0.33 % GEL Apply 1  application topically daily.      Marland Kitchen gabapentin (NEURONTIN) 300 MG capsule Take 1 capsule (300 mg total) by mouth at bedtime.  90 capsule  6  . metoprolol succinate (TOPROL-XL) 100 MG 24 hr tablet Take 1 tablet (100 mg total) by mouth daily.      Marland Kitchen olmesartan (BENICAR) 40 MG tablet Take 40 mg by mouth daily.      Marland Kitchen OVER THE COUNTER MEDICATION Apply 1 drop to eye daily as needed (for dry eyes). Equate brand of eye lubricant drops      . pantoprazole (PROTONIX) 40 MG tablet Take 40 mg by mouth 2 (two) times daily.       . potassium chloride SA (K-DUR,KLOR-CON) 20 MEQ tablet Take 1 tablet (20 mEq total) by mouth daily.  90 tablet  3  . rivaroxaban (XARELTO) 20 MG TABS tablet Take 1 tablet (20 mg total) by mouth daily with supper.  90 tablet  3  . rosuvastatin (CRESTOR) 10 MG tablet Take 10 mg by mouth once a week.      . zolpidem (AMBIEN) 10 MG tablet Take 5 mg by mouth daily as needed for sleep. For sleep      . [DISCONTINUED] famotidine (PEPCID AC) 10 MG chewable tablet Chew 10 mg by mouth as needed.         No current facility-administered medications for this visit.  Allergies:   No Known Allergies  Social History:  The patient  reports that he has never smoked. He has never used smokeless tobacco. He reports that he does not drink alcohol or use illicit drugs.   ROS:  Please see the history of present illness.   Not had syncope or palpitations.   All other systems reviewed and negative.   OBJECTIVE: VS:  BP 126/82  Pulse 59  Ht 5\' 9"  (1.753 m)  Wt 256 lb 12.8 oz (116.484 kg)  BMI 37.91 kg/m2 Well nourished, well developed, in no acute distress, obese HEENT: normal Neck: JVD flat. Carotid bruit absent  Cardiac:  normal S1, S2; RRR; no murmur Lungs:  clear to auscultation bilaterally, no wheezing, rhonchi or rales Abd: soft, nontender, no hepatomegaly Ext: Edema absent. Pulses 2+ Skin: warm and dry Neuro:  CNs 2-12 intact, no focal abnormalities noted  EKG:  Sinus bradycardia.  Otherwise normal.       Signed, Illene Labrador III, MD 10/05/2014 9:06 AM

## 2014-10-05 NOTE — Patient Instructions (Signed)
STARTING 2ND WEEK OF December DECREASE YOUR AMIODARONE TO 100 MG DAILY (1/2 TABLET OF 200 MG)  Your physician wants you to follow-up in: 6 MONTH OV/TSH/HFP You will receive a reminder letter in the mail two months in advance. If you don't receive a letter, please call our office to schedule the follow-up appointment.

## 2014-10-08 ENCOUNTER — Telehealth: Payer: Self-pay | Admitting: Gastroenterology

## 2014-10-08 NOTE — Telephone Encounter (Signed)
Patient reports a month of nausea and belching. He changed to a bland diet. This did not make much difference except he notes weight loss. He continues to have nausea, belching and now a sensation of food sticking. He was put on pantoprazole with only minimal change in his symptoms. Appointment made for evaluation.

## 2014-10-09 ENCOUNTER — Encounter: Payer: Self-pay | Admitting: Physician Assistant

## 2014-10-09 ENCOUNTER — Ambulatory Visit (INDEPENDENT_AMBULATORY_CARE_PROVIDER_SITE_OTHER): Payer: Medicare Other | Admitting: Physician Assistant

## 2014-10-09 ENCOUNTER — Telehealth: Payer: Self-pay | Admitting: *Deleted

## 2014-10-09 VITALS — BP 130/84 | HR 64 | Ht 69.0 in | Wt 255.4 lb

## 2014-10-09 DIAGNOSIS — R110 Nausea: Secondary | ICD-10-CM

## 2014-10-09 DIAGNOSIS — R1010 Upper abdominal pain, unspecified: Secondary | ICD-10-CM

## 2014-10-09 DIAGNOSIS — R143 Flatulence: Secondary | ICD-10-CM

## 2014-10-09 DIAGNOSIS — R1314 Dysphagia, pharyngoesophageal phase: Secondary | ICD-10-CM

## 2014-10-09 NOTE — Progress Notes (Signed)
Subjective:    Patient ID: Joseph [name], male    DOB: 1941/03/27, 73 y.o.   MRN: [id number]  HPI Joseph [name] is a pleasant 73 year old white male known to Dr. Deatra Ina from prior colonoscopy. This was done in November of 2012 with moderate diverticulosis and was otherwise normal exam. We have not seen him in GI since. He also has history of atrial fib and atrial flutter and underwent a recent cardioversion 08/23/2014. He is followed by Dr. Pernell Dupre. He is currently on amiodarone and Xarelto He has history of hypertension and a thoracic aneurysm as well as chronic GERD. Patient says his current symptoms started on September 12. He has had similar but milder episodes occasionally over the past couple of years. He says he had onset of nausea and decreased appetite. He put himself on a bland diet which he feels helps. If he eats anything other than very bland food he develops nausea and upper abdominal discomfort. He's been eating  3 meals per day but says he eats smaller amounts of the time and spreads mouth. Has not had any fever or chills. No vomiting. He did have some constipation at the onset which has since resolved, no melena or hematochezia. He says he feels gassy had been belching a lot and had a persistent "upset stomach". He was seen by his PCP and had labs done including CBC, lipase and CMET,which were unremarkable. His Protonix was increased to twice daily which he feels has helped a little bit. Not on any regular aspirin or NSAIDs, he has not started any new meds. He had been on Xarelto  previously and had no side effects, and has been on amiodarone for some time. He also has some solid food dysphagia. He says is frequently having to "washes food down" with fluids. No heartburn or indigestion. He has not had prior EGD.   Review of Systems  Constitutional: Positive for appetite change.  HENT: Positive for trouble swallowing.   Eyes: Negative.   Respiratory: Negative.   Cardiovascular:  Negative.   Gastrointestinal: Positive for nausea and abdominal pain.  Endocrine: Negative.   Genitourinary: Negative.   Musculoskeletal: Negative.   Skin: Negative.   Allergic/Immunologic: Negative.   Neurological: Negative.   Hematological: Negative.   Psychiatric/Behavioral: Negative.    Outpatient Prescriptions Prior to Visit  Medication Sig Dispense Refill  . ALPRAZolam (XANAX) 0.5 MG tablet Take 0.5-1 mg by mouth 3 (three) times daily as needed for sleep.      Marland Kitchen amiodarone (PACERONE) 200 MG tablet Take 1 tablet (200 mg total) by mouth daily.  180 tablet  3  . Brimonidine Tartrate (MIRVASO) 0.33 % GEL Apply 1 application topically daily.      Marland Kitchen gabapentin (NEURONTIN) 300 MG capsule Take 1 capsule (300 mg total) by mouth at bedtime.  90 capsule  6  . metoprolol succinate (TOPROL-XL) 100 MG 24 hr tablet Take 1 tablet (100 mg total) by mouth daily.      Marland Kitchen olmesartan (BENICAR) 40 MG tablet Take 40 mg by mouth daily.      Marland Kitchen OVER THE COUNTER MEDICATION Apply 1 drop to eye daily as needed (for dry eyes). Equate brand of eye lubricant drops      . pantoprazole (PROTONIX) 40 MG tablet Take 40 mg by mouth 2 (two) times daily.       . potassium chloride SA (K-DUR,KLOR-CON) 20 MEQ tablet Take 1 tablet (20 mEq total) by mouth daily.  90 tablet  3  .  rivaroxaban (XARELTO) 20 MG TABS tablet Take 1 tablet (20 mg total) by mouth daily with supper.  90 tablet  3  . rosuvastatin (CRESTOR) 10 MG tablet Take 10 mg by mouth once a week.      . zolpidem (AMBIEN) 10 MG tablet Take 5 mg by mouth daily as needed for sleep. For sleep       No facility-administered medications prior to visit.   No Known Allergies Patient Active Problem List   Diagnosis Date Noted  . On amiodarone therapy 07/17/2014  . Atypical atrial flutter 07/13/2014  . Hypokalemia 10/04/2013  . Personal history of colonic polyps 05/28/2011  . Encounter for long-term (current) use of anticoagulants 05/28/2011  . Atrial fibrillation  05/13/2011  . Hypertension 02/12/2011  . Aneurysm of thoracic aorta 02/12/2011  . Hyperlipemia 02/12/2011  . GERD (gastroesophageal reflux disease) 02/12/2011   History  Substance Use Topics  . Smoking status: Never Smoker   . Smokeless tobacco: Never Used  . Alcohol Use: No   family history includes Heart attack in his brother and mother; Heart disease in his father and maternal grandfather; Heart disease (age of onset: 59) in his mother; Kidney disease (age of onset: 69) in his brother; Lung cancer in his father.     Objective:   Physical Exam   Well-developed older white male in no acute distress, pleasant accompanied by his wife blood pressure one thirty over four pulse 64 height 5 foot 9 weight 255. HEENT; nontraumatic normocephalic EOMI PERRLA sclera anicteric, Supple; no JVD, Cardiovascular; regular rate and rhythm with S1-S2 no murmur or gallop, Pulmonary; clear bilaterally, Abdomen; large soft mildly tender in the right mid quadrant and left lower quadrant there is no guarding or rebound no palpable mass or hepatosplenomegaly bowel sounds present, Rectal ;exam not done, Extremities; no clubbing cyanosis or edema skin warm and dry, Psych ;mood and affect appropriate       Assessment & Plan:  #46  73 year old male with one month history of nausea ,decrease in appetite upper abdominal discomfort ,gas, belching.. Etiology is not clear. He has been maintained on PPI therapy, will need to rule out gastropathy, peptic ulcer disease, gallbladder disease or other intra-abdominal inflammatory process #2 diverticulosis #3 solid food dysphagia rule out esophageal stricture in patient with chronic GERD #4 anti-coagulation on Xarelto #5 history of atrial fib and flutter -status post cardioversion August 2015 #6 hypertension #7 history of thoracic aneurysm  Plan; cCntinue Protonix 40 mg twice daily for now  add probiotic once daily- suggested Align or Restora Add trial of Bentyl 10 mg every  6-8 hours when necessary abdominal bloating and cramping Schedule for CT scan of the abdomen and pelvis with contrast Will schedule for EGD with possible dilation with Dr. Deatra Ina. Patient would need to stop  Xarelto  48 hours prior to the procedure and we'll obtain consent from his cardiologist Dr. Pernell Dupre. Patient has had a recent cardioversion and cardiology may prefer he wait  a bit longer before interrupting Xarelto -will defer to their judgment. Further workup pending results of above

## 2014-10-09 NOTE — Patient Instructions (Signed)
You have been scheduled for an endoscopy. Please follow written instructions given to you at your visit today. If you use inhalers (even only as needed), please bring them with you on the day of your procedure. Your physician has requested that you go to www.startemmi.com and enter the access code given to you at your visit today. This web site gives a general overview about your procedure. However, you should still follow specific instructions given to you by our office regarding your preparation for the procedure   You have been scheduled for a CT scan of the abdomen and pelvis at [facility] ([address]---this is in the same building as Press [facility]).   You are scheduled on 10-11-2014 at 2:00 PM . You should arrive 1:45 PM  prior to your appointment time for registration. Please follow the written instructions below on the day of your exam:  WARNING: IF YOU ARE ALLERGIC TO IODINE/X-RAY DYE, PLEASE NOTIFY RADIOLOGY IMMEDIATELY AT [id number]! YOU WILL BE GIVEN A 13 HOUR PREMEDICATION PREP.  1) Do not eat or drink anything after 10:00 am  (4 hours prior to your test) 2) You have been given 2 bottles of oral contrast to drink. The solution may taste better if refrigerated, but do NOT add ice or any other liquid to this solution. Shake well before drinking.    Drink 1 bottle of contrast @ 12:00 Noon (2 hours prior to your exam)  Drink 1 bottle of contrast @ 1:00 PM  (1 hour prior to your exam)  You may take any medications as prescribed with a small amount of water except for the following: Metformin, Glucophage, Glucovance, Avandamet, Riomet, Fortamet, Actoplus Met, Janumet, Glumetza or Metaglip. The above medications must be held the day of the exam AND 48 hours after the exam.  The purpose of you drinking the oral contrast is to aid in the visualization of your intestinal tract. The contrast solution may cause some diarrhea. Before your exam is started, you will be given  a small amount of fluid to drink. Depending on your individual set of symptoms, you may also receive an intravenous injection of x-ray contrast/dye. Plan on being at [facility] for 30 minutes or long, depending on the type of exam you are having performed.  If you have any questions regarding your exam or if you need to reschedule, you may call the CT department at [phone] between the hours of 8:00 am and 5:00 pm, Monday-Friday.  ________________________________________________________________________

## 2014-10-10 NOTE — Progress Notes (Signed)
Reviewed and agree with management. Silver D. Latoia Eyster, M.D., FACG  

## 2014-10-11 ENCOUNTER — Ambulatory Visit (INDEPENDENT_AMBULATORY_CARE_PROVIDER_SITE_OTHER)
Admission: RE | Admit: 2014-10-11 | Discharge: 2014-10-11 | Disposition: A | Discharge status: Home / Self Care | Payer: Medicare Other | Source: Ambulatory Visit | Attending: Physician Assistant | Admitting: Physician Assistant

## 2014-10-11 DIAGNOSIS — R1314 Dysphagia, pharyngoesophageal phase: Secondary | ICD-10-CM

## 2014-10-11 DIAGNOSIS — R143 Flatulence: Secondary | ICD-10-CM

## 2014-10-11 DIAGNOSIS — R110 Nausea: Secondary | ICD-10-CM

## 2014-10-11 DIAGNOSIS — R1010 Upper abdominal pain, unspecified: Secondary | ICD-10-CM

## 2014-10-11 MED ORDER — IOHEXOL 300 MG/ML  SOLN
100.0000 mL | Freq: Once | INTRAMUSCULAR | Status: AC | PRN
  Administered 2014-10-11: 100 mL via INTRAVENOUS

## 2014-10-12 ENCOUNTER — Telehealth: Payer: Self-pay | Admitting: *Deleted

## 2014-10-12 NOTE — Telephone Encounter (Signed)
Patient states Joseph [name], [place] was going to give him an rx for his stomach. His pharmacy has not received it. Please, advise.

## 2014-10-15 ENCOUNTER — Telehealth: Payer: Self-pay | Admitting: Interventional Cardiology

## 2014-10-15 MED ORDER — ALIGN PO CAPS: 1.0000 | ORAL_CAPSULE | Freq: Every day | ORAL | Status: DC

## 2014-10-15 MED ORDER — DICYCLOMINE HCL 10 MG PO CAPS: ORAL_CAPSULE | ORAL | Status: DC

## 2014-10-15 NOTE — Telephone Encounter (Signed)
LM for Joseph [name] at Dr Mallie Mussel [facility] office. [facility].  We need xarelto clearance for colonoscopy.

## 2014-10-15 NOTE — Telephone Encounter (Signed)
New message      Having colonoscopy on 10-22-14.  Can he stop xeralto?

## 2014-10-15 NOTE — Telephone Encounter (Signed)
Stop Xarelto 48-72 hours before colonoscopy

## 2014-10-15 NOTE — Telephone Encounter (Signed)
Stop Xarelto 48-72 hours before colonoscopy.

## 2014-10-15 NOTE — Telephone Encounter (Signed)
Pam, GI requesting pt hold Xarelto 24-48 hours prior to colonoscopy.  GI requesting  Dr Thompson Caul recommendations about holding Xarelto prior to colonoscopy. Pam is aware that I will forward to Dr Tamala Julian for review and recommendations.

## 2014-10-15 NOTE — Telephone Encounter (Signed)
I called and advised the patient per Dr. Tamala Julian, to hold the Xarelto on 10-24,Sat. , Sun 10-25, Mon 10-26, and resume it on 10-23-2014.

## 2014-10-15 NOTE — Telephone Encounter (Signed)
Rx sent to pharmacy. Left a message for patient to call back. 

## 2014-10-15 NOTE — Telephone Encounter (Signed)
My note indicated bentyl 10 mg ,q 6 hours as needed for cramping .bloating  As a trial, and to add a probiotic such as Align one daily... Not sure why rx didn't get to pharmacy... Can give him 40 /2 refills

## 2014-10-15 NOTE — Telephone Encounter (Signed)
Pam advised.

## 2014-10-16 NOTE — Telephone Encounter (Signed)
Spoke with patient and gave him recommendations. 

## 2014-10-19 ENCOUNTER — Encounter: Payer: Self-pay | Admitting: Gastroenterology

## 2014-10-22 ENCOUNTER — Ambulatory Visit (AMBULATORY_SURGERY_CENTER): Payer: Medicare Other | Admitting: Gastroenterology

## 2014-10-22 ENCOUNTER — Encounter: Payer: Self-pay | Admitting: Gastroenterology

## 2014-10-22 VITALS — BP 128/78 | HR 49 | Temp 97.4°F | Resp 14 | Ht 69.0 in | Wt 255.0 lb

## 2014-10-22 DIAGNOSIS — K222 Esophageal obstruction: Secondary | ICD-10-CM

## 2014-10-22 DIAGNOSIS — K209 Esophagitis, unspecified without bleeding: Secondary | ICD-10-CM

## 2014-10-22 DIAGNOSIS — R1314 Dysphagia, pharyngoesophageal phase: Secondary | ICD-10-CM

## 2014-10-22 DIAGNOSIS — R1319 Other dysphagia: Secondary | ICD-10-CM

## 2014-10-22 DIAGNOSIS — R1310 Dysphagia, unspecified: Secondary | ICD-10-CM

## 2014-10-22 MED ORDER — SODIUM CHLORIDE 0.9 % IV SOLN: 500.0000 mL | INTRAVENOUS | Status: DC

## 2014-10-22 NOTE — Progress Notes (Signed)
Procedure ends, to recovery, report given and VSS. 

## 2014-10-22 NOTE — Patient Instructions (Addendum)
YOU HAD AN ENDOSCOPIC PROCEDURE TODAY AT [facility]: Refer to the procedure report that was given to you for any specific questions about what was found during the examination.  If the procedure report does not answer your questions, please call your gastroenterologist to clarify.  If you requested that your care partner not be given the details of your procedure findings, then the procedure report has been included in a sealed envelope for you to review at your convenience later.  YOU SHOULD EXPECT: Some feelings of bloating in the abdomen. Passage of more gas than usual.  Walking can help get rid of the air that was put into your GI tract during the procedure and reduce the bloating. If you had a lower endoscopy (such as a colonoscopy or flexible sigmoidoscopy) you may notice spotting of blood in your stool or on the toilet paper. If you underwent a bowel prep for your procedure, then you may not have a normal bowel movement for a few days.  DIET: See dilation diet handout -Drink plenty of fluids but you should avoid alcoholic beverages for 24 hours.  ACTIVITY: Your care partner should take you home directly after the procedure.  You should plan to take it easy, moving slowly for the rest of the day.  You can resume normal activity the day after the procedure however you should NOT DRIVE or use heavy machinery for 24 hours (because of the sedation medicines used during the test).    SYMPTOMS TO REPORT IMMEDIATELY: A gastroenterologist can be reached at any hour.  During normal business hours, 8:30 AM to 5:00 PM Monday through Friday, call [phone].  After hours and on weekends, please call the GI answering service at [phone] who will take a message and have the physician on call contact you.   Following upper endoscopy (EGD)  Vomiting of blood or coffee ground material  New chest pain or pain under the shoulder blades  Painful or persistently difficult swallowing  New  shortness of breath  Fever of 100F or higher  Black, tarry-looking stools  FOLLOW UP: If any biopsies were taken you will be contacted by phone or by letter within the next 1-3 weeks.  Call your gastroenterologist if you have not heard about the biopsies in 3 weeks.  Our staff will call the home number listed on your records the next business day following your procedure to check on you and address any questions or concerns that you may have at that time regarding the information given to you following your procedure. This is a courtesy call and so if there is no answer at the home number and we have not heard from you through the emergency physician on call, we will assume that you have returned to your regular daily activities without incident.  SIGNATURES/CONFIDENTIALITY: You and/or your care partner have signed paperwork which will be entered into your electronic medical record.  These signatures attest to the fact that that the information above on your After Visit Summary has been reviewed and is understood.  Full responsibility of the confidentiality of this discharge information lies with you and/or your care-partner.  Resume xarelto in am 10/23/14  Dilation diet, esophagitis-handouts given  Continue pantoprazole.

## 2014-10-22 NOTE — Progress Notes (Signed)
Called to room to assist during endoscopic procedure.  Patient ID and intended procedure confirmed with present staff. Received instructions for my participation in the procedure from the performing physician.  

## 2014-10-22 NOTE — Op Note (Signed)
[place]  [address] [place], [id number]   ENDOSCOPY WITH DILATION PROCEDURE REPORT  PATIENT: [name], Joseph  MR#: [id number] BIRTHDATE: June 27, 1941 , 73  yrs. old GENDER: male ENDOSCOPIST: Inda Castle, MD ASSISTANT: REFERRED WL:[id number] Tamala Julian, M.D. PROCEDURE DATE:  11/21/2014 PROCEDURE:   EGD, diagnostic and Maloney dilation of esophagus ASA CLASS:   Class II INDICATIONS:dyspepsia and dysphagia. MEDICATIONS: Monitored anesthesia care and Propofol 160 mg IV TOPICAL ANESTHETIC:  DESCRIPTION OF PROCEDURE:   After the risks benefits and alternatives of the procedure were thoroughly explained, informed consent was obtained.  The LB [id number]  endoscope was introduced through the mouth  and advanced to the second portion of the duodenum , limited by Without limitations.   The instrument was slowly withdrawn as the mucosa was carefully examined.    ESOPHAGUS: There was a short peptic and benign appearing stricture at the gastroesophageal junction.  The stricture was easily traversable.  The stricture was dilated using a 11mm (54Fr) Maloney dilator.   There was LA Class A esophagitis (One or more mucosal breaks < 75mm, but without continuity across mucosal folds) noted. Except for the findings listed the EGD was otherwise normal. Dilation was then performed at the gastroesophageal junction  Dilator:Maloney WIOM:35DH  COMPLICATIONS: There were no immediate complications.  ENDOSCOPIC IMPRESSION: 1.   There was a short stricture at the gastroesophageal junction; The stricture was dilated using a 41mm (52Fr) Maloney dilator 2.   There was LA Class B esophagitis noted 3.   EGD was otherwise normal  RECOMMENDATIONS: Continue PPI Resume xarelto in am  eSigned:  Inda Castle, MD 21-Nov-2014 10:56 AM  CC:  CPT CODES: ICD CODES:  The ICD and CPT codes recommended by this software are interpretations from the data that the clinical staff has  captured with the software.  The verification of the translation of this report to the ICD and CPT codes and modifiers is the sole responsibility of the health care institution and practicing physician where this report was generated.  [employer] will not be held responsible for the validity of the ICD and CPT codes included on this report.  AMA assumes no liability for data contained or not contained herein. CPT is a Designer, television/film set of the [employer].  PATIENT NAME:  [name], Joseph MR#: [id number]

## 2014-10-23 ENCOUNTER — Telehealth: Payer: Self-pay | Admitting: *Deleted

## 2014-10-23 NOTE — Telephone Encounter (Signed)
  Follow up Call-  Call back number 10/22/2014  Post procedure Call Back phone  # [phone]  Permission to leave phone message Yes     Patient questions:  Do you have a fever, pain , or abdominal swelling? No. Pain Score  0 *  Have you tolerated food without any problems? Yes.    Have you been able to return to your normal activities? Yes.    Do you have any questions about your discharge instructions: Diet   No. Medications  No. Follow up visit  No.  Do you have questions or concerns about your Care? No.  Actions: * If pain score is 4 or above: No action needed, pain <4.

## 2014-11-20 ENCOUNTER — Telehealth: Payer: Self-pay | Admitting: *Deleted

## 2014-11-20 NOTE — Telephone Encounter (Signed)
Spoke to spouse. Advised patient should not just stop medication, will fwd to Dr. Leta Baptist for plan. Spouse agreed.

## 2014-11-20 NOTE — Telephone Encounter (Signed)
I spoke with patient. Can stop gabapentin. Also advised him to consider second opinions for flushing / heat sensation eval with academic center, specifically with endocrinology, allergy and rheumatology specialists. He will think about it.  Penni Bombard, MD 68/61/6837, 2:90 PM Certified in Neurology, Neurophysiology and Neuroimaging  [facility] [address], [place], [place] [address] [phone]

## 2014-11-26 ENCOUNTER — Telehealth: Payer: Self-pay | Admitting: *Deleted

## 2014-11-26 NOTE — Telephone Encounter (Signed)
Returned pt call.pt is currently 2 mo post dccv. Pt reports that he has maintained NSR.pt is concerned because he has been having increased blood in his stool.pt would like to know if Dr.Smith would recommend switching agents, or should he d/c anticoag.adv him i will fwd a message to Dr.Smith and call back with his recommendations.pt agreeable

## 2014-11-26 NOTE — Telephone Encounter (Signed)
New Message   Pt wanted to discuss his complications with Alen Blew; come off?, please call and advise.

## 2014-11-26 NOTE — Telephone Encounter (Signed)
Pt given Dr.Smith recommendations.Ok to stop Xarelto. Pt is to f/u with pcp for the blood in his stool pt does have hemorrhoids and bleedng could be related to that. Pt verbalized understanding.pt adv that I will clarify if Dr.Smith would recommend he take a low dose Asa 81mg , I will call back with his response.

## 2014-11-28 NOTE — Telephone Encounter (Signed)
Advised the patient not to decrease amiodarone dose while he is off anticoagulation.

## 2014-11-28 NOTE — Telephone Encounter (Signed)
Pt aware of Dr.Smith's recommendations.patient  Is not to decrease amiodarone dose while he is off anticoagulation.pt will f/u with pcp for blood in his stool. Pt adv not to begin ASA until that is resolved.

## 2014-12-11 ENCOUNTER — Ambulatory Visit (INDEPENDENT_AMBULATORY_CARE_PROVIDER_SITE_OTHER): Payer: Medicare Other | Admitting: Diagnostic Neuroimaging

## 2014-12-11 ENCOUNTER — Encounter: Payer: Self-pay | Admitting: Diagnostic Neuroimaging

## 2014-12-11 VITALS — BP 132/75 | HR 60 | Temp 95.6°F | Ht 69.0 in | Wt 240.4 lb

## 2014-12-11 DIAGNOSIS — IMO0002 Reserved for concepts with insufficient information to code with codable children: Secondary | ICD-10-CM

## 2014-12-11 DIAGNOSIS — H9313 Tinnitus, bilateral: Secondary | ICD-10-CM

## 2014-12-11 DIAGNOSIS — T3995XA Adverse effect of unspecified nonopioid analgesic, antipyretic and antirheumatic, initial encounter: Secondary | ICD-10-CM

## 2014-12-11 DIAGNOSIS — R51 Headache: Secondary | ICD-10-CM

## 2014-12-11 DIAGNOSIS — R519 Headache, unspecified: Secondary | ICD-10-CM

## 2014-12-11 DIAGNOSIS — G43709 Chronic migraine without aura, not intractable, without status migrainosus: Secondary | ICD-10-CM

## 2014-12-11 DIAGNOSIS — G4440 Drug-induced headache, not elsewhere classified, not intractable: Secondary | ICD-10-CM

## 2014-12-11 NOTE — Progress Notes (Signed)
[facility]  PATIENT: Joseph [name] DOB: 09/01/1941  REFERRING CLINICIAN:   HISTORY FROM: patient and wife REASON FOR VISIT: follow up   HISTORICAL  CHIEF COMPLAINT:  Chief Complaint  Patient presents with  . Follow-up    Headache    HISTORY OF PRESENT ILLNESS:   UPDATE 12/11/14: Since last visit, has reduced tylenol to 2 tabs daily. Still with daily headache, frontal, left temporal, with photo/phonophobia. No nausea or vomiting. Planning to see psychiatry later this month.   UPDATE 10/02/14: Since last visit, HA are stable. Still daily, pressure HA. No throbbing anymore. Had LP (normal opening pressure). Now taking tylenol 500mg  (3-6 tabs daily). Working on walking, nutrition and weight loss.  UPDATE 06/28/14: HA are stable. More "pressure sensation". Ringing/pulsating sound stable. Int blurred vision. Had kidney stones, so had to stop diamox. Now off xarelto per his electrophysiologist (Dr. Elonda Husky) because he has not had any recurrence of atrial fibrillation.  PRIOR HPI (03/28/14): 73 year old right-handed male here for evaluation of pseudotumor cerebri. Patient has history of hypertension, hypercholesteremia, atrial fibrillation, migraine, cardiac ablation, intermittent depression and anxiety. 2007 patient began to have intermittent headaches, flushing hot sensations over his head. And 2010 he began to develop ringing in the ears. 2011 he noticed a "pulsating" sensation in his ears. In addition patient developed retro-orbital pain, headache, pressure sensations. Symptoms seem to be relieved partially by placing ice on his head or spraying water on his forehead. Sometimes patient feels pressure sensation inside his skull. Over the same timeframe patient has had significant weight gain. In 2000 he weighed 210 pounds. 2005 he weighed 250 pounds. In 2009 he weighed 315 pounds. Over past 5 years she has gradually reduce weight, currently at 267 pounds. Patient has had  ophthalmologic evaluation this during this time, most recently in January 2015 which showed no evidence of papilledema. 01/12/2012 patient had a "blurred vision" event following his ophthalmology examination.  Patient has had evaluation by another local neurologist who tried him and periodically on topiramate for one month, with a severe side effect. Patient is also tried acetazolamide, up to 700 mg twice a day, with significant side effects. Patient is on xarelto anticoagulation currently and therefore has not had a spinal tap yet.  REVIEW OF SYSTEMS: Full 14 system review of systems performed and notable only for appetite change fatigue ringing in ears eye pain palpitation constipation frequent waking snoring back pain frequent urination dizziness headache nervousness anxiety heat intolerance excessive thirst flushing.   ALLERGIES: No Known Allergies  HOME MEDICATIONS: Outpatient Prescriptions Prior to Visit  Medication Sig Dispense Refill  . ALPRAZolam (XANAX) 0.5 MG tablet Take 0.5-1 mg by mouth 3 (three) times daily as needed for sleep.    Marland Kitchen amiodarone (PACERONE) 200 MG tablet Take 1 tablet (200 mg total) by mouth daily. 180 tablet 3  . bifidobacterium infantis (ALIGN) capsule Take 1 capsule by mouth daily. 14 capsule 0  . Brimonidine Tartrate (MIRVASO) 0.33 % GEL Apply 1 application topically daily.    . metoprolol succinate (TOPROL-XL) 100 MG 24 hr tablet Take 1 tablet (100 mg total) by mouth daily.    Marland Kitchen olmesartan (BENICAR) 40 MG tablet Take 40 mg by mouth daily.    Marland Kitchen OVER THE COUNTER MEDICATION Apply 1 drop to eye daily as needed (for dry eyes). Equate brand of eye lubricant drops    . pantoprazole (PROTONIX) 40 MG tablet Take 40 mg by mouth 2 (two) times daily.     . rosuvastatin (  CRESTOR) 10 MG tablet Take 10 mg by mouth once a week.    . zolpidem (AMBIEN) 10 MG tablet Take 5 mg by mouth daily as needed for sleep. For sleep    . dicyclomine (BENTYL) 10 MG capsule Take one po every 6  hours prn cramping, bloating. 40 capsule 2  . gabapentin (NEURONTIN) 300 MG capsule Take 1 capsule (300 mg total) by mouth at bedtime. 90 capsule 6  . potassium chloride SA (K-DUR,KLOR-CON) 20 MEQ tablet Take 1 tablet (20 mEq total) by mouth daily. 90 tablet 3  . rivaroxaban (XARELTO) 20 MG TABS tablet Take 1 tablet (20 mg total) by mouth daily with supper. 90 tablet 3   No facility-administered medications prior to visit.    PAST MEDICAL HISTORY: Past Medical History  Diagnosis Date  . Depression   . Headache(784.0)   . GERD (gastroesophageal reflux disease)   . Allergy   . Hypertension   . Hyperlipidemia   . Thoracic aneurysm   . Colon polyp   . Atrial fibrillation   . Panic attacks   . Diverticulosis   . Atypical atrial flutter 07/13/2014    Diagnosis on EKG 07/13/14 : Cardioversion August 2015  . Atrial fibrillation     PAST SURGICAL HISTORY: Past Surgical History  Procedure Laterality Date  . Appendectomy  1958  . Cardiac catheterization  2009  . Inguinal hernia repair      right  . Vasectomy    . Cardioversion  03/18/2012    Procedure: CARDIOVERSION;  Surgeon: [name], MD;  Location: [facility];  Service: Cardiovascular;  Laterality: N/A;  . Ablataion  07/06/2012  . Cardioversion N/A 08/23/2014    Procedure: CARDIOVERSION;  Surgeon: [name], MD;  [facility] ENDOSCOPY;  Service: Cardiovascular;  Laterality: N/A;  . Spinal tap      FAMILY HISTORY: Family History  Problem Relation Age of Onset  . Heart disease Mother 5    mother  . Heart attack Mother   . Lung cancer Father   . Heart disease Father   . Kidney disease Brother 35    died age 57 heart attack  . Heart attack Brother   . Heart disease Maternal Grandfather     SOCIAL HISTORY:  History   Social History  . Marital Status: Married    Spouse Name: [name]    Number of Children: 2  . Years of Education: College   Occupational History  . Retired    Social History  Main Topics  . Smoking status: Never Smoker   . Smokeless tobacco: Never Used  . Alcohol Use: No  . Drug Use: No  . Sexual Activity: Not on file   Other Topics Concern  . Not on file   Social History Narrative   Patient lives at home with spouse.   Caffeine Use: 1 cup daily     PHYSICAL EXAM  Filed Vitals:   12/11/14 1402  BP: 132/75  Pulse: 60  Temp: 95.6 F (35.3 C)  TempSrc: Oral  Height: 5\' 9"  (1.753 m)  Weight: 240 lb 6.4 oz (109.045 kg)    Not recorded      Body mass index is 35.48 kg/(m^2).  GENERAL EXAM: Patient is in no distress; well developed, nourished and groomed; neck is supple; OBESITY  NEUROLOGIC: MENTAL STATUS: awake, alert, language fluent, comprehension intact, naming intact, fund of knowledge appropriate CRANIAL NERVE: visual fields full to confrontation, extraocular muscles intact, no nystagmus, facial  sensation and strength symmetric, hearing intact, palate elevates symmetrically, uvula midline, shoulder shrug symmetric, tongue midline. MOTOR: normal bulk and tone, full strength in the BUE, BLE SENSORY: normal and symmetric to light touch GAIT/STATION: narrow based gait    DIAGNOSTIC DATA (LABS, IMAGING, TESTING) - I reviewed patient records, labs, notes, testing and imaging myself where available.  Lab Results  Component Value Date   WBC 6.9 09/30/2013   HGB 15.8 09/30/2013   HCT 44.1 09/30/2013   MCV 83.8 09/30/2013   PLT 291 09/30/2013      Component Value Date/Time   NA 140 08/21/2014 0749   K 4.1 08/21/2014 0749   CL 104 08/21/2014 0749   CO2 28 08/21/2014 0749   GLUCOSE 115* 08/21/2014 0749   BUN 12 08/21/2014 0749   CREATININE 1.3 08/21/2014 0749   CALCIUM 9.3 08/21/2014 0749   PROT 7.0 09/30/2013 0333   ALBUMIN 4.1 09/30/2013 0333   AST 32 09/30/2013 0333   ALT 23 09/30/2013 0333   ALKPHOS 60 09/30/2013 0333   BILITOT 0.5 09/30/2013 0333   GFRNONAA 67* 09/30/2013 0333   GFRAA 78* 09/30/2013 0333   Lab Results    Component Value Date   CHOL 150 05/13/2011   HDL 46.40 05/13/2011   LDLCALC 86 05/13/2011   TRIG 87.0 05/13/2011   CHOLHDL 3 05/13/2011   No results found for: HGBA1C No results found for: VITAMINB12 Lab Results  Component Value Date   TSH 2.223 02/26/2011    09/20/13 MRI BRAIN 1. No acute or focal abnormality to explain the patient's symptoms.  2. Scattered subcortical T2 hyperintensities are greater than expected for age. The finding is nonspecific but can be seen in the setting of chronic microvascular ischemia, a demyelinating process such as multiple sclerosis, vasculitis, complicated migraine headaches, or as the sequelae of a prior infectious or inflammatory process.  3. Remote lacunar infarct of the right cerebellum.  09/20/13 MRA/MRV HEAD 1. Moderate distal small vessel disease.  2. Mild narrowing of the distal left A1 segment.  3. No focal lesion to explain the patient's symptoms.  4. Normal MR venogram.  07/09/14 CSF studies - opening pressure 10 cm H2O, closing pressure 8 cm H2O; WBC 1, RBC 0, PROTEIN 57, GLUCOSE 64, GRAM STAIN/CULTURE NEGATIVE   ASSESSMENT AND PLAN  73 y.o. year old male here with progressive headache, pressure, blurred vision, tinnitus symptoms since 2007, in the setting of weight gain. Some migraine features also noted. Patient has been intolerant of topiramate and acetazolamide and gabapentin.  LP shows normal opening pressure and labs. Now with secondary depression and anxiety.   Ddx: tension HA vs chronic migraine variant vs analgesic overuse headache   PLAN: - follow up with psychiatry to treat depression/anxiety - try acupuncture, massage, relaxation techniques - try riboflavin or magnesium supplements - refer to headache clinic for second opinion   Return in about 6 months (around 06/12/2015), or if symptoms worsen or fail to improve.    Penni Bombard, MD 75/17/0017, 4:94 PM Certified in Neurology, Neurophysiology and  Neuroimaging  [facility] [address], [place], [place] [address] [phone]

## 2014-12-25 ENCOUNTER — Other Ambulatory Visit: Payer: Self-pay | Admitting: Sports Medicine

## 2014-12-25 DIAGNOSIS — M545 Low back pain, unspecified: Secondary | ICD-10-CM

## 2014-12-25 DIAGNOSIS — M25552 Pain in left hip: Secondary | ICD-10-CM

## 2014-12-31 ENCOUNTER — Telehealth: Payer: Self-pay | Admitting: Interventional Cardiology

## 2014-12-31 NOTE — Telephone Encounter (Signed)
Returned pt call. Pt sts that he did f/u with his pcp about the blood in his stool. Pt had a hemocult done with his pcp that was negative.pt sts that his bleeding was related to his hemorrhoid. He has not had any reoccurrences. Dr.Smith update. Pt given Dr.Smith's ok to resume Xarelto and stop aspirin. Pt verbalized understanding.

## 2014-12-31 NOTE — Telephone Encounter (Signed)
New message    Pt c/o medication issue:  1. Name of Medication: xarelto   2. How are you currently taking this medication (dosage and times per day)? discotinue   3. Are you having a reaction (difficulty breathing--STAT)? No   4. What is your medication issue? Would like to go back on xaretlo . More of comfort level .  Stool shows no blood.

## 2015-01-03 ENCOUNTER — Ambulatory Visit: Payer: Medicare Other | Admitting: Diagnostic Neuroimaging

## 2015-01-07 ENCOUNTER — Ambulatory Visit
Admission: RE | Admit: 2015-01-07 | Discharge: 2015-01-07 | Disposition: A | Discharge status: Home / Self Care | Payer: Medicare Other | Source: Ambulatory Visit | Attending: Sports Medicine | Admitting: Sports Medicine

## 2015-01-07 DIAGNOSIS — M545 Low back pain, unspecified: Secondary | ICD-10-CM

## 2015-01-07 DIAGNOSIS — M25552 Pain in left hip: Secondary | ICD-10-CM

## 2015-01-22 ENCOUNTER — Ambulatory Visit (INDEPENDENT_AMBULATORY_CARE_PROVIDER_SITE_OTHER): Payer: Medicare Other | Admitting: Interventional Cardiology

## 2015-01-22 ENCOUNTER — Encounter: Payer: Self-pay | Admitting: Interventional Cardiology

## 2015-01-22 VITALS — BP 132/70 | HR 57 | Ht 69.0 in | Wt 222.8 lb

## 2015-01-22 DIAGNOSIS — Z7901 Long term (current) use of anticoagulants: Secondary | ICD-10-CM

## 2015-01-22 DIAGNOSIS — I480 Paroxysmal atrial fibrillation: Secondary | ICD-10-CM

## 2015-01-22 DIAGNOSIS — I10 Essential (primary) hypertension: Secondary | ICD-10-CM

## 2015-01-22 DIAGNOSIS — Z79899 Other long term (current) drug therapy: Secondary | ICD-10-CM

## 2015-01-22 DIAGNOSIS — E785 Hyperlipidemia, unspecified: Secondary | ICD-10-CM

## 2015-01-22 NOTE — Progress Notes (Signed)
Patient ID: MOIZ RYANT, male   DOB: [birth year], 74 y.o.   MRN: [id number]    [address], [address] [place], [place]  [address] Phone: [phone] Fax:  [phone]  Date:  01/22/2015   ID:  MIKAELE STECHER, DOB March 07, 1941, MRN [id number]  PCP:  Reginia Naas, MD   ASSESSMENT:  1. Preoperative CV clearance 2. Paroxysmal atrial fibrillation 3. Amiodarone therapy 4. Essential hypertension 5. Chronic anticoagulation, Xarelto  PLAN:  1. Cleared for upcoming lumbar spine surgery. He must discontinue Xarelto 72 hours prior to surgery. 2. Increase amiodarone to 400 mg per day starting 72 hours prior to surgery. He will continue this increased dose for 7 days following surgery then revert to 200 mg per day 3. Resume Xarelto when safe 4. Clinical follow-up as needed or in 6 months, which ever occurs first   SUBJECTIVE: Joseph [name] is a 74 y.o. male who recently fell and injured his back. He now has let up. The injury occurred because of a fall and not because of syncope. Since recent cardioversion he has had no recurrence of atrial fibrillation. He has had no bleeding complications on Xarelto. He denies neurological complaints and will be due to embolic phenomena. He denies dyspnea and orthopnea. He does not have angina or any known obstructive coronary disease. He is planning to have lower/lumbar surgery performed by either Dr. Trenton Gammon or Dr. Arnoldo Morale of the neurosurgical group.   Wt Readings from Last 3 Encounters:  01/22/15 222 lb 12.8 oz (101.061 kg)  12/11/14 240 lb 6.4 oz (109.045 kg)  10/22/14 255 lb (115.667 kg)     Past Medical History  Diagnosis Date  . Depression   . Headache(784.0)   . GERD (gastroesophageal reflux disease)   . Allergy   . Hypertension   . Hyperlipidemia   . Thoracic aneurysm   . Colon polyp   . Atrial fibrillation   . Panic attacks   . Diverticulosis   . Atypical atrial flutter 07/13/2014    Diagnosis on EKG 07/13/14 :  Cardioversion August 2015  . Atrial fibrillation     Current Outpatient Prescriptions  Medication Sig Dispense Refill  . ALPRAZolam (XANAX) 0.5 MG tablet Take 0.5-1 mg by mouth 3 (three) times daily as needed for sleep.    Marland Kitchen amiodarone (PACERONE) 200 MG tablet Take 1 tablet (200 mg total) by mouth daily. 180 tablet 3  . bifidobacterium infantis (ALIGN) capsule Take 1 capsule by mouth daily. 14 capsule 0  . Brimonidine Tartrate (MIRVASO) 0.33 % GEL Apply 1 application topically daily.    . fluorouracil (EFUDEX) 5 % cream Apply 1 application topically as needed.  0  . HYDROcodone-acetaminophen (NORCO) 7.5-325 MG per tablet Take 1 tablet by mouth as needed. For pain  0  . metoprolol succinate (TOPROL-XL) 100 MG 24 hr tablet Take 1 tablet (100 mg total) by mouth daily.    Marland Kitchen olmesartan (BENICAR) 40 MG tablet Take 40 mg by mouth daily.    Marland Kitchen OVER THE COUNTER MEDICATION Apply 1 drop to eye daily as needed (for dry eyes). Equate brand of eye lubricant drops    . pantoprazole (PROTONIX) 40 MG tablet Take 40 mg by mouth 2 (two) times daily.     . rosuvastatin (CRESTOR) 10 MG tablet Take 10 mg by mouth once a week.    . sertraline (ZOLOFT) 100 MG tablet Take 100 mg by mouth daily.  0  . XARELTO 20 MG TABS tablet Take 20  mg by mouth daily.  3  . zolpidem (AMBIEN) 10 MG tablet Take 5 mg by mouth daily as needed for sleep. For sleep    . [DISCONTINUED] famotidine (PEPCID AC) 10 MG chewable tablet Chew 10 mg by mouth as needed.       No current facility-administered medications for this visit.    Allergies:   No Known Allergies  Social History:  The patient  reports that he has never smoked. He has never used smokeless tobacco. He reports that he does not drink alcohol or use illicit drugs.   ROS:  Please see the history of present illness.   Unable to walk without a walker. Unable to lift his left foot off the floor. Significant back discomfort when he stands. Denies orthopnea, PND, chest pain,  syncope, hemoptysis, blood in the urine or stool.   All other systems reviewed and negative.   OBJECTIVE: VS:  BP 132/70 mmHg  Pulse 57  Ht 5\' 9"  (1.753 m)  Wt 222 lb 12.8 oz (101.061 kg)  BMI 32.89 kg/m2 Well nourished, well developed, in no acute distress, obese and somewhat depressed-appearing HEENT: normal Neck: JVD flat. Carotid bruit absent  Cardiac:  normal S1, S2; RRR; no murmur Lungs:  clear to auscultation bilaterally, no wheezing, rhonchi or rales Abd: soft, nontender, no hepatomegaly Ext: Edema absent. Pulses 2+ Skin: warm and dry Neuro:  CNs 2-12 intact, no focal abnormalities noted  EKG:  Not repeated       Signed, Illene Labrador III, MD 01/22/2015 8:12 AM

## 2015-01-22 NOTE — Patient Instructions (Signed)
Your physician has recommended you make the following change in your medication:  1) HOLD Xarelto 72 hours prior to surgery 2) INCREASE Amiodarone to 400mg  72 hours prior to surgery   You have been cleared for surgery with Dr.Brooks  Your physician wants you to follow-up in: 6 months with Dr.Smith You will receive a reminder letter in the mail two months in advance. If you don't receive a letter, please call our office to schedule the follow-up appointment.

## 2015-01-24 ENCOUNTER — Other Ambulatory Visit: Payer: Self-pay | Admitting: Neurosurgery

## 2015-01-31 ENCOUNTER — Encounter (HOSPITAL_COMMUNITY)
Admission: RE | Admit: 2015-01-31 | Discharge: 2015-01-31 | Disposition: A | Discharge status: Home / Self Care | Payer: Medicare Other | Source: Ambulatory Visit | Attending: Neurosurgery | Admitting: Neurosurgery

## 2015-01-31 ENCOUNTER — Encounter (HOSPITAL_COMMUNITY): Payer: Self-pay

## 2015-01-31 DIAGNOSIS — M4800 Spinal stenosis, site unspecified: Secondary | ICD-10-CM | POA: Diagnosis not present

## 2015-01-31 DIAGNOSIS — I4891 Unspecified atrial fibrillation: Secondary | ICD-10-CM | POA: Diagnosis not present

## 2015-01-31 DIAGNOSIS — Z01812 Encounter for preprocedural laboratory examination: Secondary | ICD-10-CM | POA: Diagnosis present

## 2015-01-31 DIAGNOSIS — I10 Essential (primary) hypertension: Secondary | ICD-10-CM | POA: Insufficient documentation

## 2015-01-31 DIAGNOSIS — E785 Hyperlipidemia, unspecified: Secondary | ICD-10-CM | POA: Insufficient documentation

## 2015-01-31 DIAGNOSIS — Z7901 Long term (current) use of anticoagulants: Secondary | ICD-10-CM | POA: Diagnosis not present

## 2015-01-31 DIAGNOSIS — K219 Gastro-esophageal reflux disease without esophagitis: Secondary | ICD-10-CM | POA: Diagnosis not present

## 2015-01-31 DIAGNOSIS — I712 Thoracic aortic aneurysm, without rupture: Secondary | ICD-10-CM | POA: Insufficient documentation

## 2015-01-31 DIAGNOSIS — I484 Atypical atrial flutter: Secondary | ICD-10-CM | POA: Insufficient documentation

## 2015-01-31 DIAGNOSIS — Z79899 Other long term (current) drug therapy: Secondary | ICD-10-CM | POA: Insufficient documentation

## 2015-01-31 HISTORY — DX: Cardiac arrhythmia, unspecified: I49.9

## 2015-01-31 HISTORY — DX: Other specified postprocedural states: Z98.890

## 2015-01-31 HISTORY — DX: Other specified postprocedural states: R11.2

## 2015-01-31 LAB — BASIC METABOLIC PANEL
Anion gap: 6 (ref 5–15)
BUN: 8 mg/dL (ref 6–23)
CHLORIDE: 103 mmol/L (ref 96–112)
CO2: 29 mmol/L (ref 19–32)
Calcium: 9.3 mg/dL (ref 8.4–10.5)
Creatinine, Ser: 1.12 mg/dL (ref 0.50–1.35)
GFR calc Af Amer: 73 mL/min — ABNORMAL LOW (ref >90)
GFR, EST NON AFRICAN AMERICAN: 63 mL/min — AB (ref >90)
Glucose, Bld: 138 mg/dL — ABNORMAL HIGH (ref 70–99)
POTASSIUM: 3.6 mmol/L (ref 3.5–5.1)
SODIUM: 138 mmol/L (ref 135–145)

## 2015-01-31 LAB — CBC WITH DIFFERENTIAL/PLATELET
Basophils Absolute: 0 10*3/uL (ref 0.0–0.1)
Basophils Relative: 0 % (ref 0–1)
EOS ABS: 0 10*3/uL (ref 0.0–0.7)
Eosinophils Relative: 1 % (ref 0–5)
HEMATOCRIT: 44.2 % (ref 39.0–52.0)
Hemoglobin: 15.5 g/dL (ref 13.0–17.0)
Lymphocytes Relative: 12 % (ref 12–46)
Lymphs Abs: 0.7 10*3/uL (ref 0.7–4.0)
MCH: 31.4 pg (ref 26.0–34.0)
MCHC: 35.1 g/dL (ref 30.0–36.0)
MCV: 89.5 fL (ref 78.0–100.0)
MONO ABS: 0.5 10*3/uL (ref 0.1–1.0)
Monocytes Relative: 8 % (ref 3–12)
Neutro Abs: 4.4 10*3/uL (ref 1.7–7.7)
Neutrophils Relative %: 79 % — ABNORMAL HIGH (ref 43–77)
PLATELETS: 225 10*3/uL (ref 150–400)
RBC: 4.94 MIL/uL (ref 4.22–5.81)
RDW: 13.1 % (ref 11.5–15.5)
WBC: 5.6 10*3/uL (ref 4.0–10.5)

## 2015-01-31 LAB — SURGICAL PCR SCREEN
MRSA, PCR: NEGATIVE
Staphylococcus aureus: NEGATIVE

## 2015-01-31 NOTE — Pre-Procedure Instructions (Signed)
Joseph [name]  01/31/2015   Your procedure is scheduled on:  02/08/15  Report to [facility] Admitting at 545 AM.  Call this number if you have problems the morning of surgery: [phone]   Remember:   Do not eat food or drink liquids after midnight.   Take these medicines the morning of surgery with A SIP OF WATER: xanax,amiodarone,hydrocodone,metoprolol,protonix,zoloft   Do not wear jewelry, make-up or nail polish.  Do not wear lotions, powders, or perfumes. You may wear deodorant.  Do not shave 48 hours prior to surgery. Men may shave face and neck.  Do not bring valuables to the hospital.  [facility] is not responsible                  for any belongings or valuables.               Contacts, dentures or bridgework may not be worn into surgery.  Leave suitcase in the car. After surgery it may be brought to your room.  For patients admitted to the hospital, discharge time is determined by your                treatment team.               Patients discharged the day of surgery will not be allowed to drive  home.  Name and phone number of your driver: family  Special Instructions: Shower using CHG 2 nights before surgery and the night before surgery.  If you shower the day of surgery use CHG.  Use special wash - you have one bottle of CHG for all showers.  You should use approximately 1/3 of the bottle for each shower.   Please read over the following fact sheets that you were given: Pain Booklet, Coughing and Deep Breathing, MRSA Information and Surgical Site Infection Prevention

## 2015-02-01 NOTE — Progress Notes (Addendum)
Anesthesia Chart Review:  Pt is 74 year old male scheduled for B L2-3, L3-4, L4-5 laminectomy and foraminotomy on 02/08/2015 with Dr. Annette Stable.   Pt's cardiologist is Dr. Daneen Schick.   PMH includes: HTN, atrial fibrillation (last cardioverted 08/215), atypical atrial flutter, thoracic aneurysm, hyperlipidemia, panic attacks, GERD.   Medications include: xarelto, amiodarone, brimonidine gel, metoprolol, olmesartan. Pt to discontinue xarelto 72 hours prior to surgery per Dr. Tamala Julian (2/9).   Preoperative labs reviewed.    EKG 10/05/2014: Sinus bradycardia (59 bpm). L axis deviation. Cannot rule out anterior infarct, age undetermined. Dr. Tamala Julian interpreted EKG as sinus bradycardia, otherwise normal.   MR angiogram chest 02/28/2014: Stable aneurysmal dilatation of the proximal ascending thoracic aorta measuring 4.7- 4.8 cm in greatest measured diameter.  Cardiac cath 12/13/2008: 1. Normal coronary arteries. 2. Normal left ventricular function. 3. Enlarged aortic root based upon catheter movement within the aorta and the size of catheter needed to cannulate the left coronary.   Pt has cardiac clearance in Epic note by Dr. Tamala Julian dated 01/22/2015.   If no changes, I anticipate pt can proceed with surgery as scheduled.   Willeen Cass, FNP-BC [facility]/Anesthesiology Phone: [phone] 02/01/2015 2:06 PM

## 2015-02-05 ENCOUNTER — Other Ambulatory Visit: Payer: Self-pay | Admitting: *Deleted

## 2015-02-05 DIAGNOSIS — I712 Thoracic aortic aneurysm, without rupture, unspecified: Secondary | ICD-10-CM

## 2015-02-07 MED ORDER — CEFAZOLIN SODIUM-DEXTROSE 2-3 GM-% IV SOLR
2.0000 g | INTRAVENOUS | Status: AC
  Administered 2015-02-08: 2 g via INTRAVENOUS
  Filled 2015-02-07: qty 50

## 2015-02-07 MED ORDER — DEXAMETHASONE SODIUM PHOSPHATE 10 MG/ML IJ SOLN
10.0000 mg | INTRAMUSCULAR | Status: AC
  Administered 2015-02-08: 10 mg via INTRAVENOUS
  Filled 2015-02-07: qty 1

## 2015-02-08 ENCOUNTER — Inpatient Hospital Stay (HOSPITAL_COMMUNITY): Payer: Medicare Other | Admitting: Emergency Medicine

## 2015-02-08 ENCOUNTER — Encounter (HOSPITAL_COMMUNITY)
Admission: RE | Disposition: A | Discharge status: Home / Self Care | Payer: Self-pay | Source: Ambulatory Visit | Attending: Neurosurgery

## 2015-02-08 ENCOUNTER — Observation Stay (HOSPITAL_COMMUNITY)
Admission: RE | Admit: 2015-02-08 | Discharge: 2015-02-09 | Disposition: A | Discharge status: Home / Self Care | Payer: Medicare Other | Source: Ambulatory Visit | Attending: Neurosurgery | Admitting: Neurosurgery

## 2015-02-08 ENCOUNTER — Inpatient Hospital Stay (HOSPITAL_COMMUNITY): Payer: Medicare Other | Admitting: Certified Registered"

## 2015-02-08 ENCOUNTER — Inpatient Hospital Stay (HOSPITAL_COMMUNITY): Payer: Medicare Other

## 2015-02-08 ENCOUNTER — Encounter (HOSPITAL_COMMUNITY): Payer: Self-pay

## 2015-02-08 DIAGNOSIS — M4806 Spinal stenosis, lumbar region: Principal | ICD-10-CM | POA: Insufficient documentation

## 2015-02-08 DIAGNOSIS — K219 Gastro-esophageal reflux disease without esophagitis: Secondary | ICD-10-CM | POA: Diagnosis not present

## 2015-02-08 DIAGNOSIS — F419 Anxiety disorder, unspecified: Secondary | ICD-10-CM | POA: Insufficient documentation

## 2015-02-08 DIAGNOSIS — I739 Peripheral vascular disease, unspecified: Secondary | ICD-10-CM | POA: Insufficient documentation

## 2015-02-08 DIAGNOSIS — I4891 Unspecified atrial fibrillation: Secondary | ICD-10-CM | POA: Insufficient documentation

## 2015-02-08 DIAGNOSIS — I10 Essential (primary) hypertension: Secondary | ICD-10-CM | POA: Diagnosis not present

## 2015-02-08 DIAGNOSIS — M7138 Other bursal cyst, other site: Secondary | ICD-10-CM | POA: Diagnosis not present

## 2015-02-08 DIAGNOSIS — M48062 Spinal stenosis, lumbar region with neurogenic claudication: Secondary | ICD-10-CM

## 2015-02-08 DIAGNOSIS — Z7901 Long term (current) use of anticoagulants: Secondary | ICD-10-CM | POA: Insufficient documentation

## 2015-02-08 DIAGNOSIS — Z79899 Other long term (current) drug therapy: Secondary | ICD-10-CM | POA: Insufficient documentation

## 2015-02-08 DIAGNOSIS — F329 Major depressive disorder, single episode, unspecified: Secondary | ICD-10-CM | POA: Insufficient documentation

## 2015-02-08 DIAGNOSIS — Z419 Encounter for procedure for purposes other than remedying health state, unspecified: Secondary | ICD-10-CM

## 2015-02-08 HISTORY — PX: LUMBAR LAMINECTOMY/DECOMPRESSION MICRODISCECTOMY: SHX5026

## 2015-02-08 SURGERY — LUMBAR LAMINECTOMY/DECOMPRESSION MICRODISCECTOMY 3 LEVELS
Anesthesia: General | Site: Spine Lumbar | Laterality: Bilateral

## 2015-02-08 MED ORDER — ONDANSETRON HCL 4 MG/2ML IJ SOLN
INTRAMUSCULAR | Status: DC | PRN
  Administered 2015-02-08: 4 mg via INTRAVENOUS

## 2015-02-08 MED ORDER — ONDANSETRON HCL 4 MG/2ML IJ SOLN: 4.0000 mg | INTRAMUSCULAR | Status: DC | PRN

## 2015-02-08 MED ORDER — HYDROCODONE-ACETAMINOPHEN 5-325 MG PO TABS
1.0000 | ORAL_TABLET | ORAL | Status: DC | PRN
  Administered 2015-02-08: 2 via ORAL
  Administered 2015-02-09: 1 via ORAL
  Filled 2015-02-08 (×2): qty 2

## 2015-02-08 MED ORDER — LIDOCAINE HCL (CARDIAC) 20 MG/ML IV SOLN
INTRAVENOUS | Status: DC | PRN
  Administered 2015-02-08: 60 mg via INTRAVENOUS

## 2015-02-08 MED ORDER — KETOROLAC TROMETHAMINE 30 MG/ML IJ SOLN
INTRAMUSCULAR | Status: DC | PRN
  Administered 2015-02-08: 30 mg via INTRAVENOUS

## 2015-02-08 MED ORDER — SODIUM CHLORIDE 0.9 % IJ SOLN
INTRAMUSCULAR | Status: AC
  Filled 2015-02-08: qty 10

## 2015-02-08 MED ORDER — EPHEDRINE SULFATE 50 MG/ML IJ SOLN
INTRAMUSCULAR | Status: AC
  Filled 2015-02-08: qty 1

## 2015-02-08 MED ORDER — ARTIFICIAL TEARS OP OINT
TOPICAL_OINTMENT | OPHTHALMIC | Status: AC
  Filled 2015-02-08: qty 3.5

## 2015-02-08 MED ORDER — OXYCODONE-ACETAMINOPHEN 5-325 MG PO TABS: 1.0000 | ORAL_TABLET | ORAL | Status: DC | PRN

## 2015-02-08 MED ORDER — MIDAZOLAM HCL 5 MG/5ML IJ SOLN
INTRAMUSCULAR | Status: DC | PRN
  Administered 2015-02-08 (×2): 1 mg via INTRAVENOUS

## 2015-02-08 MED ORDER — HYDROMORPHONE HCL 1 MG/ML IJ SOLN
INTRAMUSCULAR | Status: AC
  Filled 2015-02-08: qty 1

## 2015-02-08 MED ORDER — HYDROMORPHONE HCL 1 MG/ML IJ SOLN: 0.5000 mg | INTRAMUSCULAR | Status: DC | PRN

## 2015-02-08 MED ORDER — ROCURONIUM BROMIDE 100 MG/10ML IV SOLN
INTRAVENOUS | Status: DC | PRN
  Administered 2015-02-08 (×3): 10 mg via INTRAVENOUS
  Administered 2015-02-08: 40 mg via INTRAVENOUS

## 2015-02-08 MED ORDER — ROCURONIUM BROMIDE 50 MG/5ML IV SOLN
INTRAVENOUS | Status: AC
  Filled 2015-02-08: qty 1

## 2015-02-08 MED ORDER — HEMOSTATIC AGENTS (NO CHARGE) OPTIME
TOPICAL | Status: DC | PRN
  Administered 2015-02-08: 1 via TOPICAL

## 2015-02-08 MED ORDER — BUPIVACAINE HCL (PF) 0.25 % IJ SOLN
INTRAMUSCULAR | Status: DC | PRN
  Administered 2015-02-08: 30 mL

## 2015-02-08 MED ORDER — GLYCOPYRROLATE 0.2 MG/ML IJ SOLN
INTRAMUSCULAR | Status: DC | PRN
  Administered 2015-02-08: 0.2 mg via INTRAVENOUS
  Administered 2015-02-08: 0.6 mg via INTRAVENOUS
  Administered 2015-02-08: 0.2 mg via INTRAVENOUS

## 2015-02-08 MED ORDER — MIDAZOLAM HCL 2 MG/2ML IJ SOLN
INTRAMUSCULAR | Status: AC
  Filled 2015-02-08: qty 2

## 2015-02-08 MED ORDER — PROPOFOL 10 MG/ML IV BOLUS
INTRAVENOUS | Status: DC | PRN
  Administered 2015-02-08: 200 mg via INTRAVENOUS

## 2015-02-08 MED ORDER — ALPRAZOLAM 0.5 MG PO TABS: 0.5000 mg | ORAL_TABLET | Freq: Three times a day (TID) | ORAL | Status: DC | PRN

## 2015-02-08 MED ORDER — SERTRALINE HCL 100 MG PO TABS
100.0000 mg | ORAL_TABLET | Freq: Every day | ORAL | Status: DC
  Administered 2015-02-09: 100 mg via ORAL
  Filled 2015-02-08: qty 1

## 2015-02-08 MED ORDER — ACETAMINOPHEN 650 MG RE SUPP: 650.0000 mg | RECTAL | Status: DC | PRN

## 2015-02-08 MED ORDER — SODIUM CHLORIDE 0.9 % IR SOLN
Status: DC | PRN
  Administered 2015-02-08: 500 mL

## 2015-02-08 MED ORDER — PANTOPRAZOLE SODIUM 40 MG PO TBEC
40.0000 mg | DELAYED_RELEASE_TABLET | Freq: Two times a day (BID) | ORAL | Status: DC
  Administered 2015-02-08 – 2015-02-09 (×2): 40 mg via ORAL
  Filled 2015-02-08 (×2): qty 1

## 2015-02-08 MED ORDER — BRIMONIDINE TARTRATE 0.33 % EX GEL: 1.0000 "application " | Freq: Every day | CUTANEOUS | Status: DC

## 2015-02-08 MED ORDER — SODIUM CHLORIDE 0.9 % IJ SOLN
3.0000 mL | Freq: Two times a day (BID) | INTRAMUSCULAR | Status: DC
  Administered 2015-02-08: 3 mL via INTRAVENOUS

## 2015-02-08 MED ORDER — PROPOFOL 10 MG/ML IV BOLUS
INTRAVENOUS | Status: AC
  Filled 2015-02-08: qty 20

## 2015-02-08 MED ORDER — 0.9 % SODIUM CHLORIDE (POUR BTL) OPTIME
TOPICAL | Status: DC | PRN
  Administered 2015-02-08: 1000 mL

## 2015-02-08 MED ORDER — MENTHOL 3 MG MT LOZG
1.0000 | LOZENGE | OROMUCOSAL | Status: DC | PRN
  Administered 2015-02-08: 3 mg via ORAL
  Filled 2015-02-08: qty 9

## 2015-02-08 MED ORDER — CYCLOBENZAPRINE HCL 10 MG PO TABS
10.0000 mg | ORAL_TABLET | Freq: Three times a day (TID) | ORAL | Status: DC | PRN
  Administered 2015-02-08: 10 mg via ORAL

## 2015-02-08 MED ORDER — ONDANSETRON HCL 4 MG/2ML IJ SOLN
INTRAMUSCULAR | Status: AC
  Filled 2015-02-08: qty 2

## 2015-02-08 MED ORDER — FENTANYL CITRATE 0.05 MG/ML IJ SOLN
INTRAMUSCULAR | Status: DC | PRN
  Administered 2015-02-08: 100 ug via INTRAVENOUS
  Administered 2015-02-08 (×2): 50 ug via INTRAVENOUS
  Administered 2015-02-08 (×2): 25 ug via INTRAVENOUS

## 2015-02-08 MED ORDER — GLYCOPYRROLATE 0.2 MG/ML IJ SOLN
INTRAMUSCULAR | Status: AC
  Filled 2015-02-08: qty 2

## 2015-02-08 MED ORDER — FENTANYL CITRATE 0.05 MG/ML IJ SOLN
INTRAMUSCULAR | Status: AC
  Filled 2015-02-08: qty 5

## 2015-02-08 MED ORDER — DOCUSATE SODIUM 100 MG PO CAPS
100.0000 mg | ORAL_CAPSULE | Freq: Two times a day (BID) | ORAL | Status: DC
  Administered 2015-02-08 – 2015-02-09 (×2): 100 mg via ORAL
  Filled 2015-02-08 (×3): qty 1

## 2015-02-08 MED ORDER — SODIUM CHLORIDE 0.9 % IJ SOLN: 3.0000 mL | INTRAMUSCULAR | Status: DC | PRN

## 2015-02-08 MED ORDER — NEOSTIGMINE METHYLSULFATE 10 MG/10ML IV SOLN
INTRAVENOUS | Status: DC | PRN
  Administered 2015-02-08: 4 mg via INTRAVENOUS

## 2015-02-08 MED ORDER — KETOROLAC TROMETHAMINE 30 MG/ML IJ SOLN
30.0000 mg | Freq: Four times a day (QID) | INTRAMUSCULAR | Status: DC
  Administered 2015-02-08 – 2015-02-09 (×3): 30 mg via INTRAVENOUS
  Filled 2015-02-08 (×3): qty 1

## 2015-02-08 MED ORDER — PROMETHAZINE HCL 25 MG/ML IJ SOLN: 6.2500 mg | INTRAMUSCULAR | Status: DC | PRN

## 2015-02-08 MED ORDER — ARTIFICIAL TEARS OP OINT
TOPICAL_OINTMENT | OPHTHALMIC | Status: DC | PRN
  Administered 2015-02-08: 1 via OPHTHALMIC

## 2015-02-08 MED ORDER — CYCLOBENZAPRINE HCL 10 MG PO TABS
ORAL_TABLET | ORAL | Status: AC
  Filled 2015-02-08: qty 1

## 2015-02-08 MED ORDER — ACETAMINOPHEN 325 MG PO TABS: 650.0000 mg | ORAL_TABLET | ORAL | Status: DC | PRN

## 2015-02-08 MED ORDER — GLYCOPYRROLATE 0.2 MG/ML IJ SOLN
INTRAMUSCULAR | Status: AC
  Filled 2015-02-08: qty 3

## 2015-02-08 MED ORDER — LACTATED RINGERS IV SOLN
INTRAVENOUS | Status: DC | PRN
  Administered 2015-02-08 (×2): via INTRAVENOUS

## 2015-02-08 MED ORDER — METOPROLOL SUCCINATE ER 100 MG PO TB24
100.0000 mg | ORAL_TABLET | Freq: Every day | ORAL | Status: DC
  Administered 2015-02-09: 100 mg via ORAL
  Filled 2015-02-08: qty 1

## 2015-02-08 MED ORDER — HYDROMORPHONE HCL 1 MG/ML IJ SOLN
0.2500 mg | INTRAMUSCULAR | Status: DC | PRN
  Administered 2015-02-08 (×3): 0.5 mg via INTRAVENOUS

## 2015-02-08 MED ORDER — THROMBIN 5000 UNITS EX SOLR
CUTANEOUS | Status: DC | PRN
  Administered 2015-02-08 (×2): 5000 [IU] via TOPICAL

## 2015-02-08 MED ORDER — HYDROMORPHONE HCL 1 MG/ML IJ SOLN
INTRAMUSCULAR | Status: AC
  Administered 2015-02-08: 0.5 mg via INTRAVENOUS
  Filled 2015-02-08: qty 1

## 2015-02-08 MED ORDER — LIDOCAINE HCL (CARDIAC) 20 MG/ML IV SOLN
INTRAVENOUS | Status: AC
  Filled 2015-02-08: qty 5

## 2015-02-08 MED ORDER — SODIUM CHLORIDE 0.9 % IV SOLN
250.0000 mL | INTRAVENOUS | Status: DC
Start: 2015-02-08 — End: 2015-02-09
  Administered 2015-02-08: 250 mL via INTRAVENOUS

## 2015-02-08 MED ORDER — ROSUVASTATIN CALCIUM 10 MG PO TABS
10.0000 mg | ORAL_TABLET | ORAL | Status: DC
  Administered 2015-02-08: 10 mg via ORAL
  Filled 2015-02-08: qty 1

## 2015-02-08 MED ORDER — SCOPOLAMINE 1 MG/3DAYS TD PT72
MEDICATED_PATCH | TRANSDERMAL | Status: AC
Start: 2015-02-08 — End: 2015-02-08
  Administered 2015-02-08: 1 via TRANSDERMAL
  Filled 2015-02-08: qty 1

## 2015-02-08 MED ORDER — EPHEDRINE SULFATE 50 MG/ML IJ SOLN
INTRAMUSCULAR | Status: DC | PRN
  Administered 2015-02-08 (×6): 5 mg via INTRAVENOUS
  Administered 2015-02-08: 10 mg via INTRAVENOUS
  Administered 2015-02-08: 5 mg via INTRAVENOUS

## 2015-02-08 MED ORDER — CEFAZOLIN SODIUM 1-5 GM-% IV SOLN
1.0000 g | Freq: Three times a day (TID) | INTRAVENOUS | Status: AC
  Administered 2015-02-08 (×2): 1 g via INTRAVENOUS
  Filled 2015-02-08 (×2): qty 50

## 2015-02-08 MED ORDER — AMIODARONE HCL 200 MG PO TABS
200.0000 mg | ORAL_TABLET | Freq: Every day | ORAL | Status: DC
  Administered 2015-02-09: 200 mg via ORAL
  Filled 2015-02-08: qty 1

## 2015-02-08 MED ORDER — IRBESARTAN 300 MG PO TABS
300.0000 mg | ORAL_TABLET | Freq: Every day | ORAL | Status: DC
  Administered 2015-02-09: 300 mg via ORAL
  Filled 2015-02-08: qty 1

## 2015-02-08 MED ORDER — PHENOL 1.4 % MT LIQD: 1.0000 | OROMUCOSAL | Status: DC | PRN

## 2015-02-08 SURGICAL SUPPLY — 51 items
BAG DECANTER FOR FLEXI CONT (MISCELLANEOUS) ×2 IMPLANT
BENZOIN TINCTURE PRP APPL 2/3 (GAUZE/BANDAGES/DRESSINGS) ×2 IMPLANT
BLADE CLIPPER SURG (BLADE) ×2 IMPLANT
BRUSH SCRUB EZ PLAIN DRY (MISCELLANEOUS) ×2 IMPLANT
BUR CUTTER 7.0 ROUND (BURR) ×2 IMPLANT
CANISTER SUCT 3000ML PPV (MISCELLANEOUS) ×2 IMPLANT
CONT SPEC 4OZ CLIKSEAL STRL BL (MISCELLANEOUS) ×2 IMPLANT
DECANTER SPIKE VIAL GLASS SM (MISCELLANEOUS) IMPLANT
DRAPE LAPAROTOMY 100X72X124 (DRAPES) ×2 IMPLANT
DRAPE MICROSCOPE LEICA (MISCELLANEOUS) IMPLANT
DRAPE POUCH INSTRU U-SHP 10X18 (DRAPES) ×2 IMPLANT
DRAPE PROXIMA HALF (DRAPES) ×2 IMPLANT
DRAPE SURG 17X23 STRL (DRAPES) ×4 IMPLANT
DRSG OPSITE POSTOP 4X6 (GAUZE/BANDAGES/DRESSINGS) ×2 IMPLANT
DURAPREP 26ML APPLICATOR (WOUND CARE) ×2 IMPLANT
ELECT REM PT RETURN 9FT ADLT (ELECTROSURGICAL) ×2
ELECTRODE REM PT RTRN 9FT ADLT (ELECTROSURGICAL) ×1 IMPLANT
GAUZE SPONGE 4X4 12PLY STRL (GAUZE/BANDAGES/DRESSINGS) IMPLANT
GAUZE SPONGE 4X4 16PLY XRAY LF (GAUZE/BANDAGES/DRESSINGS) IMPLANT
GLOVE BIO SURGEON STRL SZ7 (GLOVE) ×2 IMPLANT
GLOVE BIO SURGEON STRL SZ8 (GLOVE) ×2 IMPLANT
GLOVE BIOGEL PI IND STRL 7.5 (GLOVE) ×3 IMPLANT
GLOVE BIOGEL PI INDICATOR 7.5 (GLOVE) ×3
GLOVE ECLIPSE 7.5 STRL STRAW (GLOVE) ×8 IMPLANT
GLOVE ECLIPSE 9.0 STRL (GLOVE) ×2 IMPLANT
GLOVE EXAM NITRILE LRG STRL (GLOVE) IMPLANT
GLOVE EXAM NITRILE MD LF STRL (GLOVE) IMPLANT
GLOVE EXAM NITRILE XL STR (GLOVE) IMPLANT
GLOVE EXAM NITRILE XS STR PU (GLOVE) IMPLANT
GOWN STRL REUS W/ TWL LRG LVL3 (GOWN DISPOSABLE) IMPLANT
GOWN STRL REUS W/ TWL XL LVL3 (GOWN DISPOSABLE) ×6 IMPLANT
GOWN STRL REUS W/TWL 2XL LVL3 (GOWN DISPOSABLE) IMPLANT
GOWN STRL REUS W/TWL LRG LVL3 (GOWN DISPOSABLE)
GOWN STRL REUS W/TWL XL LVL3 (GOWN DISPOSABLE) ×6
KIT BASIN OR (CUSTOM PROCEDURE TRAY) ×2 IMPLANT
KIT ROOM TURNOVER OR (KITS) ×2 IMPLANT
LIQUID BAND (GAUZE/BANDAGES/DRESSINGS) ×2 IMPLANT
NEEDLE HYPO 22GX1.5 SAFETY (NEEDLE) ×2 IMPLANT
NEEDLE SPNL 22GX3.5 QUINCKE BK (NEEDLE) ×2 IMPLANT
NS IRRIG 1000ML POUR BTL (IV SOLUTION) ×2 IMPLANT
PACK LAMINECTOMY NEURO (CUSTOM PROCEDURE TRAY) ×2 IMPLANT
PAD ARMBOARD 7.5X6 YLW CONV (MISCELLANEOUS) ×10 IMPLANT
RUBBERBAND STERILE (MISCELLANEOUS) IMPLANT
SPONGE SURGIFOAM ABS GEL SZ50 (HEMOSTASIS) ×2 IMPLANT
STRIP CLOSURE SKIN 1/2X4 (GAUZE/BANDAGES/DRESSINGS) ×2 IMPLANT
SUT VIC AB 2-0 CT1 18 (SUTURE) ×4 IMPLANT
SUT VIC AB 3-0 SH 8-18 (SUTURE) ×2 IMPLANT
SYR 20ML ECCENTRIC (SYRINGE) ×2 IMPLANT
TOWEL OR 17X24 6PK STRL BLUE (TOWEL DISPOSABLE) ×2 IMPLANT
TOWEL OR 17X26 10 PK STRL BLUE (TOWEL DISPOSABLE) ×2 IMPLANT
WATER STERILE IRR 1000ML POUR (IV SOLUTION) ×2 IMPLANT

## 2015-02-08 NOTE — H&P (Signed)
Joseph [name] is an 74 y.o. male.   Chief Complaint: Bilateral lower extremity pain HPI: 74 year old male with progressive bilateral lower extremity pain, paresthesias and weakness left greater than right. Symptoms aggravated by standing or walking. Minimal back pain. Workup demonstrates evidence of marked stenosis at L2-3, L3-4 and on the left at L4-5. Patient presents now for decompressive surgery in hopes of improving his symptoms.  Past Medical History  Diagnosis Date  . Depression   . Headache(784.0)   . GERD (gastroesophageal reflux disease)   . Allergy   . Hypertension   . Hyperlipidemia   . Thoracic aneurysm   . Colon polyp   . Atrial fibrillation   . Panic attacks   . Diverticulosis   . Atypical atrial flutter 07/13/2014    Diagnosis on EKG 07/13/14 : Cardioversion August 2015  . Atrial fibrillation   . Dysrhythmia   . PONV (postoperative nausea and vomiting)     Past Surgical History  Procedure Laterality Date  . Appendectomy  1958  . Cardiac catheterization  2009  . Inguinal hernia repair      right  . Vasectomy    . Cardioversion  03/18/2012    Procedure: CARDIOVERSION;  Surgeon: [name], MD;  Location: [facility];  Service: Cardiovascular;  Laterality: N/A;  . Ablataion  07/06/2012  . Cardioversion N/A 08/23/2014    Procedure: CARDIOVERSION;  Surgeon: [name], MD;  Location: [facility] ENDOSCOPY;  Service: Cardiovascular;  Laterality: N/A;  . Spinal tap      Family History  Problem Relation Age of Onset  . Heart disease Mother 73    mother  . Heart attack Mother   . Lung cancer Father   . Heart disease Father   . Kidney disease Brother 39    died age 6 heart attack  . Heart attack Brother   . Heart disease Maternal Grandfather    Social History:  reports that he has never smoked. He has never used smokeless tobacco. He reports that he does not drink alcohol or use illicit drugs.  Allergies: No Known Allergies  Medications Prior to Admission   Medication Sig Dispense Refill  . acetaminophen (TYLENOL) 500 MG tablet Take 500 mg by mouth every 6 (six) hours as needed (pain).    Marland Kitchen ALPRAZolam (XANAX) 0.5 MG tablet Take 0.5-1 mg by mouth 3 (three) times daily as needed for anxiety or sleep.     Marland Kitchen amiodarone (PACERONE) 200 MG tablet Take 1 tablet (200 mg total) by mouth daily. 180 tablet 3  . bifidobacterium infantis (ALIGN) capsule Take 1 capsule by mouth daily. 14 capsule 0  . Brimonidine Tartrate (MIRVASO) 0.33 % GEL Apply 1 application topically daily.    . cholecalciferol (VITAMIN D) 1000 UNITS tablet Take 1,000 Units by mouth daily.    Marland Kitchen HYDROcodone-acetaminophen (NORCO) 7.5-325 MG per tablet Take 1 tablet by mouth every 6 (six) hours as needed (pain).   0  . Magnesium 250 MG TABS Take 250 mg by mouth daily.    . metoprolol succinate (TOPROL-XL) 100 MG 24 hr tablet Take 1 tablet (100 mg total) by mouth daily.    Marland Kitchen olmesartan (BENICAR) 40 MG tablet Take 40 mg by mouth daily.    Marland Kitchen OVER THE COUNTER MEDICATION Apply 1 drop to eye daily as needed (for dry eyes). Equate brand of eye lubricant drops    . pantoprazole (PROTONIX) 40 MG tablet Take 40 mg by mouth 2 (two) times daily.     Marland Kitchen  riboflavin (VITAMIN B-2) 100 MG TABS tablet Take 100 mg by mouth daily.    . rosuvastatin (CRESTOR) 10 MG tablet Take 10 mg by mouth 3 (three) times a week.     . sertraline (ZOLOFT) 100 MG tablet Take 100 mg by mouth daily.  0  . XARELTO 20 MG TABS tablet Take 20 mg by mouth daily.  3  . zolpidem (AMBIEN) 10 MG tablet Take 10 mg by mouth at bedtime. For sleep      No results found for this or any previous visit (from the past 48 hour(s)). No results found.  Review of Systems  Constitutional: Negative.   HENT: Negative.   Eyes: Negative.   Respiratory: Negative.   Cardiovascular: Positive for palpitations.  Gastrointestinal: Negative.   Genitourinary: Negative.   Musculoskeletal: Negative.   Skin: Negative.   Neurological: Negative.    Endo/Heme/Allergies: Negative.   Psychiatric/Behavioral: Negative.     Blood pressure 136/77, pulse 50, temperature 97.7 F (36.5 C), temperature source Oral, resp. rate 20, weight 99.338 kg (219 lb), SpO2 99 %. Physical Exam  Constitutional: He is oriented to person, place, and time. He appears well-developed and well-nourished. No distress.  HENT:  Head: Normocephalic and atraumatic.  Right Ear: External ear normal.  Left Ear: External ear normal.  Nose: Nose normal.  Mouth/Throat: Oropharynx is clear and moist.  Eyes: Conjunctivae and EOM are normal. Pupils are equal, round, and reactive to light. Right eye exhibits no discharge. Left eye exhibits no discharge.  Neck: Normal range of motion. Neck supple. No JVD present. No tracheal deviation present. No thyromegaly present.  Cardiovascular: Normal heart sounds and intact distal pulses.  Exam reveals no friction rub.   No murmur heard. Respiratory: Effort normal and breath sounds normal. No stridor. No respiratory distress. He has no wheezes.  GI: Bowel sounds are normal. He exhibits no distension. There is no tenderness.  Musculoskeletal: Normal range of motion. He exhibits no edema or tenderness.  Neurological: He is alert and oriented to person, place, and time. He has normal reflexes. He displays normal reflexes. No cranial nerve deficit. He exhibits normal muscle tone. Coordination normal.  Skin: Skin is warm and dry. No rash noted. He is not diaphoretic. No erythema. No pallor.  Psychiatric: He has a normal mood and affect. His behavior is normal. Judgment and thought content normal.     Assessment/Plan Lumbar stenosis with neurogenic claudication. Plan bilateral L2-3, L3-4 decompressive laminotomies and foraminotomies and left L4-5 decompressive laminotomy and foraminotomy. Risks and benefits of been explained. Patient wishes to proceed.  Enda Santo A 02/08/2015, 7:35 AM

## 2015-02-08 NOTE — Brief Op Note (Signed)
02/08/2015  10:19 AM  PATIENT:  [name]  74 y.o. male  PRE-OPERATIVE DIAGNOSIS:  stenosis  POST-OPERATIVE DIAGNOSIS:  stenosis  PROCEDURE:  Procedure(s): Laminectomy and Foraminotomy - bilateral - Lumbar two-three, three-four, left lumbar four-five with resection of synovial cyst  (Bilateral)  SURGEON:  Surgeon(s) and Role:    * Charlie Pitter, MD - Primary    * Eustace Moore, MD - Assisting  PHYSICIAN ASSISTANT:   ASSISTANTS:    ANESTHESIA:   general  EBL:  Total I/O In: 1000 [I.V.:1000] Out: 300 [Blood:300]  BLOOD ADMINISTERED:none  DRAINS: none   LOCAL MEDICATIONS USED:  MARCAINE     SPECIMEN:  No Specimen  DISPOSITION OF SPECIMEN:  N/A  COUNTS:  YES  TOURNIQUET:  * No tourniquets in log *  DICTATION: .Dragon Dictation  PLAN OF CARE: Admit for overnight observation  PATIENT DISPOSITION:  PACU - hemodynamically stable.   Delay start of Pharmacological VTE agent (>24hrs) due to surgical blood loss or risk of bleeding: yes

## 2015-02-08 NOTE — Transfer of Care (Signed)
Immediate Anesthesia Transfer of Care Note  Patient: Joseph [name]  Procedure(s) Performed: Procedure(s): Laminectomy and Foraminotomy - bilateral - Lumbar two-three, three-four, left lumbar four-five with resection of synovial cyst  (Bilateral)  Patient Location: PACU  Anesthesia Type:General  Level of Consciousness: awake and sedated  Airway & Oxygen Therapy: Patient Spontanous Breathing and Patient connected to nasal cannula oxygen  Post-op Assessment: Report given to RN, Post -op Vital signs reviewed and stable and Patient moving all extremities X 4  Post vital signs: Reviewed and stable  Last Vitals:  Filed Vitals:   02/08/15 0622  BP: 136/77  Pulse: 50  Temp: 36.5 C  Resp: 20    Complications: No apparent anesthesia complications

## 2015-02-08 NOTE — Anesthesia Preprocedure Evaluation (Addendum)
Anesthesia Evaluation  Patient identified by MRN, date of birth, ID band Patient awake    Reviewed: Allergy & Precautions, NPO status , Patient's Chart, lab work & pertinent test results  History of Anesthesia Complications (+) PONV  Airway Mallampati: II  TM Distance: >3 FB Neck ROM: Full    Dental  (+) Teeth Intact, Dental Advisory Given   Pulmonary neg pulmonary ROS,  breath sounds clear to auscultation        Cardiovascular hypertension, + Peripheral Vascular Disease + dysrhythmias Atrial Fibrillation Rhythm:Regular Rate:Normal     Neuro/Psych  Headaches, Anxiety Depression    GI/Hepatic Neg liver ROS, GERD-  ,  Endo/Other  negative endocrine ROS  Renal/GU negative Renal ROS     Musculoskeletal   Abdominal   Peds  Hematology   Anesthesia Other Findings   Reproductive/Obstetrics                            Anesthesia Physical Anesthesia Plan  ASA: III  Anesthesia Plan: General   Post-op Pain Management:    Induction: Intravenous  Airway Management Planned: Oral ETT  Additional Equipment:   Intra-op Plan:   Post-operative Plan: Possible Post-op intubation/ventilation  Informed Consent: I have reviewed the patients History and Physical, chart, labs and discussed the procedure including the risks, benefits and alternatives for the proposed anesthesia with the patient or authorized representative who has indicated his/her understanding and acceptance.   Dental advisory given  Plan Discussed with: CRNA and Anesthesiologist  Anesthesia Plan Comments:         Anesthesia Quick Evaluation

## 2015-02-08 NOTE — Anesthesia Procedure Notes (Signed)
Procedure Name: Intubation Date/Time: 02/08/2015 7:47 AM Performed by: Gaylene Brooks Pre-anesthesia Checklist: Emergency Drugs available, Patient identified, Timeout performed, Suction available and Patient being monitored Patient Re-evaluated:Patient Re-evaluated prior to inductionOxygen Delivery Method: Circle system utilized Preoxygenation: Pre-oxygenation with 100% oxygen Intubation Type: IV induction Ventilation: Mask ventilation without difficulty Laryngoscope Size: Miller and 2 Grade View: Grade II Tube type: Oral Tube size: 7.5 mm Number of attempts: 1 Airway Equipment and Method: Stylet Placement Confirmation: ETT inserted through vocal cords under direct vision,  breath sounds checked- equal and bilateral,  positive ETCO2 and CO2 detector Secured at: 23 cm Tube secured with: Tape Dental Injury: Teeth and Oropharynx as per pre-operative assessment

## 2015-02-08 NOTE — Op Note (Signed)
Date of procedure: 02/08/2015  Date of dictation: Same  Service: Neurosurgery  Preoperative diagnosis: L2-3, L3-4, L4-5 spondylosis with stenosis; right L3-4 adherent synovial cyst  Postoperative diagnosis: Same  Procedure Name: Right L3-4 laminotomy and resection of adherent synovial cyst, microdissection  Bilateral L2-3 decompressive laminotomies and foraminotomies, left L3-4 decompressive laminotomy and foraminotomy, left L4-5 decompressive laminotomy and foraminotomy  Surgeon:Anhad Sheeley A.Chene Kasinger, M.D.  Asst. Surgeon: Ronnald Ramp  Anesthesia: General  Indication: 74 year old male with severe bilateral lower extremity pain paresthesias and weakness which have failed conservative management. Workup demonstrates evidence of severe stenosis at L2-3 severe stenosis at W4-6 complicated by a large right-sided synovial cyst and spondylosis and stenosis on the left at L4-5. Patient presents now for three-level decompressive surgery in hopes of improving his symptoms.  Operative note: After induction of anesthesia, patient position prone onto Wilson frame and a properly padded. Lumbar region prepped and draped. Incision made overlying L3-4. Dissection performed. Retractor placed. X-ray taken and L4-5 level on the left was exposed. Decompressive laminotomy was then performed using high-speed drill and Kerrison rongeurs to remove the inferior aspect lamina of L4 medial aspect the L4-5 facet joint and the superior rim of the L5 lambert. Ligament flavum was elevated and resected so fashion using Kerrison rongeurs. Underlying thecal sac was then identified. Foraminotomies along the course exiting L4 and L5 nerve roots were performed bilaterally. No evidence of injury to thecal sac or nerve roots. Good decompression achieved. Procedure then repeated on the left at L2-3 and L3-4 again without complication. Dissection performed the right side exposing the lamina facet joints of L2-3 and L3-4. Retractor placed. Starting  first at L2-3 on the right side decompressive laminotomy and foraminotomies were performed a similar fashion as mentioned before. Once a good decompression was achieved without evidence of injury to thecal sac and nerve roots. At L3-4 decompressive laminotomies then performed using high-speed drill and Kerrison rongeurs and ligament flavum was elevated and resected thecal fashion using Kerrison rongeurs. Underlying thecal sac was identified as was a densely adherent synovial cyst. Using microdissection of the synovial cyst and spinal canal the synovial cyst was gently dissected free of the underlying thecal sac and L4 nerve root. The synovial cyst was completely resected. There is no evidence of overt instability. There is no evidence of injury to thecal sac and nerve roots. Once again the spinal canal was inspected her L5 laminotomy sites. Hemostasis was found to be good. Wounds were irrigated with and like solution. Gelfoam was placed topically over the laminotomy sites. Wounds and close in layers with Vicryl sutures. Steri-Strips and sterile dressing were applied. There were no apparent complications. Patient tolerated the procedure well and he returns to the recovery room postop.

## 2015-02-08 NOTE — Anesthesia Postprocedure Evaluation (Signed)
  Anesthesia Post-op Note  Patient: Joseph [name]  Procedure(s) Performed: Procedure(s): Laminectomy and Foraminotomy - bilateral - Lumbar two-three, three-four, left lumbar four-five with resection of synovial cyst  (Bilateral)  Patient Location: PACU  Anesthesia Type:General  Level of Consciousness: awake  Airway and Oxygen Therapy: Patient Spontanous Breathing  Post-op Pain: mild  Post-op Assessment: Post-op Vital signs reviewed  Post-op Vital Signs: Reviewed  Last Vitals:  Filed Vitals:   02/08/15 1204  BP: 148/81  Pulse: 55  Temp:   Resp:     Complications: No apparent anesthesia complications

## 2015-02-08 NOTE — Progress Notes (Signed)
Pt arrived to 4N12 via stretcher from PACU.  Pt ambulated to bathroom then bed.  VSS, oriented and settled in.  Call bell within reach.  SCD's applied.  Will continue to monitor. Cori Razor, RN

## 2015-02-08 NOTE — Progress Notes (Signed)
Pt unable to urinate.  Bladder scan >999.  In/Out cath, got out 1100.  Will continue to monitor. Cori Razor, RN

## 2015-02-09 DIAGNOSIS — M4806 Spinal stenosis, lumbar region: Secondary | ICD-10-CM | POA: Diagnosis not present

## 2015-02-09 MED ORDER — SENNA 8.6 MG PO TABS
1.0000 | ORAL_TABLET | Freq: Every day | ORAL | Status: DC
  Filled 2015-02-09: qty 1

## 2015-02-09 NOTE — Progress Notes (Signed)
Pt d/c to home by car with family. Assessment stable.

## 2015-02-09 NOTE — Discharge Summary (Signed)
Physician Discharge Summary  Patient ID: Joseph [name] MRN: [id number] DOB/AGE: 1941/11/08 74 y.o.  Admit date: 02/08/2015 Discharge date: 02/09/2015  Admission Diagnoses:lumbar stenosis  Discharge Diagnoses:  Principal Problem:   Spinal stenosis, lumbar region, with neurogenic claudication Active Problems:   Lumbar stenosis with neurogenic claudication   Discharged Condition: no pain. ambulating  Hospital Course: surgery  Consults: none.   Significant Diagnostic Studies: mri  Treatments: lumbar decompression  Discharge Exam: Blood pressure 112/61, pulse 74, temperature 97.7 F (36.5 C), temperature source Oral, resp. rate 20, height 5\' 9"  (1.753 m), weight 99.338 kg (219 lb), SpO2 98 %. No pain, no weakness. Bladder working. Wants to go home today  Disposition: home     Medication List    ASK your doctor about these medications        acetaminophen 500 MG tablet  Commonly known as:  TYLENOL  Take 500 mg by mouth every 6 (six) hours as needed (pain).     ALPRAZolam 0.5 MG tablet  Commonly known as:  XANAX  Take 0.5-1 mg by mouth 3 (three) times daily as needed for anxiety or sleep.     amiodarone 200 MG tablet  Commonly known as:  PACERONE  Take 1 tablet (200 mg total) by mouth daily.     bifidobacterium infantis capsule  Take 1 capsule by mouth daily.     cholecalciferol 1000 UNITS tablet  Commonly known as:  VITAMIN D  Take 1,000 Units by mouth daily.     HYDROcodone-acetaminophen 7.5-325 MG per tablet  Commonly known as:  NORCO  Take 1 tablet by mouth every 6 (six) hours as needed (pain).     Magnesium 250 MG Tabs  Take 250 mg by mouth daily.     metoprolol succinate 100 MG 24 hr tablet  Commonly known as:  TOPROL-XL  Take 1 tablet (100 mg total) by mouth daily.     MIRVASO 0.33 % Gel  Generic drug:  Brimonidine Tartrate  Apply 1 application topically daily.     olmesartan 40 MG tablet  Commonly known as:  BENICAR  Take 40 mg by mouth  daily.     OVER THE COUNTER MEDICATION  Apply 1 drop to eye daily as needed (for dry eyes). Equate brand of eye lubricant drops     pantoprazole 40 MG tablet  Commonly known as:  PROTONIX  Take 40 mg by mouth 2 (two) times daily.     riboflavin 100 MG Tabs tablet  Commonly known as:  VITAMIN B-2  Take 100 mg by mouth daily.     rosuvastatin 10 MG tablet  Commonly known as:  CRESTOR  Take 10 mg by mouth 3 (three) times a week.     sertraline 100 MG tablet  Commonly known as:  ZOLOFT  Take 100 mg by mouth daily.     XARELTO 20 MG Tabs tablet  Generic drug:  rivaroxaban  Take 20 mg by mouth daily.     zolpidem 10 MG tablet  Commonly known as:  AMBIEN  Take 10 mg by mouth at bedtime. For sleep         Signed: Floyce Stakes 02/09/2015, 9:17 AM

## 2015-02-11 ENCOUNTER — Encounter (HOSPITAL_COMMUNITY): Payer: Self-pay | Admitting: Neurosurgery

## 2015-03-06 ENCOUNTER — Ambulatory Visit
Admission: RE | Admit: 2015-03-06 | Discharge: 2015-03-06 | Disposition: A | Discharge status: Home / Self Care | Payer: Medicare Other | Source: Ambulatory Visit | Attending: Surgery | Admitting: Surgery

## 2015-03-06 ENCOUNTER — Encounter: Payer: Self-pay | Admitting: Surgery

## 2015-03-06 ENCOUNTER — Ambulatory Visit (INDEPENDENT_AMBULATORY_CARE_PROVIDER_SITE_OTHER): Payer: Medicare Other | Admitting: Surgery

## 2015-03-06 VITALS — BP 108/67 | HR 55 | Resp 16 | Ht 69.0 in | Wt 221.5 lb

## 2015-03-06 DIAGNOSIS — I712 Thoracic aortic aneurysm, without rupture, unspecified: Secondary | ICD-10-CM

## 2015-03-06 DIAGNOSIS — I7121 Aneurysm of the ascending aorta, without rupture: Secondary | ICD-10-CM

## 2015-03-06 MED ORDER — GADOBENATE DIMEGLUMINE 529 MG/ML IV SOLN
20.0000 mL | Freq: Once | INTRAVENOUS | Status: AC | PRN
  Administered 2015-03-06: 20 mL via INTRAVENOUS

## 2015-03-07 ENCOUNTER — Encounter: Payer: Self-pay | Admitting: Surgery

## 2015-03-07 NOTE — Progress Notes (Signed)
HPI:  The patient returns today for followup of an ascending aortic aneurysm. When I saw him on 02/28/2014 MRA showed a stable fusiform ascending aortic aneurysm with a diameter of 4.7-4.8 cm. He has lost 50 lbs over the past year by watching his diet closely and portion control. He recently underwent back surgery for lumbar spine stenosis. He is recovering fairly well.  Current Outpatient Prescriptions  Medication Sig Dispense Refill  . acetaminophen (TYLENOL) 500 MG tablet Take 500 mg by mouth every 6 (six) hours as needed (pain).    Marland Kitchen ALPRAZolam (XANAX) 0.5 MG tablet Take 0.5-1 mg by mouth 3 (three) times daily as needed for anxiety or sleep.     Marland Kitchen amiodarone (PACERONE) 200 MG tablet Take 1 tablet (200 mg total) by mouth daily. 180 tablet 3  . bifidobacterium infantis (ALIGN) capsule Take 1 capsule by mouth daily. 14 capsule 0  . Brimonidine Tartrate (MIRVASO) 0.33 % GEL Apply 1 application topically daily.    . cholecalciferol (VITAMIN D) 1000 UNITS tablet Take 1,000 Units by mouth daily.    . Magnesium 250 MG TABS Take 250 mg by mouth daily.    . metoprolol succinate (TOPROL-XL) 100 MG 24 hr tablet Take 1 tablet (100 mg total) by mouth daily.    Marland Kitchen olmesartan (BENICAR) 40 MG tablet Take 40 mg by mouth daily.    Marland Kitchen OVER THE COUNTER MEDICATION Apply 1 drop to eye daily as needed (for dry eyes). Equate brand of eye lubricant drops    . pantoprazole (PROTONIX) 40 MG tablet Take 40 mg by mouth 2 (two) times daily.     . riboflavin (VITAMIN B-2) 100 MG TABS tablet Take 100 mg by mouth daily.    . rosuvastatin (CRESTOR) 10 MG tablet Take 10 mg by mouth 3 (three) times a week.     . sertraline (ZOLOFT) 100 MG tablet Take 100 mg by mouth daily.  0  . XARELTO 20 MG TABS tablet Take 20 mg by mouth daily.  3  . zolpidem (AMBIEN) 10 MG tablet Take 10 mg by mouth at bedtime. For sleep    . [DISCONTINUED] famotidine (PEPCID AC) 10 MG chewable tablet Chew 10 mg by mouth as needed.       No current  facility-administered medications for this visit.     Physical Exam: BP 108/67 mmHg  Pulse 55  Resp 16  Ht 5\' 9"  (1.753 m)  Wt 221 lb 8 oz (100.472 kg)  BMI 32.69 kg/m2  SpO2 97% He looks well.  Cardiac exam shows a regular rate and rhythm with normal heart sounds. There is no murmur.  His lung exam is clear.   Diagnostic Tests:  CLINICAL DATA: Aortic aneurysm  EXAM: MRA CHEST WITH OR WITHOUT CONTRAST  TECHNIQUE: Angiographic images of the chest were obtained using MRA technique without and with intravenous contrast.  CONTRAST: 85mL MULTIHANCE GADOBENATE DIMEGLUMINE 529 MG/ML IV SOLN  COMPARISON: 02/28/2014  FINDINGS: Maximal diameter of the ascending aorta is 4.9 cm. Diameters at the sinus of Valsalva and sino-tubular junction are 4.4 cm and 3.9 cm respectively. Previously, maximal diameter was 4.8 cm. Great vessels are patent. Bilateral vertebral arteries are patent within the confines of the study. There is no evidence of dissection or intramural hematoma. No evidence of mediastinal mass effect. The lungs are grossly clear.  IMPRESSION: Maximal diameter of the ascending aorta is 4.9 cm and was previously measured at 4.8 cm. It has slightly enlarged since the prior  study. No evidence of dissection or intramural hematoma.   Electronically Signed  By: Marybelle Killings M.D.  On: 03/06/2015 12:26   Impression:  The ascending aortic aneurysm is slightly enlarged by 1 mm to 4.9 cm, which is really within the limits of measurement error. It looks unchanged to me. His BP is under good control. I don't think there is any indication for surgical treatment at this time.  Plan:  I will plan to see back in one year with a CTA of the chest.   Gaye Pollack, MD [facility] [phone]

## 2015-04-25 ENCOUNTER — Encounter: Payer: Self-pay | Admitting: Interventional Cardiology

## 2015-05-08 ENCOUNTER — Ambulatory Visit (INDEPENDENT_AMBULATORY_CARE_PROVIDER_SITE_OTHER): Payer: Medicare Other | Admitting: Interventional Cardiology

## 2015-05-08 ENCOUNTER — Encounter: Payer: Self-pay | Admitting: Interventional Cardiology

## 2015-05-08 VITALS — BP 138/80 | HR 53 | Ht 69.0 in | Wt 231.1 lb

## 2015-05-08 DIAGNOSIS — I480 Paroxysmal atrial fibrillation: Secondary | ICD-10-CM

## 2015-05-08 DIAGNOSIS — I712 Thoracic aortic aneurysm, without rupture, unspecified: Secondary | ICD-10-CM

## 2015-05-08 DIAGNOSIS — I484 Atypical atrial flutter: Secondary | ICD-10-CM

## 2015-05-08 DIAGNOSIS — I10 Essential (primary) hypertension: Secondary | ICD-10-CM

## 2015-05-08 DIAGNOSIS — Z7901 Long term (current) use of anticoagulants: Secondary | ICD-10-CM

## 2015-05-08 DIAGNOSIS — Z79899 Other long term (current) drug therapy: Secondary | ICD-10-CM

## 2015-05-08 MED ORDER — POTASSIUM CHLORIDE CRYS ER 20 MEQ PO TBCR: 20.0000 meq | EXTENDED_RELEASE_TABLET | ORAL | Status: DC

## 2015-05-08 MED ORDER — HYDROCHLOROTHIAZIDE 12.5 MG PO CAPS: 12.5000 mg | ORAL_CAPSULE | ORAL | Status: DC

## 2015-05-08 NOTE — Patient Instructions (Signed)
Medication Instructions:  Your physician has recommended you make the following change in your medication:  1) START HCTZ 12.5mg  every Mon,Wed, Fri. An Rx has been sent to your pharmacy 2) START K-Dur (Potassium) 38meq every Mon,Wed,Fri. An Rx has been sent to your pharmacy  Labwork: Your physician recommends that you return for lab work on 06/11/15 ([facility]) between 7:30am-5:15pm  Testing/Procedures: None   Follow-Up: Your physician wants you to follow-up in: 6 months with Dr.Smith You will receive a reminder letter in the mail two months in advance. If you don't receive a letter, please call our office to schedule the follow-up appointment.   Any Other Special Instructions Will Be Listed Below (If Applicable). Monitor  your blood pressure a couple times of week for 1 month. Call the office if your bp in consistently over 140/90

## 2015-05-08 NOTE — Progress Notes (Signed)
Cardiology Office Note   Date:  05/08/2015   ID:  Joseph [name], DOB 05-Nov-1941, MRN [id number]  PCP:  Reginia Naas, MD  Cardiologist:  [name], MD   Chief Complaint  Patient presents with  . Atrial Fibrillation  . Hypertension      History of Present Illness: Joseph [name] is a 74 y.o. male who presents for  Chronic diastolic heart failure, paroxysmal atrial fibrillation, aortic aneurysm, history of atrial fibrillation ablation , and essential hypertension.    Doing well without complaints. No neurological complaints. No blood in the shortness stool.   At lumbar spine surgery and is feeling much better.  Past Medical History  Diagnosis Date  . Depression   . Headache(784.0)   . GERD (gastroesophageal reflux disease)   . Allergy   . Hypertension   . Hyperlipidemia   . Thoracic aneurysm   . Colon polyp   . Atrial fibrillation   . Panic attacks   . Diverticulosis   . Atypical atrial flutter 07/13/2014    Diagnosis on EKG 07/13/14 : Cardioversion August 2015  . Atrial fibrillation   . Dysrhythmia   . PONV (postoperative nausea and vomiting)     Past Surgical History  Procedure Laterality Date  . Appendectomy  1958  . Cardiac catheterization  2009  . Inguinal hernia repair      right  . Vasectomy    . Cardioversion  03/18/2012    Procedure: CARDIOVERSION;  Surgeon: [name], MD;  Location: [facility];  Service: Cardiovascular;  Laterality: N/A;  . Ablataion  07/06/2012  . Cardioversion N/A 08/23/2014    Procedure: CARDIOVERSION;  Surgeon: [name], MD;  Location: [facility] ENDOSCOPY;  Service: Cardiovascular;  Laterality: N/A;  . Spinal tap    . Lumbar laminectomy/decompression microdiscectomy Bilateral 02/08/2015    Procedure: Laminectomy and Foraminotomy - bilateral - Lumbar two-three, three-four, left lumbar four-five with resection of synovial cyst ;  Surgeon: Charlie Pitter, MD;  Location: [place] NEURO ORS;  Service: Neurosurgery;   Laterality: Bilateral;     Current Outpatient Prescriptions  Medication Sig Dispense Refill  . acetaminophen (TYLENOL) 500 MG tablet Take 500 mg by mouth every 6 (six) hours as needed (pain).    Marland Kitchen ALPRAZolam (XANAX) 0.5 MG tablet Take 0.5-1 mg by mouth 3 (three) times daily as needed for anxiety or sleep.     Marland Kitchen amiodarone (PACERONE) 200 MG tablet Take 1 tablet (200 mg total) by mouth daily. 180 tablet 3  . Brimonidine Tartrate (MIRVASO) 0.33 % GEL Apply 1 application topically daily.    . metoprolol succinate (TOPROL-XL) 100 MG 24 hr tablet Take 1 tablet (100 mg total) by mouth daily.    Marland Kitchen olmesartan (BENICAR) 40 MG tablet Take 40 mg by mouth daily.    Marland Kitchen OVER THE COUNTER MEDICATION Apply 1 drop to eye daily as needed (for dry eyes). Equate brand of eye lubricant drops    . pantoprazole (PROTONIX) 40 MG tablet Take 40 mg by mouth 2 (two) times daily.     . rosuvastatin (CRESTOR) 10 MG tablet Take 10 mg by mouth 3 (three) times a week.     . sertraline (ZOLOFT) 100 MG tablet Take 100 mg by mouth daily.  0  . XARELTO 20 MG TABS tablet Take 20 mg by mouth daily.  3  . zolpidem (AMBIEN) 10 MG tablet Take 10 mg by mouth at bedtime as needed for sleep.    . [  DISCONTINUED] famotidine (PEPCID AC) 10 MG chewable tablet Chew 10 mg by mouth as needed.       No current facility-administered medications for this visit.    Allergies:   Review of patient's allergies indicates no known allergies.    Social History:  The patient  reports that he has never smoked. He has never used smokeless tobacco. He reports that he does not drink alcohol or use illicit drugs.   Family History:  The patient's family history includes Heart attack in his brother and mother; Heart disease in his father and maternal grandfather; Heart disease (age of onset: 53) in his mother; Kidney disease (age of onset: 26) in his brother; Lung cancer in his father.    ROS:  Please see the history of present illness.   Otherwise, review  of systems are positive for  Excessive fatigue, shortness of breath with activity, and still some back discomfort.   All other systems are reviewed and negative.    PHYSICAL EXAM: VS:  BP 138/80 mmHg  Pulse 53  Ht 5\' 9"  (1.753 m)  Wt 231 lb 1.9 oz (104.835 kg)  BMI 34.11 kg/m2  SpO2 97% , BMI Body mass index is 34.11 kg/(m^2). GEN: Well nourished, well developed, in no acute distress HEENT: normal Neck: no JVD, carotid bruits, or masses Cardiac: RRR; no murmurs, rubs, or gallops,no edema  Respiratory:  clear to auscultation bilaterally, normal work of breathing GI: soft, nontender, nondistended, + BS MS: no deformity or atrophy Skin: warm and dry, no rash Neuro:  Strength and sensation are intact Psych: euthymic mood, full affect   EKG:  EKG is not ordered today.    Recent Labs: 01/31/2015: BUN 8; Creatinine 1.12; Hemoglobin 15.5; Platelets 225; Potassium 3.6; Sodium 138    Lipid Panel    Component Value Date/Time   CHOL 150 05/13/2011 0900   TRIG 87.0 05/13/2011 0900   HDL 46.40 05/13/2011 0900   CHOLHDL 3 05/13/2011 0900   VLDL 17.4 05/13/2011 0900   LDLCALC 86 05/13/2011 0900      Wt Readings from Last 3 Encounters:  05/08/15 231 lb 1.9 oz (104.835 kg)  03/06/15 221 lb 8 oz (100.472 kg)  03/06/15 220 lb (99.791 kg)      Other studies Reviewed: Additional studies/ records that were reviewed today include: . Review of the above records demonstrates:    ASSESSMENT AND PLAN:  On amiodarone therapy:  No evidence of toxicity. Liver panel is unremarkable which was recently performed by Dr. Tamala Julian at [facility]  on 04/23/2015  Paroxysmal atrial fibrillation : No recurrence  Atypical atrial flutter : No recurrence  Chronic anticoagulation : No complications  Essential hypertension : Poorly controlled  Aneurysm of thoracic aorta colon stable     Current medicines are reviewed at length with the patient today.  The patient has concerns regarding  medicines.  The following changes have been made:   Because the blood pressure is not as well-controlled as we would like, we will add hydrochlorothiazide 12.5 mg on Monday Wednesday and Friday along with 20 mEq of potassium on Monday Wednesday and Friday.   He will have a basic metabolic panel performed in one month. He will record blood pressures 2-3 times per week for a month to document that his pressure is improved. Target is 140/90 or less.  Labs/ tests ordered today include:  No orders of the defined types were placed in this encounter.     Disposition:   FU with HS in  6 months  Signed, [name], MD  05/08/2015 1:45 PM    [facility] [place], [place], [place]  [address] Phone: [phone]; Fax: [phone]

## 2015-06-11 ENCOUNTER — Other Ambulatory Visit (INDEPENDENT_AMBULATORY_CARE_PROVIDER_SITE_OTHER): Payer: Medicare Other | Admitting: *Deleted

## 2015-06-11 DIAGNOSIS — I10 Essential (primary) hypertension: Secondary | ICD-10-CM | POA: Diagnosis not present

## 2015-06-11 LAB — BASIC METABOLIC PANEL
BUN: 17 mg/dL (ref 6–23)
CHLORIDE: 103 meq/L (ref 96–112)
CO2: 30 meq/L (ref 19–32)
CREATININE: 1.16 mg/dL (ref 0.40–1.50)
Calcium: 9.5 mg/dL (ref 8.4–10.5)
GFR: 65.43 mL/min (ref >60.00)
Glucose, Bld: 100 mg/dL — ABNORMAL HIGH (ref 70–99)
POTASSIUM: 3.7 meq/L (ref 3.5–5.1)
Sodium: 139 mEq/L (ref 135–145)

## 2015-06-19 ENCOUNTER — Ambulatory Visit: Payer: Medicare Other | Admitting: Diagnostic Neuroimaging

## 2015-07-18 ENCOUNTER — Other Ambulatory Visit: Payer: Self-pay | Admitting: Interventional Cardiology

## 2015-07-19 ENCOUNTER — Other Ambulatory Visit: Payer: Self-pay

## 2015-07-19 MED ORDER — XARELTO 20 MG PO TABS: 20.0000 mg | ORAL_TABLET | Freq: Every day | ORAL | Status: DC

## 2015-07-19 NOTE — Telephone Encounter (Signed)
Rx(s) sent to pharmacy electronically.  

## 2015-10-30 ENCOUNTER — Other Ambulatory Visit: Payer: Self-pay | Admitting: Interventional Cardiology

## 2015-11-27 ENCOUNTER — Ambulatory Visit: Payer: Medicare Other | Admitting: Interventional Cardiology

## 2015-12-10 ENCOUNTER — Encounter: Payer: Self-pay | Admitting: Interventional Cardiology

## 2015-12-10 ENCOUNTER — Ambulatory Visit (INDEPENDENT_AMBULATORY_CARE_PROVIDER_SITE_OTHER): Payer: Medicare Other | Admitting: Interventional Cardiology

## 2015-12-10 VITALS — BP 138/90 | HR 54 | Ht 69.0 in | Wt 266.8 lb

## 2015-12-10 DIAGNOSIS — E785 Hyperlipidemia, unspecified: Secondary | ICD-10-CM

## 2015-12-10 DIAGNOSIS — I712 Thoracic aortic aneurysm, without rupture, unspecified: Secondary | ICD-10-CM

## 2015-12-10 DIAGNOSIS — I480 Paroxysmal atrial fibrillation: Secondary | ICD-10-CM | POA: Diagnosis not present

## 2015-12-10 DIAGNOSIS — I484 Atypical atrial flutter: Secondary | ICD-10-CM

## 2015-12-10 DIAGNOSIS — Z7901 Long term (current) use of anticoagulants: Secondary | ICD-10-CM

## 2015-12-10 DIAGNOSIS — Z79899 Other long term (current) drug therapy: Secondary | ICD-10-CM | POA: Diagnosis not present

## 2015-12-10 DIAGNOSIS — I10 Essential (primary) hypertension: Secondary | ICD-10-CM

## 2015-12-10 LAB — HEPATIC FUNCTION PANEL
ALK PHOS: 67 U/L (ref 40–115)
ALT: 17 U/L (ref 9–46)
AST: 18 U/L (ref 10–35)
Albumin: 4.4 g/dL (ref 3.6–5.1)
BILIRUBIN DIRECT: 0.1 mg/dL (ref <=0.2)
BILIRUBIN INDIRECT: 0.3 mg/dL (ref 0.2–1.2)
BILIRUBIN TOTAL: 0.4 mg/dL (ref 0.2–1.2)
TOTAL PROTEIN: 6.7 g/dL (ref 6.1–8.1)

## 2015-12-10 LAB — TSH: TSH: 2.313 u[IU]/mL (ref 0.350–4.500)

## 2015-12-10 MED ORDER — POTASSIUM CHLORIDE CRYS ER 20 MEQ PO TBCR: 20.0000 meq | EXTENDED_RELEASE_TABLET | ORAL | Status: DC

## 2015-12-10 MED ORDER — HYDROCHLOROTHIAZIDE 12.5 MG PO CAPS: 12.5000 mg | ORAL_CAPSULE | ORAL | Status: DC

## 2015-12-10 NOTE — Patient Instructions (Signed)
Medication Instructions:  Your physician recommends that you continue on your current medications as directed. Please refer to the Current Medication list given to you today.   Labwork: Tsh, Hepatic  Testing/Procedures: None ordered  Follow-Up: Your physician wants you to follow-up in: 6 months with Dr.Smith You will receive a reminder letter in the mail two months in advance. If you don't receive a letter, please call our office to schedule the follow-up appointment.   Any Other Special Instructions Will Be Listed Below (If Applicable).     If you need a refill on your cardiac medications before your next appointment, please call your pharmacy.   

## 2015-12-10 NOTE — Progress Notes (Signed)
Cardiology Office Note   Date:  12/10/2015   ID:  [name], DOB 02-21-1941, MRN [id number]  PCP:  Joseph Naas, MD  Cardiologist:  Joseph Grooms, MD   Chief Complaint  Patient presents with  . Atrial Fibrillation      History of Present Illness: Joseph [name] is a 74 y.o. male who presents for  Paroxysmal atrial fibrillation, atrial flutter, history of atrial flutter and flutter ablation 2014 in [facility] thoracic aneurysm, essential hypertension, an chronic amiodarone therapy.   Overall Joseph [name] is doing well. He does not believe he has had any prolonged episode of atrial fibrillation. He denies syncope an exertional fatigue. There has been no bleeding on his current medical regimen. He denies neurological complaints. He has not had back discomfort or lower extremity swelling. Appetite is been stable. No change in urine color.    Past Medical History  Diagnosis Date  . Depression   . Headache(784.0)   . GERD (gastroesophageal reflux disease)   . Allergy   . Hypertension   . Hyperlipidemia   . Thoracic aneurysm   . Colon polyp   . Atrial fibrillation   . Panic attacks   . Diverticulosis   . Atypical atrial flutter 07/13/2014    Diagnosis on EKG 07/13/14 : Cardioversion August 2015  . Atrial fibrillation   . Dysrhythmia   . PONV (postoperative nausea and vomiting)     Past Surgical History  Procedure Laterality Date  . Appendectomy  1958  . Cardiac catheterization  2009  . Inguinal hernia repair      right  . Vasectomy    . Cardioversion  03/18/2012    Procedure: CARDIOVERSION;  Surgeon: Joseph Grooms, MD;  Location: [facility];  Service: Cardiovascular;  Laterality: N/A;  . Ablataion  07/06/2012  . Cardioversion N/A 08/23/2014    Procedure: CARDIOVERSION;  Surgeon: Joseph Grooms, MD;  Location: [facility] ENDOSCOPY;  Service: Cardiovascular;  Laterality: N/A;  . Spinal tap    . Lumbar laminectomy/decompression microdiscectomy Bilateral 02/08/2015      Procedure: Laminectomy and Foraminotomy - bilateral - Lumbar two-three, three-four, left lumbar four-five with resection of synovial cyst ;  Surgeon: Joseph Pitter, MD;  Location: [place] NEURO ORS;  Service: Neurosurgery;  Laterality: Bilateral;     Current Outpatient Prescriptions  Medication Sig Dispense Refill  . acetaminophen (TYLENOL) 500 MG tablet Take 500 mg by mouth every 6 (six) hours as needed (pain).    Marland Kitchen ALPRAZolam (XANAX) 0.5 MG tablet Take 0.5-1 mg by mouth 3 (three) times daily as needed for anxiety or sleep.     Marland Kitchen amiodarone (PACERONE) 200 MG tablet Take 200 mg by mouth daily.  1  . Brimonidine Tartrate (MIRVASO) 0.33 % GEL Apply 1 application topically daily.    . hydrochlorothiazide (MICROZIDE) 12.5 MG capsule Take 1 capsule (12.5 mg total) by mouth every Monday, Wednesday, and Friday. 36 capsule 3  . metoprolol succinate (TOPROL-XL) 100 MG 24 hr tablet Take 1 tablet (100 mg total) by mouth daily.    Marland Kitchen olmesartan (BENICAR) 40 MG tablet Take 40 mg by mouth daily.    Marland Kitchen OVER THE COUNTER MEDICATION Apply 1 drop to eye daily as needed (for dry eyes). Equate brand of eye lubricant drops    . pantoprazole (PROTONIX) 40 MG tablet Take 40 mg by mouth daily.  0  . potassium chloride SA (K-DUR,KLOR-CON) 20 MEQ tablet Take 1 tablet (20 mEq total) by mouth every Monday,  Wednesday, and Friday. Take the same days you take HCTZ 36 tablet 3  . rosuvastatin (CRESTOR) 10 MG tablet Take 10 mg by mouth 3 (three) times a week.     . sertraline (ZOLOFT) 100 MG tablet Take 100 mg by mouth daily.  0  . XARELTO 20 MG TABS tablet Take 1 tablet (20 mg total) by mouth daily. 30 tablet 7  . [DISCONTINUED] famotidine (PEPCID AC) 10 MG chewable tablet Chew 10 mg by mouth as needed.       No current facility-administered medications for this visit.    Allergies:   Review of patient's allergies indicates no known allergies.    Social History:  The patient  reports that he has never smoked. He has never  used smokeless tobacco. He reports that he does not drink alcohol or use illicit drugs.   Family History:  The patient's family history includes Heart attack in his brother and mother; Heart disease in his father and maternal grandfather; Heart disease (age of onset: 71) in his mother; Kidney disease (age of onset: 39) in his brother; Lung cancer in his father.    ROS:  Please see the history of present illness.   Otherwise, review of systems are positive for  Weight gain is discouraging. Otherwise no complaints.   All other systems are reviewed and negative.    PHYSICAL EXAM: VS:  BP 138/90 mmHg  Pulse 54  Ht 5\' 9"  (1.753 m)  Wt 266 lb 12.8 oz (121.02 kg)  BMI 39.38 kg/m2 , BMI Body mass index is 39.38 kg/(m^2). GEN: Well nourished, well developed, in no acute distress. Obese. HEENT: normal Neck: no JVD, carotid bruits, or masses Cardiac: RRR.  There is no murmur, rub, or gallop. There is no edema. Respiratory:  clear to auscultation bilaterally, normal work of breathing. GI: soft, nontender, nondistended, + BS MS: no deformity or atrophy Skin: warm and dry, no rash Neuro:  Strength and sensation are intact Psych: euthymic mood, full affect   EKG:  EKG is ordered today. The ekg reveals  Sinus bradycardia incomplete right bundle , left axis deviation, poor R-wave progression, an first-degree AV block. No significant change when compared to prior.   Recent Labs: 01/31/2015: Hemoglobin 15.5; Platelets 225 06/11/2015: BUN 17; Creatinine, Ser 1.16; Potassium 3.7; Sodium 139    Lipid Panel    Component Value Date/Time   CHOL 150 05/13/2011 0900   TRIG 87.0 05/13/2011 0900   HDL 46.40 05/13/2011 0900   CHOLHDL 3 05/13/2011 0900   VLDL 17.4 05/13/2011 0900   LDLCALC 86 05/13/2011 0900      Wt Readings from Last 3 Encounters:  12/10/15 266 lb 12.8 oz (121.02 kg)  05/08/15 231 lb 1.9 oz (104.835 kg)  03/06/15 221 lb 8 oz (100.472 kg)      Other studies Reviewed: Additional  studies/ records that were reviewed today include:  Electronic health records. The findings include  Most recent laboratory data.    ASSESSMENT AND PLAN:  1. Paroxysmal atrial fibrillation (HCC)  no significant or prolonged recurrent sense ablation. He does have occasional episodes of palpitation neck and last up to 30 minutes these are rare. - EKG 12-Lead  2. On amiodarone therapy  No side effects have been noted - EKG 12-Lead - TSH - Hepatic function panel  3. Atypical atrial flutter (HCC)  Stable since ablation - EKG 12-Lead  4. Chronic anticoagulation  no bleeding on current anticoagulation regimen ,Xarelto.  5. Essential hypertension  Well controlled.  Encouraged to lose weight and decrease salt in his diet - EKG 12-Lead - potassium chloride SA (K-DUR,KLOR-CON) 20 MEQ tablet; Take 1 tablet (20 mEq total) by mouth every Monday, Wednesday, and Friday. Take the same days you take HCTZ  Dispense: 36 tablet; Refill: 3 - hydrochlorothiazide (MICROZIDE) 12.5 MG capsule; Take 1 capsule (12.5 mg total) by mouth every Monday, Wednesday, and Friday.  Dispense: 36 capsule; Refill: 3  6. Thoracic aortic aneurysm without rupture ([place])  we will discuss follow-up studies at the next office visit  7. Hyperlipemia  followed by primary care    Current medicines are reviewed at length with the patient today.  The patient has the following concerns regarding medicines:  none.  The following changes/actions have been instituted:     TSH and hepatic panel will be done today   Decrease carbohydrates and diet and try to lose weight.  Labs/ tests ordered today include:  Orders Placed This Encounter  Procedures  . TSH  . Hepatic function panel  . EKG 12-Lead     Disposition:   FU with HS in 6 months  Signed, Joseph Grooms, MD  12/10/2015 5:16 PM    [facility] [place], [place], [place]  [address] Phone: [phone]; Fax: [phone]

## 2015-12-13 ENCOUNTER — Ambulatory Visit: Payer: Medicare Other | Admitting: Interventional Cardiology

## 2015-12-24 ENCOUNTER — Other Ambulatory Visit: Payer: Self-pay | Admitting: Interventional Cardiology

## 2016-02-14 IMAGING — CT CT ABD-PELV W/ CM
2 of 5 series · 17 of 46 positions shown, 19 images · IV contrast (Omnipaque 300)
Comparison: None.

CLINICAL DATA: Dysphagia, decreased appetite, nausea without
vomiting.

EXAM:
CT ABDOMEN AND PELVIS WITH CONTRAST
TECHNIQUE: Multidetector CT imaging of the abdomen and pelvis was performed
using the standard protocol following bolus administration of
intravenous contrast.
CONTRAST:  100mL OMNIPAQUE IOHEXOL 300 MG/ML  SOLN

[Series 2: abd/ pel 5mm · axial · 0.85mm/px · z∈[-481,-66]mm · 14 of 93 slices shown, 16 images]
[im 5/93  soft-tissue]
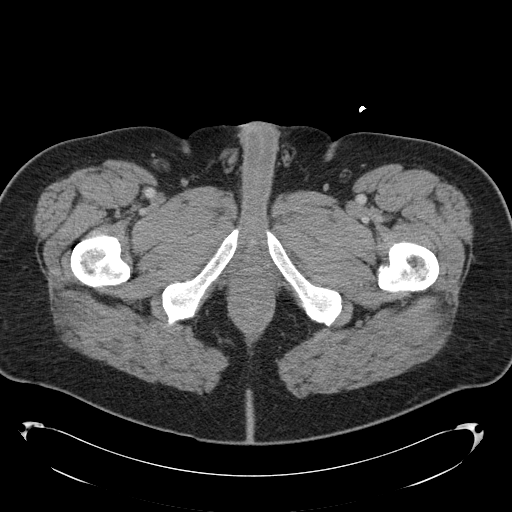
[im 5/93  bone]
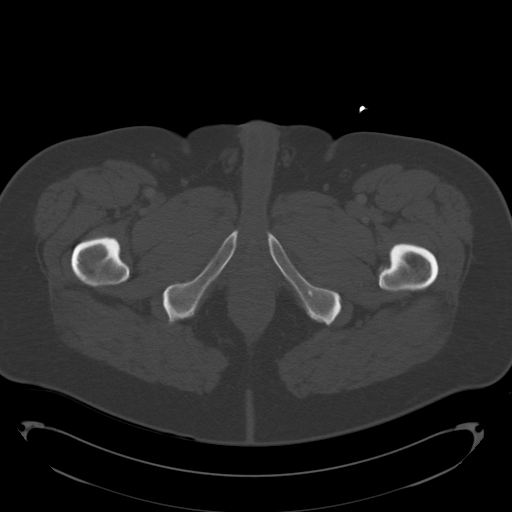
[im 10/93  soft-tissue]
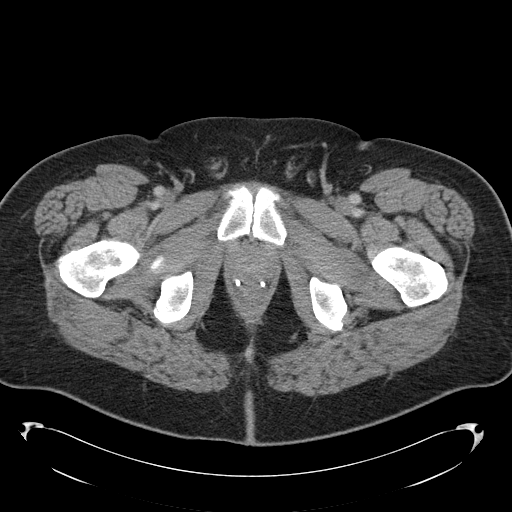
[im 20/93  soft-tissue]
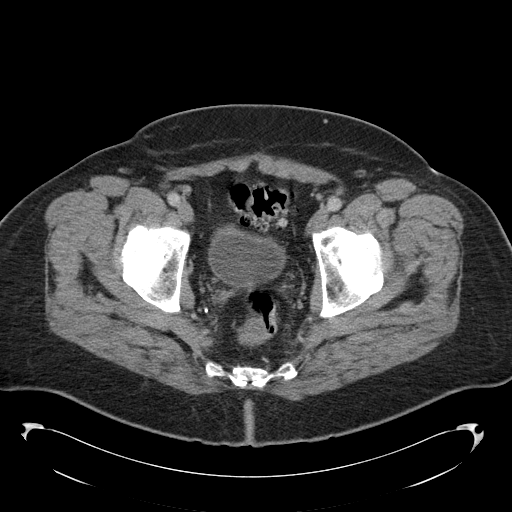
[im 25/93  soft-tissue]
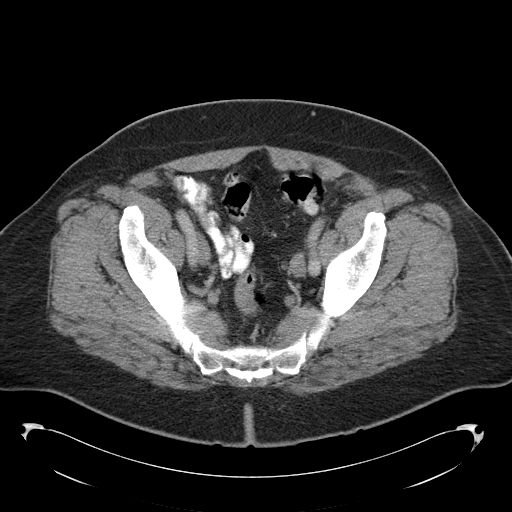
[im 30/93  soft-tissue]
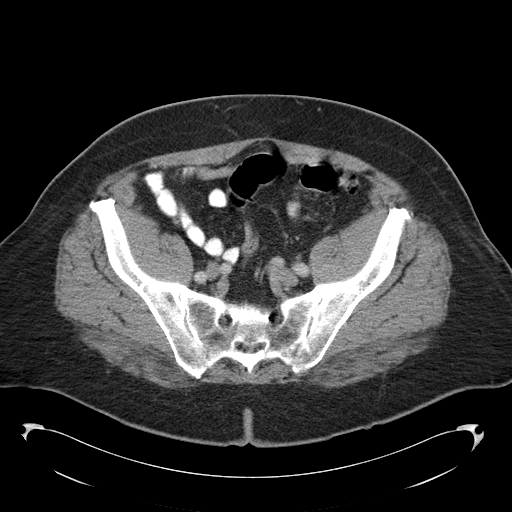
[im 39/93  soft-tissue]
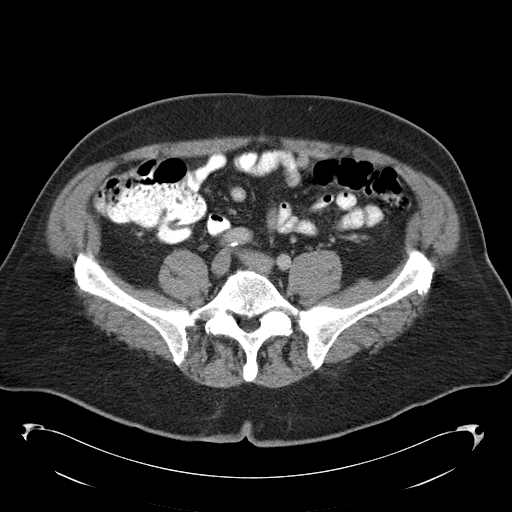
[im 44/93  soft-tissue]
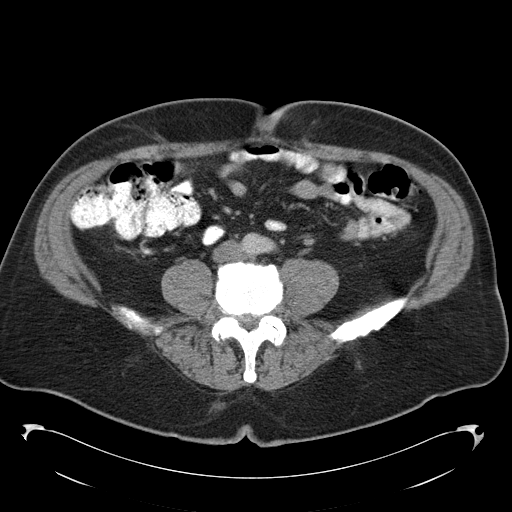
[im 49/93  soft-tissue]
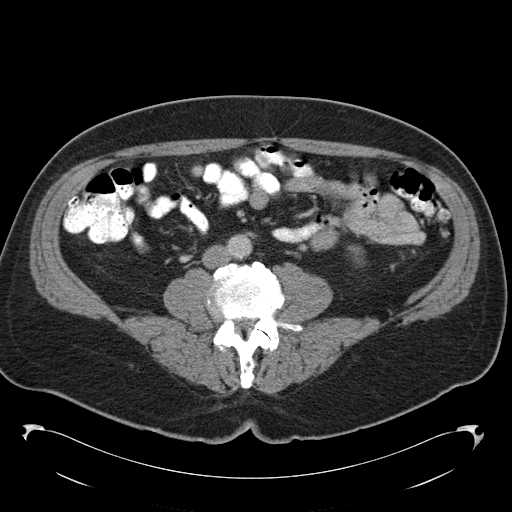
[im 54/93  soft-tissue]
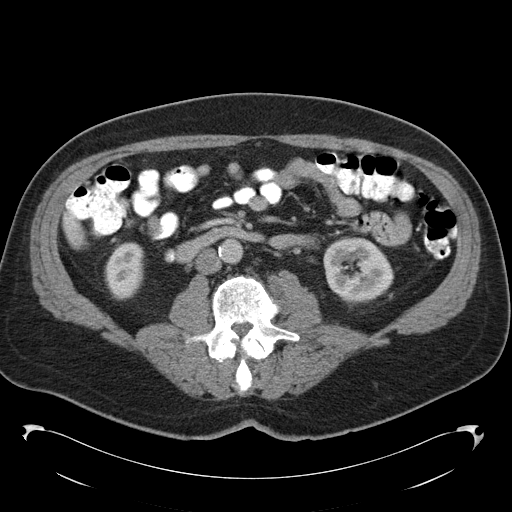
[im 54/93  bone]
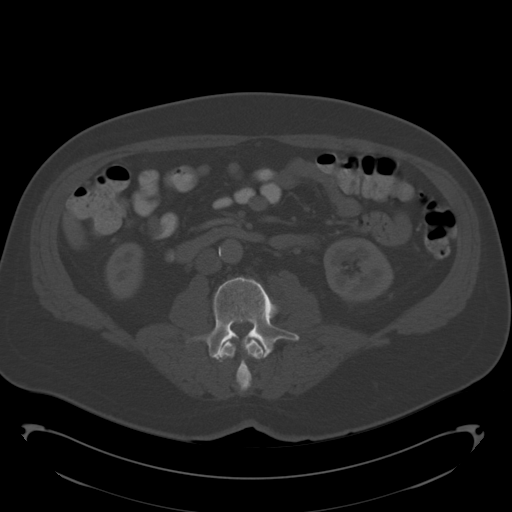
[im 63/93  soft-tissue]
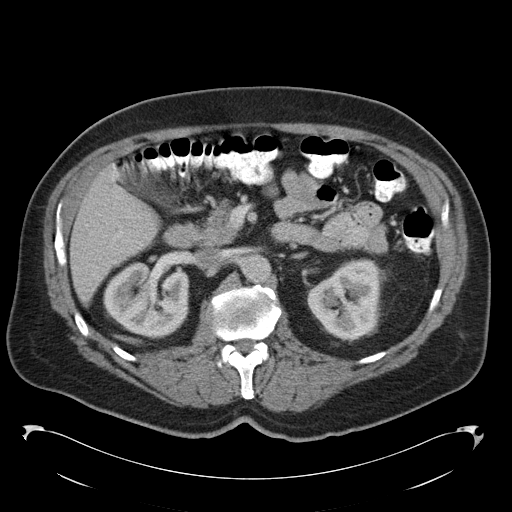
[im 68/93  soft-tissue]
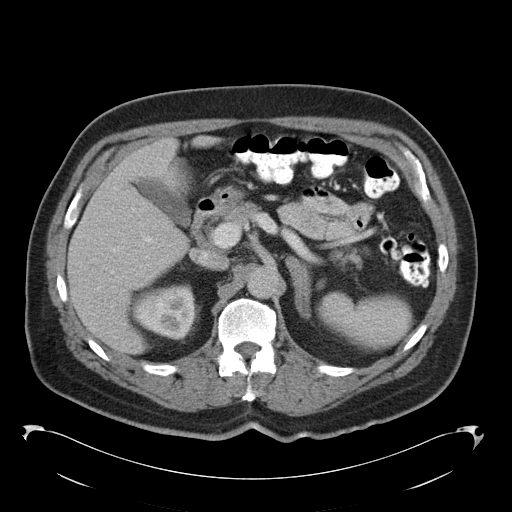
[im 73/93  soft-tissue]
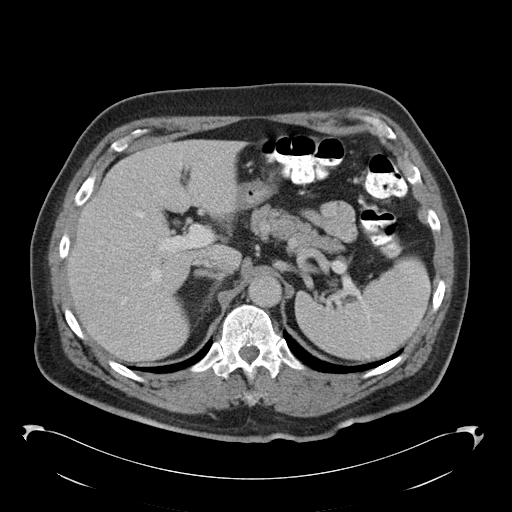
[im 83/93  soft-tissue]
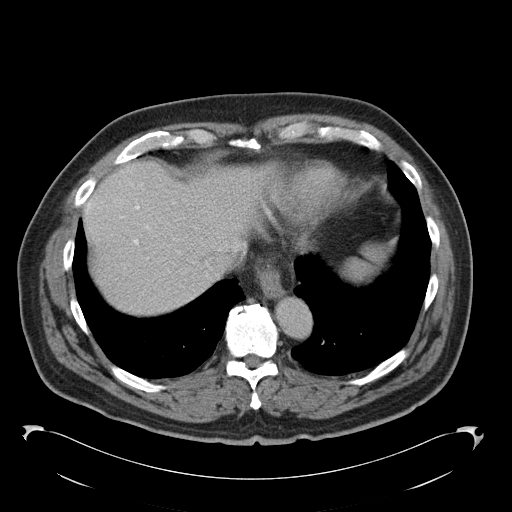
[im 88/93  soft-tissue]
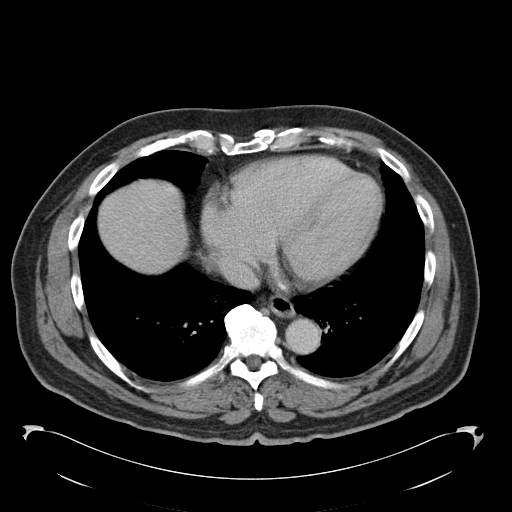

[Series 602: cor · coronal · 0.94mm/px · 3 of 140 slices shown]
[im 47/140  soft-tissue]
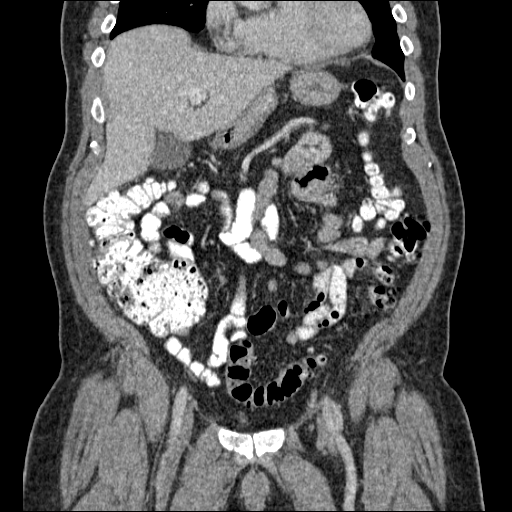
[im 62/140  soft-tissue]
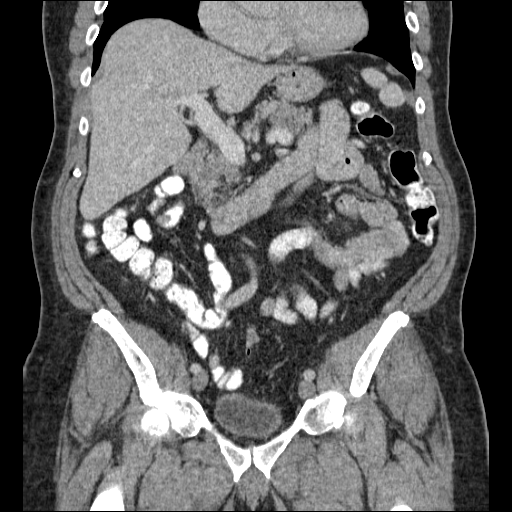
[im 78/140  soft-tissue]
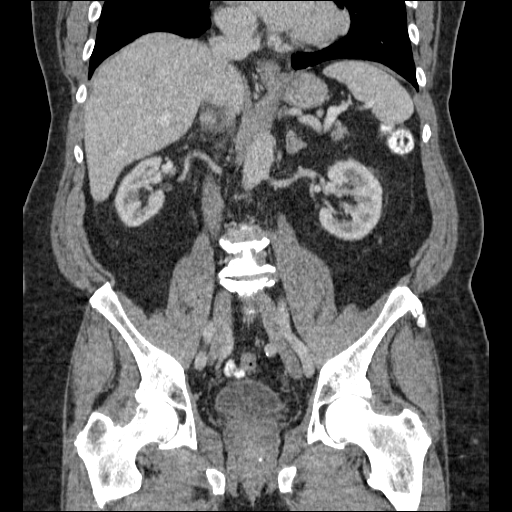

[17 of 46 positions shown; findings below may reference images not displayed]

FINDINGS: Lower chest:  Lung bases are clear.  No pericardial fluid

Hepatobiliary: No focal hepatic lesion. Gallbladder is normal. No
biliary duct dilatation.

Pancreas: Pancreas is normal. No duct dilatation. No pancreatic
inflammation.

Spleen: Normal spleen

Adrenals/urinary tract: Normal adrenal glands. Kidneys are normal.
Ureters and bladder are normal.

Stomach/Bowel: Stomach, small bowel, cecum are normal. The colon
demonstrates multiple diverticula of the descending colon and
sigmoid colon without acute inflammation. Rectum is normal.

Vascular/Lymphatic: Abdominal aorta is normal caliber. There is no
retroperitoneal or periportal lymphadenopathy. No pelvic
lymphadenopathy.

Reproductive: Prostate gland is normal.

Musculoskeletal: No aggressive osseous lesion.

Other: No omental or peritoneal disease.
IMPRESSION: 1. No acute abdominal pelvic findings.
2. Colonic diverticulosis without evidence of diverticulitis.

## 2016-02-25 ENCOUNTER — Other Ambulatory Visit: Payer: Self-pay | Admitting: *Deleted

## 2016-02-25 DIAGNOSIS — I712 Thoracic aortic aneurysm, without rupture, unspecified: Secondary | ICD-10-CM

## 2016-03-23 LAB — CREATININE, SERUM: Creat: 1.18 mg/dL (ref 0.70–1.18)

## 2016-03-25 ENCOUNTER — Ambulatory Visit
Admission: RE | Admit: 2016-03-25 | Discharge: 2016-03-25 | Disposition: A | Discharge status: Home / Self Care | Payer: Medicare Other | Source: Ambulatory Visit | Attending: Surgery | Admitting: Surgery

## 2016-03-25 ENCOUNTER — Encounter: Payer: Self-pay | Admitting: Surgery

## 2016-03-25 ENCOUNTER — Ambulatory Visit (INDEPENDENT_AMBULATORY_CARE_PROVIDER_SITE_OTHER): Payer: Medicare Other | Admitting: Surgery

## 2016-03-25 VITALS — BP 142/86 | HR 58 | Resp 20 | Ht 69.0 in | Wt 266.0 lb

## 2016-03-25 DIAGNOSIS — I712 Thoracic aortic aneurysm, without rupture, unspecified: Secondary | ICD-10-CM

## 2016-03-25 DIAGNOSIS — I7121 Aneurysm of the ascending aorta, without rupture: Secondary | ICD-10-CM

## 2016-03-25 MED ORDER — IOPAMIDOL (ISOVUE-370) INJECTION 76%
75.0000 mL | Freq: Once | INTRAVENOUS | Status: AC | PRN
  Administered 2016-03-25: 75 mL via INTRAVENOUS

## 2016-03-29 ENCOUNTER — Encounter: Payer: Self-pay | Admitting: Surgery

## 2016-03-29 NOTE — Progress Notes (Signed)
.    HPI:  The patient returns today for followup of an ascending aortic aneurysm. When I saw him last on 03/07/2015 he had lost 50 lbs. Since then he has gained it back. He has not been as active and not watching his diet. He has not had any chest or back pain.  Current Outpatient Prescriptions  Medication Sig Dispense Refill  . acetaminophen (TYLENOL) 500 MG tablet Take 500 mg by mouth every 6 (six) hours as needed (pain).    Marland Kitchen ALPRAZolam (XANAX) 0.5 MG tablet Take 0.5-1 mg by mouth 3 (three) times daily as needed for anxiety or sleep.     Marland Kitchen amiodarone (PACERONE) 200 MG tablet Take 200 mg by mouth daily.  1  . Brimonidine Tartrate (MIRVASO) 0.33 % GEL Apply 1 application topically daily.    . hydrochlorothiazide (MICROZIDE) 12.5 MG capsule Take 1 capsule (12.5 mg total) by mouth every Monday, Wednesday, and Friday. 36 capsule 3  . metoprolol succinate (TOPROL-XL) 100 MG 24 hr tablet TAKE 1 TABLET BY MOUTH TWICE DAILY WITH OR IMMEDIATELY FOLLOWING A MEAL 180 tablet 1  . olmesartan (BENICAR) 40 MG tablet Take 40 mg by mouth daily.    Marland Kitchen OVER THE COUNTER MEDICATION Apply 1 drop to eye daily as needed (for dry eyes). Equate brand of eye lubricant drops    . pantoprazole (PROTONIX) 40 MG tablet Take 40 mg by mouth daily.  0  . potassium chloride SA (K-DUR,KLOR-CON) 20 MEQ tablet Take 1 tablet (20 mEq total) by mouth every Monday, Wednesday, and Friday. Take the same days you take HCTZ 36 tablet 3  . rosuvastatin (CRESTOR) 10 MG tablet Take 10 mg by mouth 3 (three) times a week.     . sertraline (ZOLOFT) 100 MG tablet Take 100 mg by mouth daily.  0  . XARELTO 20 MG TABS tablet Take 1 tablet (20 mg total) by mouth daily. 30 tablet 7  . [DISCONTINUED] famotidine (PEPCID AC) 10 MG chewable tablet Chew 10 mg by mouth as needed.       No current facility-administered medications for this visit.     Physical Exam: BP 142/86 mmHg  Pulse 58  Resp 20  Ht 5\' 9"  (1.753 m)  Wt 266 lb (120.657 kg)   BMI 39.26 kg/m2  SpO2 96% He looks well.  Cardiac exam shows a regular rate and rhythm with normal heart sounds. There is no murmur.  His lung exam is clear.  Diagnostic Tests:  CLINICAL DATA: Thoracic aortic aneurysm without rupture.  EXAM: CT ANGIOGRAPHY CHEST WITH CONTRAST  TECHNIQUE: Multidetector CT imaging of the chest was performed using the standard protocol during bolus administration of intravenous contrast. Multiplanar CT image reconstructions and MIPs were obtained to evaluate the vascular anatomy.  CONTRAST: 75 mL of Isovue 370 intravenously.  COMPARISON: MRI of March 06, 2015.  FINDINGS: No pneumothorax or pleural effusion is noted. 5 mm nodule is noted inferiorly in the lingular segment of the left upper lobe. No other pulmonary disease is noted. Ascending thoracic aortic aneurysm is again noted with maximum measured diameter of 4.8 cm. No dissection is noted. Great vessels are widely patent. Transverse aortic arch measures 3.0 cm in diameter. Descending thoracic aorta measures 2.8 cm in diameter. Visualized portion of upper abdomen is unremarkable. No mediastinal mass or adenopathy is noted. No significant osseous abnormality is noted.  Review of the MIP images confirms the above findings.  IMPRESSION: Stable 4.8 cm ascending thoracic aortic aneurysm is noted. Ascending thoracic  aortic aneurysm. Recommend semi-annual imaging followup by CTA or MRA and referral to cardiothoracic surgery if not already obtained. This recommendation follows 2010 ACCF/AHA/AATS/ACR/ASA/SCA/SCAI/SIR/STS/SVM Guidelines for the Diagnosis and Management of Patients With Thoracic Aortic Disease. Circulation. 2010; 121: e266-e369.  5 mm nodule is noted in the lingular segment of the left upper lobe. No follow-up needed if patient is low-risk. Non-contrast chest CT can be considered in 12 months if patient is high-risk. This recommendation follows the consensus  statement: Guidelines for Management of Incidental Pulmonary Nodules Detected on CT Images:From the Fleischner Society 2017; published online before print (10.1148/radiol.SG:5268862).   Electronically Signed  By: Marijo Conception, M.D.  On: 03/25/2016 14:58   Impression:  The ascending aortic aneurysm is stable at 4.8 mm. His BP is a little elevated today but he says that when he checks it at home it is usually lower. He is on a beta blocker. I stressed the importance of watching his diet closely, getting more physical activity, losing weight and watching his sodium intake.  Plan:  I will see him in one year with a CTA of the chest   Gaye Pollack, MD [facility] [phone]

## 2016-05-01 ENCOUNTER — Encounter: Payer: Self-pay | Admitting: Interventional Cardiology

## 2016-06-21 ENCOUNTER — Other Ambulatory Visit: Payer: Self-pay | Admitting: Interventional Cardiology

## 2016-06-26 LAB — IFOBT (OCCULT BLOOD): IFOBT: NEGATIVE

## 2016-07-09 ENCOUNTER — Other Ambulatory Visit: Payer: Self-pay | Admitting: Interventional Cardiology

## 2016-08-10 ENCOUNTER — Encounter: Payer: Self-pay | Admitting: Interventional Cardiology

## 2016-08-10 ENCOUNTER — Ambulatory Visit (INDEPENDENT_AMBULATORY_CARE_PROVIDER_SITE_OTHER): Payer: Medicare Other | Admitting: Interventional Cardiology

## 2016-08-10 VITALS — BP 130/94 | HR 62 | Ht 69.5 in | Wt 300.0 lb

## 2016-08-10 DIAGNOSIS — Z79899 Other long term (current) drug therapy: Secondary | ICD-10-CM

## 2016-08-10 DIAGNOSIS — I712 Thoracic aortic aneurysm, without rupture, unspecified: Secondary | ICD-10-CM

## 2016-08-10 DIAGNOSIS — I480 Paroxysmal atrial fibrillation: Secondary | ICD-10-CM

## 2016-08-10 DIAGNOSIS — I10 Essential (primary) hypertension: Secondary | ICD-10-CM | POA: Diagnosis not present

## 2016-08-10 DIAGNOSIS — E785 Hyperlipidemia, unspecified: Secondary | ICD-10-CM

## 2016-08-10 DIAGNOSIS — Z7901 Long term (current) use of anticoagulants: Secondary | ICD-10-CM

## 2016-08-10 NOTE — Patient Instructions (Signed)
Medication Instructions:  Your physician recommends that you continue on your current medications as directed. Please refer to the Current Medication list given to you today.   Labwork: Your physician recommends that you return for lab work in: 6 months (Tsh, Hepatic)   Testing/Procedures: None ordered  Follow-Up: Your physician wants you to follow-up in: 6 months with Dr.Smith You will receive a reminder letter in the mail two months in advance. If you don't receive a letter, please call our office to schedule the follow-up appointment.   Any Other Special Instructions Will Be Listed Below (If Applicable).     If you need a refill on your cardiac medications before your next appointment, please call your pharmacy.

## 2016-08-10 NOTE — Progress Notes (Signed)
Cardiology Office Note    Date:  08/10/2016   ID:  Joseph [name], DOB July 27, 1941, MRN [id number]  PCP:  Reginia Naas, MD  Cardiologist: [name], MD   Chief Complaint  Patient presents with  . Atrial Fibrillation    History of Present Illness:  Joseph [name] is a 74 y.o. male for follow-up of paroxysmal atrial fibrillation, history of atrial fibrillation ablation, amiodarone therapy for maintenance of sinus rhythm, chronic anticoagulation therapy, ascending aortic aneurysm measuring 4.8 cm and followed by Dr. Cyndia Bent, obesity, and hypertension.  No symptoms from the cardiac viewpoint. He specifically denies chest discomfort. There is some exertional dyspnea that he feels is related to deconditioning and weight gain. We discussed atrial fibrillation and the current medical regimen. He inquired about being able to come off of amiodarone and Xarelto. He has not noted blood in his urine or stool.  Past Medical History:  Diagnosis Date  . Allergy   . Atrial fibrillation ([place])   . Atrial fibrillation ([place])   . Atypical atrial flutter ([place]) 07/13/2014   Diagnosis on EKG 07/13/14 : Cardioversion August 2015  . Colon polyp   . Depression   . Diverticulosis   . Dysrhythmia   . GERD (gastroesophageal reflux disease)   . Headache(784.0)   . Hyperlipidemia   . Hypertension   . Panic attacks   . PONV (postoperative nausea and vomiting)   . Thoracic aneurysm     Past Surgical History:  Procedure Laterality Date  . ablataion  07/06/2012  . APPENDECTOMY  1958  . CARDIAC CATHETERIZATION  2009  . CARDIOVERSION  03/18/2012   Procedure: CARDIOVERSION;  Surgeon: [name], MD;  Location: [facility];  Service: Cardiovascular;  Laterality: N/A;  . CARDIOVERSION N/A 08/23/2014   Procedure: CARDIOVERSION;  Surgeon: [name], MD;  Location: [facility];  Service: Cardiovascular;  Laterality: N/A;  . INGUINAL HERNIA REPAIR     right  . LUMBAR  LAMINECTOMY/DECOMPRESSION MICRODISCECTOMY Bilateral 02/08/2015   Procedure: Laminectomy and Foraminotomy - bilateral - Lumbar two-three, three-four, left lumbar four-five with resection of synovial cyst ;  Surgeon: Charlie Pitter, MD;  Location: [facility];  Service: Neurosurgery;  Laterality: Bilateral;  . spinal tap    . VASECTOMY      Current Medications: Outpatient Medications Prior to Visit  Medication Sig Dispense Refill  . acetaminophen (TYLENOL) 500 MG tablet Take 500 mg by mouth every 6 (six) hours as needed (pain).    Marland Kitchen ALPRAZolam (XANAX) 0.5 MG tablet Take 0.5-1 mg by mouth 3 (three) times daily as needed for anxiety or sleep.     Marland Kitchen amiodarone (PACERONE) 200 MG tablet Take 200 mg by mouth daily.  1  . hydrochlorothiazide (MICROZIDE) 12.5 MG capsule Take 1 capsule (12.5 mg total) by mouth every Monday, Wednesday, and Friday. 36 capsule 3  . metoprolol succinate (TOPROL-XL) 100 MG 24 hr tablet TAKE 1 TABLET BY MOUTH TWICE DAILY WITH OR IMMEDIATELY FOLLOWING A MEAL 180 tablet 0  . olmesartan (BENICAR) 40 MG tablet Take 40 mg by mouth daily.    . pantoprazole (PROTONIX) 40 MG tablet Take 40 mg by mouth daily.  0  . potassium chloride SA (K-DUR,KLOR-CON) 20 MEQ tablet Take 1 tablet (20 mEq total) by mouth every Monday, Wednesday, and Friday. Take the same days you take HCTZ 36 tablet 3  . rosuvastatin (CRESTOR) 10 MG tablet Take 10 mg by mouth 3 (three) times a week.     Marland Kitchen  sertraline (ZOLOFT) 100 MG tablet Take 100 mg by mouth daily.  0  . XARELTO 20 MG TABS tablet Take 1 tablet (20 mg total) by mouth daily. 30 tablet 7  . Brimonidine Tartrate (MIRVASO) 0.33 % GEL Apply 1 application topically daily.    Marland Kitchen OVER THE COUNTER MEDICATION Apply 1 drop to eye daily as needed (for dry eyes). Equate brand of eye lubricant drops    . XARELTO 20 MG TABS tablet TAKE 1 TABLET BY MOUTH EVERY DAY WITH DINNER (Patient not taking: Reported on 08/10/2016) 90 tablet 1   No facility-administered medications  prior to visit.      Allergies:   Review of patient's allergies indicates no known allergies.   Social History   Social History  . Marital status: Married    Spouse name: Nekia Navejas  . Number of children: 2  . Years of education: College   Occupational History  . Retired    Social History Main Topics  . Smoking status: Never Smoker  . Smokeless tobacco: Never Used  . Alcohol use No  . Drug use: No  . Sexual activity: Not Asked   Other Topics Concern  . None   Social History Narrative   Patient lives at home with spouse.   Caffeine Use: 1 cup daily     Family History:  The patient's family history includes Heart attack in his brother and mother; Heart disease in his father and maternal grandfather; Heart disease (age of onset: 71) in his mother; Kidney disease (age of onset: 28) in his brother; Lung cancer in his father.   ROS:   Please see the history of present illness.    Increase in appetite, weight gain, back discomfort, muscle pain, easy bruising, constipation, anxiety, joint swelling, difficulty with balance, headaches, irregular heartbeat, leg pain, and excessive fatigue.  All other systems reviewed and are negative.   PHYSICAL EXAM:   VS:  BP (!) 130/94   Pulse 62   Ht 5' 9.5" (1.765 m)   Wt 300 lb (136.1 kg)   BMI 43.67 kg/m    GEN: Well nourished, well developed, in no acute distress  HEENT: normal  Neck: no JVD, carotid bruits, or masses Cardiac: RRR; no murmurs, rubs, or gallops,no edema  Respiratory:  clear to auscultation bilaterally, normal work of breathing GI: soft, nontender, nondistended, + BS MS: no deformity or atrophy  Skin: warm and dry, no rash Neuro:  Alert and Oriented x 3, Strength and sensation are intact Psych: euthymic mood, full affect  Wt Readings from Last 3 Encounters:  08/10/16 300 lb (136.1 kg)  03/25/16 266 lb (120.7 kg)  12/10/15 266 lb 12.8 oz (121 kg)      Studies/Labs Reviewed:   EKG:  EKG  Is not  performed.  Recent Labs: 12/10/2015: ALT 17; TSH 2.313 03/23/2016: Creat 1.18   Lipid Panel    Component Value Date/Time   CHOL 150 05/13/2011 0900   TRIG 87.0 05/13/2011 0900   HDL 46.40 05/13/2011 0900   CHOLHDL 3 05/13/2011 0900   VLDL 17.4 05/13/2011 0900   LDLCALC 86 05/13/2011 0900    Additional studies/ records that were reviewed today include:  I reviewed laboratory data obtained from Dr. Carol Ada which revealed an LDL cholesterol of 74 with a total of 147. Running creatinine of 9 and 1.16 respectively. Normal liver tests. Hemoglobin 14.9. These labs were obtained in May 2017. Additionally a TSH was performed but they did not copy of the  report. They tell me it was normal.  Recent chest CT did not reveal any pulmonary abnormality. Ascending aortic aneurysm is 4.8 cm in diameter.  ASSESSMENT:    1. Essential hypertension   2. Paroxysmal atrial fibrillation (HCC)   3. Thoracic aortic aneurysm without rupture ([place])   4. Hyperlipemia   5. On amiodarone therapy   6. Chronic anticoagulation      PLAN:  In order of problems listed above:  1. Blood pressure is well controlled. Low salt diet and weight loss or recommended. 2. Based upon clinical exam, his in normal sinus rhythm. We discussed decreasing amiodarone from 200 to 100 mg per day. We discussed this may slightly increase her risk of recurrent atrial fibrillation and the requirement for cardioversion. After some consideration we decided to remain at the current dose. We will readdress this on the next visit. 3. Being followed by Dr. Cyndia Bent. Most recent determination by ACT was 4.8 cm aortic root enlargement. 4. Lipids noted above were excellent on current therapy. 5. Continue amiodarone 200 mg daily. Consider reduction to 100 mg per day. 6. Continue Xarelto as listed. We again discussed the importance of long-term anticoagulation therapy to prevent stroke.    Medication Adjustments/Labs and Tests  Ordered: Current medicines are reviewed at length with the patient today.  Concerns regarding medicines are outlined above.  Medication changes, Labs and Tests ordered today are listed in the Patient Instructions below. There are no Patient Instructions on file for this visit.   Signed, [name], MD  08/10/2016 2:26 PM    [facility] [place], [place], [place] [facility] [place]  [address] Phone: [phone]; Fax: [phone]

## 2016-09-18 ENCOUNTER — Other Ambulatory Visit: Payer: Self-pay | Admitting: Interventional Cardiology

## 2016-11-01 ENCOUNTER — Other Ambulatory Visit: Payer: Self-pay | Admitting: Interventional Cardiology

## 2017-01-03 ENCOUNTER — Other Ambulatory Visit: Payer: Self-pay | Admitting: Interventional Cardiology

## 2017-01-23 ENCOUNTER — Other Ambulatory Visit: Payer: Self-pay | Admitting: Interventional Cardiology

## 2017-01-23 DIAGNOSIS — I10 Essential (primary) hypertension: Secondary | ICD-10-CM

## 2017-02-17 ENCOUNTER — Other Ambulatory Visit: Payer: Self-pay | Admitting: Surgery

## 2017-02-17 DIAGNOSIS — I712 Thoracic aortic aneurysm, without rupture, unspecified: Secondary | ICD-10-CM

## 2017-02-24 ENCOUNTER — Ambulatory Visit: Payer: Medicare Other | Admitting: Surgery

## 2017-02-25 ENCOUNTER — Encounter: Payer: Self-pay | Admitting: Interventional Cardiology

## 2017-03-03 ENCOUNTER — Ambulatory Visit (INDEPENDENT_AMBULATORY_CARE_PROVIDER_SITE_OTHER): Payer: Medicare Other | Admitting: Interventional Cardiology

## 2017-03-03 ENCOUNTER — Encounter: Payer: Self-pay | Admitting: Interventional Cardiology

## 2017-03-03 VITALS — BP 148/90 | HR 54 | Ht 70.0 in | Wt 313.2 lb

## 2017-03-03 DIAGNOSIS — Z79899 Other long term (current) drug therapy: Secondary | ICD-10-CM | POA: Diagnosis not present

## 2017-03-03 DIAGNOSIS — I480 Paroxysmal atrial fibrillation: Secondary | ICD-10-CM | POA: Diagnosis not present

## 2017-03-03 DIAGNOSIS — I712 Thoracic aortic aneurysm, without rupture, unspecified: Secondary | ICD-10-CM

## 2017-03-03 DIAGNOSIS — I10 Essential (primary) hypertension: Secondary | ICD-10-CM

## 2017-03-03 DIAGNOSIS — E784 Other hyperlipidemia: Secondary | ICD-10-CM

## 2017-03-03 DIAGNOSIS — E7849 Other hyperlipidemia: Secondary | ICD-10-CM

## 2017-03-03 DIAGNOSIS — Z7901 Long term (current) use of anticoagulants: Secondary | ICD-10-CM

## 2017-03-03 MED ORDER — HYDROCHLOROTHIAZIDE 12.5 MG PO CAPS: 12.5000 mg | ORAL_CAPSULE | Freq: Every day | ORAL | 3 refills | Status: DC

## 2017-03-03 MED ORDER — POTASSIUM CHLORIDE CRYS ER 20 MEQ PO TBCR: 20.0000 meq | EXTENDED_RELEASE_TABLET | Freq: Once | ORAL | 3 refills | Status: DC

## 2017-03-03 NOTE — Progress Notes (Signed)
Cardiology Office Note    Date:  03/03/2017   ID:  [name], DOB 03-22-1941, MRN [id number]  PCP:  Reginia Naas, MD  Cardiologist: [name], MD   No chief complaint on file.   History of Present Illness:  Joseph [name] is a 76 y.o. male for follow-up of paroxysmal atrial fibrillation, history of atrial fibrillation ablation, amiodarone therapy for maintenance of sinus rhythm, chronic anticoagulation therapy, ascending aortic aneurysm measuring 4.8 cm and followed by Dr. Cyndia Bent, obesity, and hypertension.  He feels well. He does not want to have additional blood work done here. He brought in evidence of normal liver function in November. TSH was not performed. He will have a chest CT performed in May by Dr. Cyndia Bent and additional blood work will be done by Dr. Carol Ada in May at which time a TSH will also be done. He will send the results. He denies episodes of atrial fibrillation. No episodes of syncope or other complaints.   Past Medical History:  Diagnosis Date  . Allergy   . Atrial fibrillation ([place])   . Atrial fibrillation ([place])   . Atypical atrial flutter ([place]) 07/13/2014   Diagnosis on EKG 07/13/14 : Cardioversion August 2015  . Colon polyp   . Depression   . Diverticulosis   . Dysrhythmia   . GERD (gastroesophageal reflux disease)   . Headache(784.0)   . Hyperlipidemia   . Hypertension   . Panic attacks   . PONV (postoperative nausea and vomiting)   . Thoracic aneurysm     Past Surgical History:  Procedure Laterality Date  . ablataion  07/06/2012  . APPENDECTOMY  1958  . CARDIAC CATHETERIZATION  2009  . CARDIOVERSION  03/18/2012   Procedure: CARDIOVERSION;  Surgeon: [name], MD;  Location: [facility];  Service: Cardiovascular;  Laterality: N/A;  . CARDIOVERSION N/A 08/23/2014   Procedure: CARDIOVERSION;  Surgeon: [name], MD;  Location: [facility];  Service: Cardiovascular;  Laterality: N/A;  . INGUINAL HERNIA REPAIR       right  . LUMBAR LAMINECTOMY/DECOMPRESSION MICRODISCECTOMY Bilateral 02/08/2015   Procedure: Laminectomy and Foraminotomy - bilateral - Lumbar two-three, three-four, left lumbar four-five with resection of synovial cyst ;  Surgeon: Charlie Pitter, MD;  Location: [facility];  Service: Neurosurgery;  Laterality: Bilateral;  . spinal tap    . VASECTOMY      Current Medications: Outpatient Medications Prior to Visit  Medication Sig Dispense Refill  . acetaminophen (TYLENOL) 500 MG tablet Take 500 mg by mouth every 6 (six) hours as needed (pain).    Marland Kitchen ALPRAZolam (XANAX) 0.5 MG tablet Take 0.5-1 mg by mouth 3 (three) times daily as needed for anxiety or sleep.     Marland Kitchen amiodarone (PACERONE) 200 MG tablet TAKE 1 TABLET BY MOUTH TWICE DAILY 180 tablet 3  . metoprolol succinate (TOPROL-XL) 100 MG 24 hr tablet TAKE 1 TABLET BY MOUTH TWICE DAILY WITH OR IMMEDIATELY FOLLOWING A MEAL 180 tablet 3  . olmesartan (BENICAR) 40 MG tablet Take 40 mg by mouth daily.    . pantoprazole (PROTONIX) 40 MG tablet Take 40 mg by mouth daily.  0  . rosuvastatin (CRESTOR) 10 MG tablet Take 10 mg by mouth 3 (three) times a week.     . sertraline (ZOLOFT) 100 MG tablet Take 100 mg by mouth daily.  0  . XARELTO 20 MG TABS tablet TAKE 1 TABLET BY MOUTH EVERY DAY WITH DINNER 90 tablet  1  . hydrochlorothiazide (MICROZIDE) 12.5 MG capsule TAKE 1 CAPSULE BY MOUTH EVERY MONDAY, WEDNESDAY, AND FRIDAY 36 capsule 1  . potassium chloride SA (K-DUR,KLOR-CON) 20 MEQ tablet TAKE 1 TABLET BY MOUTH DAILY ON MONDAY, WEDNESDAY, AND FRIDAY(TAKE ON THE SAME DAYS YOU TAKE HCTZ) 36 tablet 1   No facility-administered medications prior to visit.      Allergies:   Patient has no known allergies.   Social History   Social History  . Marital status: Married    Spouse name: Joseph [name]  . Number of children: 2  . Years of education: College   Occupational History  . Retired    Social History Main Topics  . Smoking status: Never  Smoker  . Smokeless tobacco: Never Used  . Alcohol use No  . Drug use: No  . Sexual activity: Not Asked   Other Topics Concern  . None   Social History Narrative   Patient lives at home with spouse.   Caffeine Use: 1 cup daily     Family History:  The patient's family history includes Heart attack in his brother and mother; Heart disease in his father and maternal grandfather; Heart disease (age of onset: 46) in his mother; Kidney disease (age of onset: 63) in his brother; Lung cancer in his father.   ROS:   Please see the history of present illness.    Musculoskeletal chest pain . Has noted increased blood pressure. He takes diuretics 3 times per week as noted above. All other systems reviewed and are negative.   PHYSICAL EXAM:   VS:  BP (!) 148/90 (BP Location: Left Arm)   Pulse (!) 54   Ht 5\' 10"  (1.778 m)   Wt (!) 313 lb 3.2 oz (142.1 kg)   BMI 44.94 kg/m    GEN: Well nourished, well developed, in no acute distress  HEENT: normal  Neck: no JVD, carotid bruits, or masses Cardiac: RRR; no murmurs, rubs, or gallops,no edema  Respiratory:  clear to auscultation bilaterally, normal work of breathing GI: soft, nontender, nondistended, + BS MS: no deformity or atrophy  Skin: warm and dry, no rash Neuro:  Alert and Oriented x 3, Strength and sensation are intact Psych: euthymic mood, full affect  Wt Readings from Last 3 Encounters:  03/03/17 (!) 313 lb 3.2 oz (142.1 kg)  08/10/16 300 lb (136.1 kg)  03/25/16 266 lb (120.7 kg)      Studies/Labs Reviewed:   EKG:  EKG  Sinus bradycardia at 54 bpm, left axis deviation, incomplete right bundle. Poor wave progression. No significant change compared to prior.  Recent Labs: 03/23/2016: Creat 1.18   Lipid Panel    Component Value Date/Time   CHOL 150 05/13/2011 0900   TRIG 87.0 05/13/2011 0900   HDL 46.40 05/13/2011 0900   CHOLHDL 3 05/13/2011 0900   VLDL 17.4 05/13/2011 0900   LDLCALC 86 05/13/2011 0900     Additional studies/ records that were reviewed today include:  LDL cholesterol 74 in May 2017 liver panel normal in November 2017. Kidney function normal.    ASSESSMENT:    1. Paroxysmal atrial fibrillation (HCC)   2. Essential hypertension   3. Thoracic aortic aneurysm without rupture ([place])   4. Chronic anticoagulation   5. Other hyperlipidemia   6. On amiodarone therapy      PLAN:  In order of problems listed above:  1. Sinus rhythm being maintained with amiodarone. We'll have chest CT, liver panel, and thyroid studies done  by May. Results will be sent to me. 2. Decrease salt in diet. Increase physical activity. Recent HCTZ to 12.5 mg daily. Increase potassium to 20 mEq daily. Clinical follow-up in 6 months. Basic metabolic panel in 2 weeks. 3. Follow-up by Dr. Cyndia Bent in 2 months. 4. On Coumadin therapy without bleeding. 5. Not addressed most recently VLDL was near 70 as noted above. 6. On amiodarone therapy. To avoid multiple blood tests, he is requesting that Dr. Carol Ada checked liver and thyroid studies on her yearly exam. He is having a chest CT done with Dr. Cyndia Bent within the next couple months. I will see him back in 6 months.    Medication Adjustments/Labs and Tests Ordered: Current medicines are reviewed at length with the patient today.  Concerns regarding medicines are outlined above.  Medication changes, Labs and Tests ordered today are listed in the Patient Instructions below. Patient Instructions  Medication Instructions:  1) INCREASE HCTZ to 12.5mg  once daily 2) INCREASE Potassium to 63meq once daily  Labwork: Your physician recommends that you return for lab work in: 2 weeks (BMET)   Testing/Procedures: None  Follow-Up: Your physician wants you to follow-up in: 6 months with Dr. Tamala Julian.  You will receive a reminder letter in the mail two months in advance. If you don't receive a letter, please call our office to schedule the follow-up  appointment.   Any Other Special Instructions Will Be Listed Below (If Applicable).     If you need a refill on your cardiac medications before your next appointment, please call your pharmacy.      Signed, [name], MD  03/03/2017 2:54 PM    [facility] [place], [place], [place]  [address] Phone: [phone]; Fax: [phone]

## 2017-03-03 NOTE — Patient Instructions (Addendum)
Medication Instructions:  1) INCREASE HCTZ to 12.5mg  once daily 2) INCREASE Potassium to 81meq once daily  Labwork: Your physician recommends that you return for lab work in: 2 weeks (BMET)   Testing/Procedures: None  Follow-Up: Your physician wants you to follow-up in: 6 months with Dr. Tamala Julian.  You will receive a reminder letter in the mail two months in advance. If you don't receive a letter, please call our office to schedule the follow-up appointment.   Any Other Special Instructions Will Be Listed Below (If Applicable).     If you need a refill on your cardiac medications before your next appointment, please call your pharmacy.

## 2017-03-24 ENCOUNTER — Encounter: Payer: Self-pay | Admitting: Surgery

## 2017-03-24 ENCOUNTER — Other Ambulatory Visit: Payer: Medicare Other | Admitting: *Deleted

## 2017-03-24 ENCOUNTER — Ambulatory Visit (INDEPENDENT_AMBULATORY_CARE_PROVIDER_SITE_OTHER): Payer: Medicare Other | Admitting: Surgery

## 2017-03-24 ENCOUNTER — Ambulatory Visit
Admission: RE | Admit: 2017-03-24 | Discharge: 2017-03-24 | Disposition: A | Discharge status: Home / Self Care | Payer: Medicare Other | Source: Ambulatory Visit | Attending: Surgery | Admitting: Surgery

## 2017-03-24 VITALS — BP 125/80 | HR 67 | Resp 20 | Ht 70.0 in | Wt 313.0 lb

## 2017-03-24 DIAGNOSIS — I10 Essential (primary) hypertension: Secondary | ICD-10-CM

## 2017-03-24 DIAGNOSIS — I480 Paroxysmal atrial fibrillation: Secondary | ICD-10-CM

## 2017-03-24 DIAGNOSIS — I712 Thoracic aortic aneurysm, without rupture, unspecified: Secondary | ICD-10-CM

## 2017-03-24 DIAGNOSIS — I7121 Aneurysm of the ascending aorta, without rupture: Secondary | ICD-10-CM

## 2017-03-24 LAB — BASIC METABOLIC PANEL
BUN/Creatinine Ratio: 12 (ref 10–24)
BUN: 15 mg/dL (ref 8–27)
CO2: 22 mmol/L (ref 18–29)
Calcium: 9.5 mg/dL (ref 8.6–10.2)
Chloride: 97 mmol/L (ref 96–106)
Creatinine, Ser: 1.27 mg/dL (ref 0.76–1.27)
GFR calc Af Amer: 63 mL/min/{1.73_m2} (ref >59)
GFR calc non Af Amer: 55 mL/min/{1.73_m2} — ABNORMAL LOW (ref >59)
GLUCOSE: 106 mg/dL — AB (ref 65–99)
POTASSIUM: 4.4 mmol/L (ref 3.5–5.2)
SODIUM: 140 mmol/L (ref 134–144)

## 2017-03-24 MED ORDER — IOPAMIDOL (ISOVUE-370) INJECTION 76%
75.0000 mL | Freq: Once | INTRAVENOUS | Status: AC | PRN
  Administered 2017-03-24: 75 mL via INTRAVENOUS

## 2017-03-25 ENCOUNTER — Encounter: Payer: Self-pay | Admitting: Surgery

## 2017-03-25 NOTE — Progress Notes (Signed)
HPI:  The patient returns today for followup of an ascending aortic aneurysm. When I last saw him in 03/2016 it was stable at 4.8 cm. He has not had any chest or back pain. He has gained back his weight and is 313 lbs. He is now in the process of watching his diet and would like to lose a pound a week. He says that he loves to eat and usually things that are bad for you.   Current Outpatient Prescriptions  Medication Sig Dispense Refill  . acetaminophen (TYLENOL) 500 MG tablet Take 500 mg by mouth every 6 (six) hours as needed (pain).    Marland Kitchen ALPRAZolam (XANAX) 0.5 MG tablet Take 0.5-1 mg by mouth 3 (three) times daily as needed for anxiety or sleep.     Marland Kitchen amiodarone (PACERONE) 200 MG tablet TAKE 1 TABLET BY MOUTH TWICE DAILY (Patient taking differently: TAKE 1 TABLET EVERY DAY) 180 tablet 3  . hydrochlorothiazide (MICROZIDE) 12.5 MG capsule Take 1 capsule (12.5 mg total) by mouth daily. 90 capsule 3  . metoprolol succinate (TOPROL-XL) 100 MG 24 hr tablet TAKE 1 TABLET BY MOUTH TWICE DAILY WITH OR IMMEDIATELY FOLLOWING A MEAL (Patient taking differently: TAKES 1 TABLET PER DAY) 180 tablet 3  . olmesartan (BENICAR) 40 MG tablet Take 40 mg by mouth daily.    . pantoprazole (PROTONIX) 40 MG tablet Take 40 mg by mouth daily.  0  . potassium chloride SA (K-DUR,KLOR-CON) 20 MEQ tablet Take 1 tablet (20 mEq total) by mouth once. 90 tablet 3  . rosuvastatin (CRESTOR) 10 MG tablet Take 10 mg by mouth 3 (three) times a week.     . sertraline (ZOLOFT) 100 MG tablet Take 100 mg by mouth daily.  0  . XARELTO 20 MG TABS tablet TAKE 1 TABLET BY MOUTH EVERY DAY WITH DINNER 90 tablet 1   No current facility-administered medications for this visit.      Physical Exam: BP 125/80   Pulse 67   Resp 20   Ht 5\' 10"  (1.778 m)   Wt (!) 313 lb (142 kg)   SpO2 97%   BMI 44.91 kg/m  He looks well but is morbidly obese.  Cardiac exam shows a regular rate and rhythm with normal heart sounds. There is no  murmur.  His lung exam is clear.  Diagnostic Tests:  CLINICAL DATA:  Thoracic aortic aneurysm without rupture.  EXAM: CT ANGIOGRAPHY CHEST WITH CONTRAST  TECHNIQUE: Multidetector CT imaging of the chest was performed using the standard protocol during bolus administration of intravenous contrast. Multiplanar CT image reconstructions and MIPs were obtained to evaluate the vascular anatomy.  CONTRAST:  75 mL of Isovue 370 intravenously.  COMPARISON:  CT scan of March 25, 2016.  FINDINGS: Cardiovascular: Stable 4.8 cm ascending thoracic aortic aneurysm is noted. No dissection is noted. Great vessels are widely patent. Transverse aortic arch measures 2.8 cm. Descending thoracic aorta measures 3.2 cm. Great vessels are widely patent.  Mediastinum/Nodes: No enlarged mediastinal, hilar, or axillary lymph nodes. Thyroid gland, trachea, and esophagus demonstrate no significant findings.  Lungs/Pleura: No pneumothorax or pleural effusion is noted. Stable 4 mm nodule is noted in lingular segment of left upper lobe. No acute pulmonary disease is noted.  Upper Abdomen: No acute abnormality.  Musculoskeletal: No chest wall abnormality. No acute or significant osseous findings.  Review of the MIP images confirms the above findings.  IMPRESSION: Stable 4.8 cm ascending thoracic aortic aneurysm. Recommend semi-annual imaging followup by  CTA or MRA and referral to cardiothoracic surgery if not already obtained. This recommendation follows 2010 ACCF/AHA/AATS/ACR/ASA/SCA/SCAI/SIR/STS/SVM Guidelines for the Diagnosis and Management of Patients With Thoracic Aortic Disease. Circulation. 2010; 121: W737-T062.  Stable 4 mm nodule seen in lingular segment of left upper lobe. This can be considered benign at this point with no further follow-up required.   Electronically Signed   By: Marijo Conception, M.D.   On: 03/24/2017 12:58   Impression:  The ascending aortic  aneurysm is stable at 4.8 mm. His BP is controlled today. He is on a beta blocker. I stressed the importance of watching his diet closely, getting more physical activity, losing weight and watching his sodium intake.  Plan:  I will see him in one year with a CTA of the chest   Gaye Pollack, MD [facility] [phone]

## 2017-07-02 ENCOUNTER — Other Ambulatory Visit: Payer: Self-pay | Admitting: Interventional Cardiology

## 2017-09-08 ENCOUNTER — Other Ambulatory Visit: Payer: Self-pay | Admitting: Interventional Cardiology

## 2017-09-14 NOTE — Progress Notes (Signed)
Cardiology Office Note    Date:  09/15/2017   ID:  Joseph [name], DOB 12/04/1941, MRN [id number]  PCP:  Carol Ada, MD  Cardiologist: [name], MD   Chief Complaint  Patient presents with  . Atrial Fibrillation    History of Present Illness:  Joseph [name] is a 76 y.o. male  for follow-up of paroxysmal atrial fibrillation, history of atrial fibrillation ablation, amiodarone therapy for maintenance of sinus rhythm, chronic anticoagulation therapy, ascending aortic aneurysm measuring 4.8 cm and followed by Dr. Cyndia Bent, obesity, and hypertension.   He is doing well. Not able to exercise because of chronic severe lower back discomfort. Therefore weight continues to gradually increase. He denies prolonged palpitations/atrial fibrillation. Mild lower extremity edema has been noted. He has not had any neurological complaints. No blood in his been noted in his urine or stool. He denies syncope, and head trauma.  Past Medical History:  Diagnosis Date  . Allergy   . Atrial fibrillation ([place])   . Atrial fibrillation ([place])   . Atypical atrial flutter ([place]) 07/13/2014   Diagnosis on EKG 07/13/14 : Cardioversion August 2015  . Colon polyp   . Depression   . Diverticulosis   . Dysrhythmia   . GERD (gastroesophageal reflux disease)   . Headache(784.0)   . Hyperlipidemia   . Hypertension   . Panic attacks   . PONV (postoperative nausea and vomiting)   . Thoracic aneurysm     Past Surgical History:  Procedure Laterality Date  . ablataion  07/06/2012  . APPENDECTOMY  1958  . CARDIAC CATHETERIZATION  2009  . CARDIOVERSION  03/18/2012   Procedure: CARDIOVERSION;  Surgeon: [name], MD;  Location: [facility];  Service: Cardiovascular;  Laterality: N/A;  . CARDIOVERSION N/A 08/23/2014   Procedure: CARDIOVERSION;  Surgeon: [name], MD;  Location: [facility];  Service: Cardiovascular;  Laterality: N/A;  . INGUINAL HERNIA REPAIR     right  . LUMBAR  LAMINECTOMY/DECOMPRESSION MICRODISCECTOMY Bilateral 02/08/2015   Procedure: Laminectomy and Foraminotomy - bilateral - Lumbar two-three, three-four, left lumbar four-five with resection of synovial cyst ;  Surgeon: Charlie Pitter, MD;  Location: [facility];  Service: Neurosurgery;  Laterality: Bilateral;  . spinal tap    . VASECTOMY      Current Medications: Outpatient Medications Prior to Visit  Medication Sig Dispense Refill  . acetaminophen (TYLENOL) 500 MG tablet Take 500 mg by mouth every 6 (six) hours as needed (pain).    Marland Kitchen ALPRAZolam (XANAX) 0.5 MG tablet Take 0.5-1 mg by mouth 3 (three) times daily as needed for anxiety or sleep.     Marland Kitchen amiodarone (PACERONE) 200 MG tablet TAKE 1 TABLET BY MOUTH TWICE DAILY (Patient taking differently: TAKE 1 TABLET EVERY DAY) 180 tablet 3  . hydrochlorothiazide (MICROZIDE) 12.5 MG capsule Take 1 capsule (12.5 mg total) by mouth daily. 90 capsule 3  . metoprolol succinate (TOPROL-XL) 100 MG 24 hr tablet TAKE 1 TABLET BY MOUTH TWICE DAILY WITH OR IMMEDIATELY FOLLOWING A MEAL 180 tablet 0  . olmesartan (BENICAR) 40 MG tablet Take 40 mg by mouth daily.    . pantoprazole (PROTONIX) 40 MG tablet Take 40 mg by mouth daily.  0  . rosuvastatin (CRESTOR) 10 MG tablet Take 10 mg by mouth 3 (three) times a week.     . sertraline (ZOLOFT) 100 MG tablet Take 100 mg by mouth daily.  0  . XARELTO 20 MG TABS tablet TAKE  1 TABLET BY MOUTH EVERY DAY WITH DINNER 90 tablet 1  . potassium chloride SA (K-DUR,KLOR-CON) 20 MEQ tablet Take 1 tablet (20 mEq total) by mouth once. 90 tablet 3   No facility-administered medications prior to visit.      Allergies:   Patient has no known allergies.   Social History   Social History  . Marital status: Married    Spouse name: Zakhari Fogel  . Number of children: 2  . Years of education: College   Occupational History  . Retired    Social History Main Topics  . Smoking status: Never Smoker  . Smokeless tobacco: Never  Used  . Alcohol use No  . Drug use: No  . Sexual activity: Not Asked   Other Topics Concern  . None   Social History Narrative   Patient lives at home with spouse.   Caffeine Use: 1 cup daily     Family History:  The patient's family history includes Heart attack in his brother and mother; Heart disease in his father and maternal grandfather; Heart disease (age of onset: 42) in his mother; Kidney disease (age of onset: 73) in his brother; Lung cancer in his father.   ROS:   Please see the history of present illness.    No significant complaints other than chronic back pain.  All other systems reviewed and are negative.   PHYSICAL EXAM:   VS:  BP 134/86 (BP Location: Left Arm)   Pulse 64   Ht 5\' 10"  (1.778 m)   Wt (!) 311 lb 3.2 oz (141.2 kg)   BMI 44.65 kg/m    GEN: Well nourished, well developed, in no acute distress  HEENT: normal  Neck: no JVD, carotid bruits, or masses Cardiac: RRR; no murmurs, rubs, or gallops,no edema  Respiratory:  clear to auscultation bilaterally, normal work of breathing GI: soft, nontender, nondistended, + BS MS: no deformity or atrophy  Skin: warm and dry, no rash Neuro:  Alert and Oriented x 3, Strength and sensation are intact Psych: euthymic mood, full affect  Wt Readings from Last 3 Encounters:  09/15/17 (!) 311 lb 3.2 oz (141.2 kg)  03/24/17 (!) 313 lb (142 kg)  03/03/17 (!) 313 lb 3.2 oz (142.1 kg)      Studies/Labs Reviewed:   EKG:  EKG  Not performed.  Recent Labs: 03/24/2017: BUN 15; Creatinine, Ser 1.27; Potassium 4.4; Sodium 140   Lipid Panel    Component Value Date/Time   CHOL 150 05/13/2011 0900   TRIG 87.0 05/13/2011 0900   HDL 46.40 05/13/2011 0900   CHOLHDL 3 05/13/2011 0900   VLDL 17.4 05/13/2011 0900   LDLCALC 86 05/13/2011 0900    Additional studies/ records that were reviewed today include:  No recent chest x-rays  CT angios of the chest 03/24/17: IMPRESSION: Stable 4.8 cm ascending thoracic aortic  aneurysm. Recommend semi-annual imaging followup by CTA or MRA and referral to cardiothoracic surgery if not already obtained. This recommendation follows 2010 ACCF/AHA/AATS/ACR/ASA/SCA/SCAI/SIR/STS/SVM Guidelines for the Diagnosis and Management of Patients With Thoracic Aortic Disease. Circulation. 2010; 121: P295-J884.  Stable 4 mm nodule seen in lingular segment of left upper lobe. This can be considered benign at this point with no further follow-up required.   ASSESSMENT:    1. Paroxysmal atrial fibrillation (HCC)   2. Atypical atrial flutter (HCC)   3. Chronic anticoagulation   4. On amiodarone therapy   5. Other hyperlipidemia      PLAN:  In order  of problems listed above:  1. Rhythm is controlled on the current medical regimen. 2. No recurrence of atrial fibrillation or flutter on current medical regimen. 3. Chronic anticoagulation therapy with Xarelto and no reported bleeding complications. Hemoglobin recently performed was 15.3. BUN and creatinine were stable at 13 and 1.3 respectively. 4. Liver panel and thyroid or stable. Recent laboratory data performed in May 2018 by Dr. Carol Ada document stable labs. TSH was 2.46 and transaminases were normal. A chest CT performed in March did not reveal any evidence of pulmonary parenchymal disease. 5. Lipid panel is excellent with LDL 97.  The patient needs to have PA and lateral chest x-ray done. Follow-up in 6 months with a TSH and liver panel. No change in current therapy.  Medication Adjustments/Labs and Tests Ordered: Current medicines are reviewed at length with the patient today.  Concerns regarding medicines are outlined above.  Medication changes, Labs and Tests ordered today are listed in the Patient Instructions below. There are no Patient Instructions on file for this visit.   Signed, [name], MD  09/15/2017 1:38 PM    [facility] [place], [place], [place]   [address] Phone: [phone]; Fax: [phone]

## 2017-09-15 ENCOUNTER — Encounter: Payer: Self-pay | Admitting: Interventional Cardiology

## 2017-09-15 ENCOUNTER — Ambulatory Visit (INDEPENDENT_AMBULATORY_CARE_PROVIDER_SITE_OTHER): Payer: Medicare Other | Admitting: Interventional Cardiology

## 2017-09-15 VITALS — BP 134/86 | HR 64 | Ht 70.0 in | Wt 311.2 lb

## 2017-09-15 DIAGNOSIS — Z7901 Long term (current) use of anticoagulants: Secondary | ICD-10-CM | POA: Diagnosis not present

## 2017-09-15 DIAGNOSIS — E784 Other hyperlipidemia: Secondary | ICD-10-CM

## 2017-09-15 DIAGNOSIS — I484 Atypical atrial flutter: Secondary | ICD-10-CM | POA: Diagnosis not present

## 2017-09-15 DIAGNOSIS — I480 Paroxysmal atrial fibrillation: Secondary | ICD-10-CM | POA: Diagnosis not present

## 2017-09-15 DIAGNOSIS — Z79899 Other long term (current) drug therapy: Secondary | ICD-10-CM | POA: Diagnosis not present

## 2017-09-15 DIAGNOSIS — E7849 Other hyperlipidemia: Secondary | ICD-10-CM

## 2017-09-15 NOTE — Patient Instructions (Signed)
Medication Instructions:  Your physician recommends that you continue on your current medications as directed. Please refer to the Current Medication list given to you today.   Labwork: None  Testing/Procedures: A chest x-ray takes a picture of the organs and structures inside the chest, including the heart, lungs, and blood vessels. This test can show several things, including, whether the heart is enlarges; whether fluid is building up in the lungs; and whether pacemaker / defibrillator leads are still in place.   Follow-Up: Your physician wants you to follow-up in: 6 months with Dr. Tamala Julian.  You will receive a reminder letter in the mail two months in advance. If you don't receive a letter, please call our office to schedule the follow-up appointment.   Any Other Special Instructions Will Be Listed Below (If Applicable).     If you need a refill on your cardiac medications before your next appointment, please call your pharmacy.

## 2017-12-06 ENCOUNTER — Other Ambulatory Visit: Payer: Self-pay | Admitting: Interventional Cardiology

## 2017-12-07 MED ORDER — METOPROLOL SUCCINATE ER 100 MG PO TB24: ORAL_TABLET | ORAL | 3 refills | Status: DC

## 2017-12-07 NOTE — Addendum Note (Signed)
Addended by: Merlene Laughter on: 12/07/2017 11:20 AM   Modules accepted: Orders

## 2017-12-24 ENCOUNTER — Other Ambulatory Visit: Payer: Self-pay | Admitting: Interventional Cardiology

## 2017-12-24 NOTE — Telephone Encounter (Addendum)
Xarelto 20mg  refill request received; Pt is 76 yrs old, Crea-1.3 via [facility] on 04/2017, last seen by Dr. Tamala Julian on 08/2018, Wt-141.2kg CrCl-97.ml/min. Refill request sent

## 2017-12-30 ENCOUNTER — Other Ambulatory Visit: Payer: Self-pay | Admitting: Interventional Cardiology

## 2018-02-08 ENCOUNTER — Other Ambulatory Visit: Payer: Self-pay | Admitting: *Deleted

## 2018-02-08 DIAGNOSIS — I712 Thoracic aortic aneurysm, without rupture, unspecified: Secondary | ICD-10-CM

## 2018-02-08 NOTE — Progress Notes (Unsigned)
cta

## 2018-02-11 ENCOUNTER — Other Ambulatory Visit: Payer: Self-pay | Admitting: Interventional Cardiology

## 2018-03-23 ENCOUNTER — Ambulatory Visit: Payer: Medicare Other | Admitting: Surgery

## 2018-03-23 ENCOUNTER — Ambulatory Visit
Admission: RE | Admit: 2018-03-23 | Discharge: 2018-03-23 | Disposition: A | Discharge status: Home / Self Care | Payer: Medicare Other | Source: Ambulatory Visit | Attending: Thoracic Surgery (Cardiothoracic Vascular Surgery) | Admitting: Thoracic Surgery (Cardiothoracic Vascular Surgery)

## 2018-03-23 ENCOUNTER — Other Ambulatory Visit: Payer: Self-pay

## 2018-03-23 ENCOUNTER — Encounter: Payer: Self-pay | Admitting: Surgery

## 2018-03-23 VITALS — BP 121/79 | HR 64 | Resp 16 | Ht 70.0 in | Wt 319.0 lb

## 2018-03-23 DIAGNOSIS — I712 Thoracic aortic aneurysm, without rupture, unspecified: Secondary | ICD-10-CM

## 2018-03-23 MED ORDER — IOPAMIDOL (ISOVUE-370) INJECTION 76%
75.0000 mL | Freq: Once | INTRAVENOUS | Status: AC | PRN
  Administered 2018-03-23: 75 mL via INTRAVENOUS

## 2018-03-23 NOTE — Progress Notes (Signed)
HPI:  The patient returns today for followup of an ascending aortic aneurysm.  When I saw him last year it was stable at 4.8 cm.  He has been doing well over the past year but has gained some more weight.  He was 313 pounds last year and is now 319 pounds.  He said that he has recently started watching his diet and is trying to lose some weight.  He has significant degenerative spine disease which limits his ambulation.  He also likes to eat a lot.  He denies any chest pain or shortness of breath.  Current Outpatient Medications  Medication Sig Dispense Refill  . acetaminophen (TYLENOL) 500 MG tablet Take 500 mg by mouth every 6 (six) hours as needed (pain).    Marland Kitchen ALPRAZolam (XANAX) 0.5 MG tablet Take 0.5-1 mg by mouth 3 (three) times daily as needed for anxiety or sleep.     Marland Kitchen amiodarone (PACERONE) 200 MG tablet TAKE 1 TABLET BY MOUTH TWICE DAILY 180 tablet 2  . doxycycline (VIBRA-TABS) 100 MG tablet Take 100 mg by mouth 2 (two) times daily.  2  . hydrochlorothiazide (MICROZIDE) 12.5 MG capsule TAKE 1 CAPSULE(12.5 MG) BY MOUTH DAILY 90 capsule 1  . olmesartan (BENICAR) 40 MG tablet Take 40 mg by mouth daily.    . pantoprazole (PROTONIX) 40 MG tablet Take 40 mg by mouth daily.  0  . potassium chloride SA (K-DUR,KLOR-CON) 20 MEQ tablet Take 20 mEq by mouth daily.    . rosuvastatin (CRESTOR) 10 MG tablet Take 10 mg by mouth. TAKE FIVE TIMES WEEKLY    . sertraline (ZOLOFT) 100 MG tablet Take 100 mg by mouth daily.  0  . XARELTO 20 MG TABS tablet TAKE 1 TABLET BY MOUTH EVERY DAY WITH DINNER 90 tablet 2  . metoprolol succinate (TOPROL-XL) 100 MG 24 hr tablet TAKE 1 TABLET BY MOUTH TWICE DAILY WITH OR IMMEDIATELY FOLLOWING A MEAL 180 tablet 3   No current facility-administered medications for this visit.      Physical Exam: BP 121/79 (BP Location: Right Arm, Patient Position: Sitting, Cuff Size: Large)   Pulse 64   Resp 16   Ht 5\' 10"  (1.778 m)   Wt (!) 319 lb (144.7 kg)   SpO2 97%  Comment: ON RA  BMI 45.77 kg/m  He looks well but is morbidly obese.  Cardiac exam shows a regular rate and rhythm with normal heart sounds. There is no murmur.  His lung exam is clear.    Diagnostic Tests:  CLINICAL DATA:  Thoracic aortic aneurysm follow-up.  EXAM: CT ANGIOGRAPHY CHEST WITH CONTRAST  TECHNIQUE: Multidetector CT imaging of the chest was performed using the standard protocol during bolus administration of intravenous contrast. Multiplanar CT image reconstructions and MIPs were obtained to evaluate the vascular anatomy.  CONTRAST:  88mL ISOVUE-370 IOPAMIDOL (ISOVUE-370) INJECTION 76%  Creatinine was obtained on site at [facility] at [address]  Results: Creatinine 1.2 mg/dL.  COMPARISON:  CTA chest dated March 24, 2017.  FINDINGS: Cardiovascular: Stable 4.8 cm ascending thoracic aortic aneurysm. No dissection. Normal heart size. No pericardial effusion. No central pulmonary embolism.  Mediastinum/Nodes: No enlarged mediastinal, hilar, or axillary lymph nodes. Thyroid gland, trachea, and esophagus demonstrate no significant findings.  Lungs/Pleura: Lungs are clear. No pleural effusion or pneumothorax. Stable 4 mm nodule in the lingula.  Upper Abdomen: No acute abnormality.  Musculoskeletal: No chest wall abnormality. No acute or significant osseous findings.  Review of the  MIP images confirms the above findings.  IMPRESSION: 1. Stable 4.8 cm ascending thoracic aortic aneurysm. Recommend semi-annual imaging followup by CTA or MRA and referral to cardiothoracic surgery if not already obtained. This recommendation follows 2010 ACCF/AHA/AATS/ACR/ASA/SCA/SCAI/SIR/STS/SVM Guidelines for the Diagnosis and Management of Patients With Thoracic Aortic Disease. Circulation. 2010; 121: Y606-Y045   Electronically Signed   By: Titus Dubin M.D.   On: 03/23/2018 12:30   Impression:  He has a stable 4.8 cm fusiform  ascending aortic aneurysm.  There is no evidence of aortic dissection.  His blood pressure is under adequate control and he is on a beta-blocker.  I reviewed the CT images with him his wife today and answered their questions.  I encouraged him to watch his diet closely, increase his activity as much as possible and try to lose weight.  He said that he would like to be down to around 250 pounds by next year if possible.  Plan:  I will see him back in 1 year with a CTA of the chest.   I spent 15 minutes performing this established patient evaluation and > 50% of this time was spent face to face counseling and coordinating the care of this patient's aortic aneurysm.    Gaye Pollack, MD [facility] [phone]

## 2018-05-27 ENCOUNTER — Ambulatory Visit: Payer: Medicare Other | Admitting: Interventional Cardiology

## 2018-05-27 ENCOUNTER — Encounter: Payer: Self-pay | Admitting: Interventional Cardiology

## 2018-05-27 VITALS — BP 136/90 | HR 57 | Ht 69.0 in | Wt 322.0 lb

## 2018-05-27 DIAGNOSIS — I480 Paroxysmal atrial fibrillation: Secondary | ICD-10-CM | POA: Diagnosis not present

## 2018-05-27 DIAGNOSIS — I10 Essential (primary) hypertension: Secondary | ICD-10-CM | POA: Diagnosis not present

## 2018-05-27 DIAGNOSIS — Z79899 Other long term (current) drug therapy: Secondary | ICD-10-CM | POA: Diagnosis not present

## 2018-05-27 DIAGNOSIS — I712 Thoracic aortic aneurysm, without rupture, unspecified: Secondary | ICD-10-CM

## 2018-05-27 DIAGNOSIS — E7849 Other hyperlipidemia: Secondary | ICD-10-CM

## 2018-05-27 DIAGNOSIS — Z7901 Long term (current) use of anticoagulants: Secondary | ICD-10-CM

## 2018-05-27 NOTE — Progress Notes (Signed)
Cardiology Office Note    Date:  05/27/2018   ID:  LANDRUM CARBONELL, DOB 1941/05/26, MRN [id number]  PCP:  Carol Ada, MD  Cardiologist: [name], MD   Chief Complaint  Patient presents with  . Atrial Fibrillation  . Congestive Heart Failure  . Thoracic Aortic Aneurysm    History of Present Illness:  Joseph [name] is a 77 y.o. male for follow-up of paroxysmal atrial fibrillation, history of atrial fibrillation ablation, amiodarone therapy for maintenance of sinus rhythm, chronic anticoagulation therapy, ascending aortic aneurysm measuring 4.8 cm and followed by Dr. Cyndia Bent, obesity, and hypertension.  He is asymptomatic.  Denies palpitations or episodes of atrial fib, and has had no anginal complaints or syncope.  He is not exercising on any regular basis.  He denies neurological symptoms.  He has not had blood in his urine or stool.  Past Medical History:  Diagnosis Date  . Allergy   . Atrial fibrillation ([place])   . Atrial fibrillation ([place])   . Atypical atrial flutter ([place]) 07/13/2014   Diagnosis on EKG 07/13/14 : Cardioversion August 2015  . Colon polyp   . Depression   . Diverticulosis   . Dysrhythmia   . GERD (gastroesophageal reflux disease)   . Headache(784.0)   . Hyperlipidemia   . Hypertension   . Panic attacks   . PONV (postoperative nausea and vomiting)   . Thoracic aneurysm     Past Surgical History:  Procedure Laterality Date  . ablataion  07/06/2012  . APPENDECTOMY  1958  . CARDIAC CATHETERIZATION  2009  . CARDIOVERSION  03/18/2012   Procedure: CARDIOVERSION;  Surgeon: [name], MD;  Location: [facility];  Service: Cardiovascular;  Laterality: N/A;  . CARDIOVERSION N/A 08/23/2014   Procedure: CARDIOVERSION;  Surgeon: [name], MD;  Location: [facility];  Service: Cardiovascular;  Laterality: N/A;  . INGUINAL HERNIA REPAIR     right  . LUMBAR LAMINECTOMY/DECOMPRESSION MICRODISCECTOMY Bilateral 02/08/2015   Procedure:  Laminectomy and Foraminotomy - bilateral - Lumbar two-three, three-four, left lumbar four-five with resection of synovial cyst ;  Surgeon: Charlie Pitter, MD;  Location: [facility];  Service: Neurosurgery;  Laterality: Bilateral;  . spinal tap    . VASECTOMY      Current Medications: Outpatient Medications Prior to Visit  Medication Sig Dispense Refill  . acetaminophen (TYLENOL) 500 MG tablet Take 500 mg by mouth every 6 (six) hours as needed (pain).    Marland Kitchen ALPRAZolam (XANAX) 0.5 MG tablet Take 0.5-1 mg by mouth 3 (three) times daily as needed for anxiety or sleep.     Marland Kitchen amiodarone (PACERONE) 200 MG tablet Take 200 mg by mouth daily.    Marland Kitchen doxycycline (VIBRA-TABS) 100 MG tablet Take 100 mg by mouth 2 (two) times daily.  2  . hydrochlorothiazide (MICROZIDE) 12.5 MG capsule TAKE 1 CAPSULE(12.5 MG) BY MOUTH DAILY 90 capsule 1  . metoprolol succinate (TOPROL-XL) 100 MG 24 hr tablet Take 100 mg by mouth daily. Take with or immediately following a meal.    . olmesartan (BENICAR) 40 MG tablet Take 40 mg by mouth daily.    . pantoprazole (PROTONIX) 40 MG tablet Take 40 mg by mouth daily.  0  . potassium chloride SA (K-DUR,KLOR-CON) 20 MEQ tablet Take 20 mEq by mouth daily.    . rosuvastatin (CRESTOR) 10 MG tablet Take 10 mg by mouth. TAKE FIVE TIMES WEEKLY    . sertraline (ZOLOFT) 100 MG tablet Take  100 mg by mouth daily.  0  . XARELTO 20 MG TABS tablet TAKE 1 TABLET BY MOUTH EVERY DAY WITH DINNER 90 tablet 2  . amiodarone (PACERONE) 200 MG tablet TAKE 1 TABLET BY MOUTH TWICE DAILY (Patient not taking: Reported on 05/27/2018) 180 tablet 2  . metoprolol succinate (TOPROL-XL) 100 MG 24 hr tablet TAKE 1 TABLET BY MOUTH TWICE DAILY WITH OR IMMEDIATELY FOLLOWING A MEAL (Patient not taking: Reported on 05/27/2018) 180 tablet 3   No facility-administered medications prior to visit.      Allergies:   Patient has no known allergies.   Social History   Socioeconomic History  . Marital status: Married     Spouse name: Joseph [name]  . Number of children: 2  . Years of education: College  . Highest education level: Not on file  Occupational History  . Occupation: Retired  Scientific laboratory technician  . Financial resource strain: Not on file  . Food insecurity:    Worry: Not on file    Inability: Not on file  . Transportation needs:    Medical: Not on file    Non-medical: Not on file  Tobacco Use  . Smoking status: Never Smoker  . Smokeless tobacco: Never Used  Substance and Sexual Activity  . Alcohol use: No  . Drug use: No  . Sexual activity: Not on file  Lifestyle  . Physical activity:    Days per week: Not on file    Minutes per session: Not on file  . Stress: Not on file  Relationships  . Social connections:    Talks on phone: Not on file    Gets together: Not on file    Attends religious service: Not on file    Active member of club or organization: Not on file    Attends meetings of clubs or organizations: Not on file    Relationship status: Not on file  Other Topics Concern  . Not on file  Social History Narrative   Patient lives at home with spouse.   Caffeine Use: 1 cup daily     Family History:  The patient's family history includes Heart attack in his brother and mother; Heart disease in his father and maternal grandfather; Heart disease (age of onset: 57) in his mother; Kidney disease (age of onset: 56) in his brother; Lung cancer in his father.   ROS:   Please see the history of present illness.     His complaints are numerous including easy bruising, dyspnea on exertion, occasional abdominal pain, left upper back discomfort after a fall, leg swelling,.   All other systems reviewed and are negative.   PHYSICAL EXAM:   VS:  BP 136/90   Pulse (!) 57   Ht 5\' 9"  (1.753 m)   Wt (!) 322 lb (146.1 kg)   BMI 47.55 kg/m    GEN: Well nourished, well developed, in no acute distress.  Obese. HEENT: normal  Neck: no JVD, carotid bruits, or masses Cardiac: RRR; no  murmurs, rubs, or gallops.  1-2+ bilateral edema. Respiratory:  clear to auscultation bilaterally, normal work of breathing GI: soft, nontender, nondistended, + BS MS: no deformity or atrophy  Skin: warm and dry, no rash Neuro:  Alert and Oriented x 3, Strength and sensation are intact Psych: euthymic mood, full affect  Wt Readings from Last 3 Encounters:  05/27/18 (!) 322 lb (146.1 kg)  03/23/18 (!) 319 lb (144.7 kg)  09/15/17 (!) 311 lb 3.2 oz (141.2 kg)  Studies/Labs Reviewed:   EKG:  EKG sinus bradycardia, first-degree AV block, left axis deviation, no change when compared to prior.  Recent Labs: No results found for requested labs within last 8760 hours.   Lipid Panel    Component Value Date/Time   CHOL 150 05/13/2011 0900   TRIG 87.0 05/13/2011 0900   HDL 46.40 05/13/2011 0900   CHOLHDL 3 05/13/2011 0900   VLDL 17.4 05/13/2011 0900   LDLCALC 86 05/13/2011 0900    Additional studies/ records that were reviewed today include:  No new imaging data    ASSESSMENT:    1. On amiodarone therapy   2. Essential hypertension   3. Paroxysmal atrial fibrillation (HCC)   4. Other hyperlipidemia   5. Chronic anticoagulation   6. Thoracic aortic aneurysm without rupture ([place])      PLAN:  In order of problems listed above:  1. No evidence of amiodarone toxicity.  TSH will be obtained today.  Liver test done 3 weeks ago was normal.  TSH and liver panel in 6 months. 2. Blood pressure is reasonably controlled although diastolic is at upper limit of acceptable.  Low-salt diet recommended.  Weight loss encouraged. 3. Currently in sinus rhythm.  Continue amiodarone.  Continue anticoagulation. 4. Not addressed. 5. Continue Xarelto.  Monitor for blood in urine and stool. 6. Stable thoracic aortic aneurysm at 4.8 cm.  Being followed by Dr. Mohammed Kindle.  Continue risk factor modification including weight loss, physical activity, and low-salt diet.  Clinical follow-up in 6  months.  Medication Adjustments/Labs and Tests Ordered: Current medicines are reviewed at length with the patient today.  Concerns regarding medicines are outlined above.  Medication changes, Labs and Tests ordered today are listed in the Patient Instructions below. Patient Instructions  Medication Instructions:  No changes  Labwork: TSH today  Testing/Procedures: None Ordered  Follow-Up: Your physician wants you to follow-up in: 6 months with Dr. Tamala Julian. You will receive a reminder letter in the mail two months in advance. If you don't receive a letter, please call our office to schedule the follow-up appointment.   Any Other Special Instructions Will Be Listed Below (If Applicable).     If you need a refill on your cardiac medications before your next appointment, please call your pharmacy.     Signed, [name], MD  05/27/2018 5:28 PM    [facility] [place], [place], [place]  [address] Phone: [phone]; Fax: [phone]

## 2018-05-27 NOTE — Patient Instructions (Signed)
Medication Instructions:  No changes  Labwork: TSH today  Testing/Procedures: None Ordered  Follow-Up: Your physician wants you to follow-up in: 6 months with Dr. Tamala Julian. You will receive a reminder letter in the mail two months in advance. If you don't receive a letter, please call our office to schedule the follow-up appointment.   Any Other Special Instructions Will Be Listed Below (If Applicable).     If you need a refill on your cardiac medications before your next appointment, please call your pharmacy.

## 2018-05-28 LAB — TSH: TSH: 3.21 u[IU]/mL (ref 0.450–4.500)

## 2018-08-06 ENCOUNTER — Other Ambulatory Visit: Payer: Self-pay | Admitting: Interventional Cardiology

## 2018-09-12 ENCOUNTER — Other Ambulatory Visit: Payer: Self-pay | Admitting: Interventional Cardiology

## 2018-09-12 NOTE — Telephone Encounter (Signed)
Pt last saw Dr Tamala Julian 05/27/18, last labs 05/16/18 Creat 1.35 at [facility], age 77, weight 146.1kg, CrCl 94.69, based on CrCl pt is on appropriate dosage of Xarelto 20mg  QD.  Will refill rx.

## 2018-09-19 ENCOUNTER — Other Ambulatory Visit: Payer: Self-pay | Admitting: Interventional Cardiology

## 2018-11-09 LAB — GLUCOSE, POCT (MANUAL RESULT ENTRY): POC GLUCOSE: 109 mg/dL — AB (ref 70–99)

## 2018-11-27 NOTE — Progress Notes (Signed)
Cardiology Office Note:    Date:  11/28/2018   ID:  Joseph [name], DOB Oct 31, 1941, MRN [id number]  PCP:  Carol Ada, MD  Cardiologist:  [name], MD   Referring MD: Carol Ada, MD   Chief Complaint  Patient presents with  . Atrial Fibrillation  . Congestive Heart Failure  . Obesity    History of Present Illness:    Joseph [name] is a 77 y.o. male with a hx of paroxysmal atrial fibrillation, history of atrial fibrillation ablation, amiodarone therapy for maintenance of sinus rhythm, chronic anticoagulation therapy, ascending aortic aneurysm measuring 4.8 cm and followed by Dr. Cyndia Bent, obesity, and hypertension.  [name] is an active.  This is predominantly because of his back.  We discussed exercise metrics to maintain cardiovascular health, 150 minutes of moderate activity per week.  He falls for short of this.  States that he can walk around for approximately 10 minutes before his back begins to hurt severely.  Does have some dyspnea on exertion but cannot figure out if it is due to deconditioning or cardiac issues.  He denies orthopnea.  He does have left greater than right lower extremity edema.  No episodes of irregularity in heart rhythm or symptoms similar to atrial fibrillation in the past.  He continues on amiodarone without obvious side effects.  Past Medical History:  Diagnosis Date  . Allergy   . Atrial fibrillation ([place])   . Atrial fibrillation ([place])   . Atypical atrial flutter ([place]) 07/13/2014   Diagnosis on EKG 07/13/14 : Cardioversion August 2015  . Colon polyp   . Depression   . Diverticulosis   . Dysrhythmia   . GERD (gastroesophageal reflux disease)   . Headache(784.0)   . Hyperlipidemia   . Hypertension   . Panic attacks   . PONV (postoperative nausea and vomiting)   . Thoracic aneurysm     Past Surgical History:  Procedure Laterality Date  . ablataion  07/06/2012  . APPENDECTOMY  1958  . CARDIAC CATHETERIZATION  2009  .  CARDIOVERSION  03/18/2012   Procedure: CARDIOVERSION;  Surgeon: [name], MD;  Location: [facility];  Service: Cardiovascular;  Laterality: N/A;  . CARDIOVERSION N/A 08/23/2014   Procedure: CARDIOVERSION;  Surgeon: [name], MD;  Location: [place];  Service: Cardiovascular;  Laterality: N/A;  . INGUINAL HERNIA REPAIR     right  . LUMBAR LAMINECTOMY/DECOMPRESSION MICRODISCECTOMY Bilateral 02/08/2015   Procedure: Laminectomy and Foraminotomy - bilateral - Lumbar two-three, three-four, left lumbar four-five with resection of synovial cyst ;  Surgeon: Charlie Pitter, MD;  Location: [place] [facility];  Service: Neurosurgery;  Laterality: Bilateral;  . spinal tap    . VASECTOMY      Current Medications: Current Meds  Medication Sig  . acetaminophen (TYLENOL) 500 MG tablet Take 500 mg by mouth every 6 (six) hours as needed (pain).  Marland Kitchen ALPRAZolam (XANAX) 0.5 MG tablet Take 0.5-1 mg by mouth 3 (three) times daily as needed for anxiety or sleep.   Marland Kitchen amiodarone (PACERONE) 200 MG tablet Take 1 tablet (200 mg total) by mouth daily.  Marland Kitchen doxycycline (VIBRA-TABS) 100 MG tablet Take 100 mg by mouth 2 (two) times daily.  . hydrochlorothiazide (MICROZIDE) 12.5 MG capsule TAKE 1 CAPSULE(12.5 MG) BY MOUTH DAILY  . metoprolol succinate (TOPROL-XL) 100 MG 24 hr tablet Take 100 mg by mouth daily. Take with or immediately following a meal.  . olmesartan (BENICAR) 40 MG tablet Take 40  mg by mouth daily.  . pantoprazole (PROTONIX) 40 MG tablet Take 40 mg by mouth daily.  . potassium chloride SA (K-DUR,KLOR-CON) 20 MEQ tablet Take 20 mEq by mouth daily.  . rosuvastatin (CRESTOR) 10 MG tablet Take 10 mg by mouth. TAKE FIVE TIMES WEEKLY  . sertraline (ZOLOFT) 100 MG tablet Take 100 mg by mouth daily.  Alveda Reasons 20 MG TABS tablet TAKE 1 TABLET BY MOUTH EVERY DAY WITH DINNER  . [DISCONTINUED] famotidine (PEPCID AC) 10 MG chewable tablet Chew 10 mg by mouth as needed.       Allergies:   Patient has no known  allergies.   Social History   Socioeconomic History  . Marital status: Married    Spouse name: Joseph [name]  . Number of children: 2  . Years of education: College  . Highest education level: Not on file  Occupational History  . Occupation: Retired  Scientific laboratory technician  . Financial resource strain: Not on file  . Food insecurity:    Worry: Not on file    Inability: Not on file  . Transportation needs:    Medical: Not on file    Non-medical: Not on file  Tobacco Use  . Smoking status: Never Smoker  . Smokeless tobacco: Never Used  Substance and Sexual Activity  . Alcohol use: No  . Drug use: No  . Sexual activity: Not on file  Lifestyle  . Physical activity:    Days per week: Not on file    Minutes per session: Not on file  . Stress: Not on file  Relationships  . Social connections:    Talks on phone: Not on file    Gets together: Not on file    Attends religious service: Not on file    Active member of club or organization: Not on file    Attends meetings of clubs or organizations: Not on file    Relationship status: Not on file  Other Topics Concern  . Not on file  Social History Narrative   Patient lives at home with spouse.   Caffeine Use: 1 cup daily     Family History: The patient's family history includes Heart attack in his brother and mother; Heart disease in his father and maternal grandfather; Heart disease (age of onset: 32) in his mother; Kidney disease (age of onset: 74) in his brother; Lung cancer in his father.  ROS:   Please see the history of present illness.    Chronic low back discomfort.  Unable to control his appetite.  All other systems reviewed and are negative.  EKGs/Labs/Other Studies Reviewed:    The following studies were reviewed today: Laboratory data performed at [facility] by Dr. Carol Ada most recently November 17, 2018 demonstrated a creatinine of 1.3, potassium of 4.9, TSH 4.3, and PSA of 1.77.  LDL cholesterol 83 liver panel  normal with ALT of 15.  EKG:  EKG is not performed today.  When last performed in May 2019, first-degree AV block and left anterior hemiblock were noted.  Recent Labs: 05/27/2018: TSH 3.210  Recent Lipid Panel    Component Value Date/Time   CHOL 150 05/13/2011 0900   TRIG 87.0 05/13/2011 0900   HDL 46.40 05/13/2011 0900   CHOLHDL 3 05/13/2011 0900   VLDL 17.4 05/13/2011 0900   LDLCALC 86 05/13/2011 0900    Physical Exam:    VS:  BP 112/74   Pulse 72   Ht 5\' 9"  (1.753 m)   Wt Marland Kitchen)  327 lb (148.3 kg)   SpO2 97%   BMI 48.29 kg/m     Wt Readings from Last 3 Encounters:  11/28/18 (!) 327 lb (148.3 kg)  05/27/18 (!) 322 lb (146.1 kg)  03/23/18 (!) 319 lb (144.7 kg)     GEN: Morbidly obese.. No acute distress HEENT: Normal NECK: No JVD. LYMPHATICS: No lymphadenopathy CARDIAC: RRR.  No murmur, gallop, or edema VASCULAR: Pulses 2+ bilateral radial, Bruits absent in the carotids. RESPIRATORY:  Clear to auscultation without rales, wheezing or rhonchi  ABDOMEN: Soft, non-tender, non-distended, No pulsatile mass, MUSCULOSKELETAL: No deformity  SKIN: Warm and dry NEUROLOGIC:  Alert and oriented x 3 PSYCHIATRIC:  Normal affect   ASSESSMENT:    1. On amiodarone therapy   2. Paroxysmal atrial fibrillation ([facility])   3. Essential hypertension   4. Thoracic aortic aneurysm without rupture ([place])   5. Chronic anticoagulation   6. Atypical atrial flutter ([facility])   7. Other hyperlipidemia    PLAN:    In order of problems listed above:  1. Decrease amiodarone to 100 mg/day.  He has not had atrial fibrillation in over 3-1/2 years.  He is forewarned that this may allow atrial fibrillation to recur.  Plan clinical observation over the next 6 months.  EKG on next office visit. 2. Suppression of atrial fibrillation with amiodarone 200 mg daily.  Dose of amnio is being decreased. 3. Excellent blood pressure control.  Target 130/80 mmHg.  Low-salt diet. 4. Aortic aneurysm 4.8 mm in diameter  slightly less than 1 year ago.  Upcoming appointment with Dr. Cyndia Bent for surveillance.  Continue moderate intensity beta-blocker therapy.  Aorta has been stable over several years. 5. Continue Xarelto which is causing no bleeding complications.  Current dose is appropriate given creatinine of 1.3. 6. Not addressed 7. LDL is less than or equal to 100 on low intensity Crestor 10 mg/day.  This is being followed by primary care, Dr. Carol Ada.  I sincerely encouraged the patient concerning secondary risk prevention.  Covered items include the following:  Overall education and awareness concerning primary/secondary risk prevention was discussed in detail: LDL less than 70, hemoglobin A1c less than 7, blood pressure target less than 130/80 mmHg, >150 minutes of moderate aerobic activity per week, avoidance of smoking, weight control (via diet and exercise), and continued surveillance/management of/for obstructive sleep apnea.  Greater than 50% of the time during this office visit was spent in education, counseling, and coordination of care related to underlying disease process and testing as outlined.   Explained follow-up with EKG on return    Medication Adjustments/Labs and Tests Ordered: Current medicines are reviewed at length with the patient today.  Concerns regarding medicines are outlined above.  No orders of the defined types were placed in this encounter.  No orders of the defined types were placed in this encounter.   There are no Patient Instructions on file for this visit.   Signed, [name], MD  11/28/2018 2:06 PM    [facility]

## 2018-11-28 ENCOUNTER — Encounter: Payer: Self-pay | Admitting: Interventional Cardiology

## 2018-11-28 ENCOUNTER — Ambulatory Visit: Payer: Medicare Other | Admitting: Interventional Cardiology

## 2018-11-28 VITALS — BP 112/74 | HR 72 | Ht 69.0 in | Wt 327.0 lb

## 2018-11-28 DIAGNOSIS — E7849 Other hyperlipidemia: Secondary | ICD-10-CM

## 2018-11-28 DIAGNOSIS — I10 Essential (primary) hypertension: Secondary | ICD-10-CM | POA: Diagnosis not present

## 2018-11-28 DIAGNOSIS — Z79899 Other long term (current) drug therapy: Secondary | ICD-10-CM | POA: Diagnosis not present

## 2018-11-28 DIAGNOSIS — I712 Thoracic aortic aneurysm, without rupture, unspecified: Secondary | ICD-10-CM

## 2018-11-28 DIAGNOSIS — Z7901 Long term (current) use of anticoagulants: Secondary | ICD-10-CM

## 2018-11-28 DIAGNOSIS — I480 Paroxysmal atrial fibrillation: Secondary | ICD-10-CM

## 2018-11-28 DIAGNOSIS — I484 Atypical atrial flutter: Secondary | ICD-10-CM

## 2018-11-28 MED ORDER — AMIODARONE HCL 200 MG PO TABS: 100.0000 mg | ORAL_TABLET | Freq: Every day | ORAL | 3 refills | Status: DC

## 2018-11-28 NOTE — Patient Instructions (Addendum)
Medication Instructions:  Your physician has recommended you make the following change in your medication:  1.) decrease amiodarone to 100 mg once a day   If you need a refill on your cardiac medications before your next appointment, please call your pharmacy.   Lab work: none If you have labs (blood work) drawn today and your tests are completely normal, you will receive your results only by: Marland Kitchen MyChart Message (if you have MyChart) OR . A paper copy in the mail If you have any lab test that is abnormal or we need to change your treatment, we will call you to review the results.  Testing/Procedures: none  Follow-Up: At [facility], you and your health needs are our priority.  As part of our continuing mission to provide you with exceptional heart care, we have created designated Provider Care Teams.  These Care Teams include your primary Cardiologist (physician) and Advanced Practice Providers (APPs -  Physician Assistants and Nurse Practitioners) who all work together to provide you with the care you need, when you need it. You will need a follow up appointment in 6 months.  Please call our office 2 months in advance to schedule this appointment.  You may see [name], MD or one of the following Advanced Practice Providers on your designated Care Team:   Truitt Merle, NP Cecilie Kicks, NP . Kathyrn Drown, NP  Any Other Special Instructions Will Be Listed Below (If Applicable).

## 2019-01-27 ENCOUNTER — Other Ambulatory Visit: Payer: Self-pay | Admitting: Surgery

## 2019-01-27 DIAGNOSIS — I712 Thoracic aortic aneurysm, without rupture, unspecified: Secondary | ICD-10-CM

## 2019-03-22 ENCOUNTER — Other Ambulatory Visit: Payer: Medicare Other

## 2019-03-22 ENCOUNTER — Ambulatory Visit: Payer: Medicare Other | Admitting: Surgery

## 2019-04-22 ENCOUNTER — Other Ambulatory Visit: Payer: Self-pay | Admitting: Interventional Cardiology

## 2019-05-28 ENCOUNTER — Other Ambulatory Visit: Payer: Self-pay | Admitting: Interventional Cardiology

## 2019-09-05 NOTE — Progress Notes (Addendum)
Cardiology Office Note:    Date:  09/06/2019   ID:  Joseph [name], DOB 1941-04-03, MRN [id number]  PCP:  Carol Ada, MD  Cardiologist:  [name], MD   Referring MD: Carol Ada, MD   Chief Complaint  Patient presents with  . Atrial Fibrillation  . Congestive Heart Failure  . Thoracic Aortic Aneurysm    History of Present Illness:    Joseph [name] is a 78 y.o. male with a hx of paroxysmal atrial fibrillation, history of atrial fibrillation ablation, amiodarone therapy for maintenance of sinus rhythm, chronic anticoagulation therapy, ascending aortic aneurysm measuring 4.8 cm and followed by Dr. Cyndia Bent, obesity, and hypertension.  No cardiac complaints.  Some irregularity in heartbeat early in the morning.  No definite episodes of atrial fibrillation that he can tell.  He is now on amiodarone 100 mg/day.  He has had no bleeding on Xarelto.  No episodes of syncope or angina.  Some lower extremity swelling.  He is eating out most meals as a mechanism to get out of his house.  In turn, he has gained weight.  Past Medical History:  Diagnosis Date  . Allergy   . Atrial fibrillation ([place])   . Atrial fibrillation ([place])   . Atypical atrial flutter ([place]) 07/13/2014   Diagnosis on EKG 07/13/14 : Cardioversion August 2015  . Colon polyp   . Depression   . Diverticulosis   . Dysrhythmia   . GERD (gastroesophageal reflux disease)   . Headache(784.0)   . Hyperlipidemia   . Hypertension   . Panic attacks   . PONV (postoperative nausea and vomiting)   . Thoracic aneurysm     Past Surgical History:  Procedure Laterality Date  . ablataion  07/06/2012  . APPENDECTOMY  1958  . CARDIAC CATHETERIZATION  2009  . CARDIOVERSION  03/18/2012   Procedure: CARDIOVERSION;  Surgeon: [name], MD;  Location: [place];  Service: Cardiovascular;  Laterality: N/A;  . CARDIOVERSION N/A 08/23/2014   Procedure: CARDIOVERSION;  Surgeon: [name], MD;  Location: [place];   Service: Cardiovascular;  Laterality: N/A;  . INGUINAL HERNIA REPAIR     right  . LUMBAR LAMINECTOMY/DECOMPRESSION MICRODISCECTOMY Bilateral 02/08/2015   Procedure: Laminectomy and Foraminotomy - bilateral - Lumbar two-three, three-four, left lumbar four-five with resection of synovial cyst ;  Surgeon: Charlie Pitter, MD;  Location: [place] [facility];  Service: Neurosurgery;  Laterality: Bilateral;  . spinal tap    . VASECTOMY      Current Medications: Current Meds  Medication Sig  . acetaminophen (TYLENOL) 500 MG tablet Take 500 mg by mouth every 6 (six) hours as needed (pain).  Marland Kitchen ALPRAZolam (XANAX) 0.5 MG tablet Take 0.5-1 mg by mouth 3 (three) times daily as needed for anxiety or sleep.   Marland Kitchen amiodarone (PACERONE) 200 MG tablet Take 0.5 tablets (100 mg total) by mouth daily.  . Azelaic Acid 15 % cream APPLY TO FACE ONCE TO TWICE A DAY AS NEEDED FOR ROSACEA.  Marland Kitchen doxycycline (VIBRA-TABS) 100 MG tablet Take 100 mg by mouth 2 (two) times daily.  . hydrochlorothiazide (MICROZIDE) 12.5 MG capsule TAKE 1 CAPSULE(12.5 MG) BY MOUTH DAILY  . metoprolol succinate (TOPROL-XL) 100 MG 24 hr tablet Take 100 mg by mouth daily. Take with or immediately following a meal.  . olmesartan (BENICAR) 40 MG tablet Take 40 mg by mouth daily.  . pantoprazole (PROTONIX) 40 MG tablet Take 40 mg by mouth daily.  . potassium  chloride SA (K-DUR,KLOR-CON) 20 MEQ tablet Take 20 mEq by mouth daily.  . rosuvastatin (CRESTOR) 10 MG tablet Take 10 mg by mouth. TAKE FIVE TIMES WEEKLY  . sertraline (ZOLOFT) 100 MG tablet Take 100 mg by mouth daily.  Alveda Reasons 20 MG TABS tablet TAKE 1 TABLET BY MOUTH EVERY DAY WITH DINNER     Allergies:   Patient has no known allergies.   Social History   Socioeconomic History  . Marital status: Married    Spouse name: Jammar Omans  . Number of children: 2  . Years of education: College  . Highest education level: Not on file  Occupational History  . Occupation: Retired  Scientific laboratory technician  .  Financial resource strain: Not on file  . Food insecurity    Worry: Not on file    Inability: Not on file  . Transportation needs    Medical: Not on file    Non-medical: Not on file  Tobacco Use  . Smoking status: Never Smoker  . Smokeless tobacco: Never Used  Substance and Sexual Activity  . Alcohol use: No  . Drug use: No  . Sexual activity: Not on file  Lifestyle  . Physical activity    Days per week: Not on file    Minutes per session: Not on file  . Stress: Not on file  Relationships  . Social Herbalist on phone: Not on file    Gets together: Not on file    Attends religious service: Not on file    Active member of club or organization: Not on file    Attends meetings of clubs or organizations: Not on file    Relationship status: Not on file  Other Topics Concern  . Not on file  Social History Narrative   Patient lives at home with spouse.   Caffeine Use: 1 cup daily     Family History: The patient's family history includes Heart attack in his brother and mother; Heart disease in his father and maternal grandfather; Heart disease (age of onset: 29) in his mother; Kidney disease (age of onset: 39) in his brother; Lung cancer in his father.  ROS:   Please see the history of present illness.    Depression, weight gain, poor intake of heart healthy food.  All other systems reviewed and are negative.  EKGs/Labs/Other Studies Reviewed:    The following studies were reviewed today: No recent CT performed dating back to March 2019.  EKG:  EKG normal sinus rhythm, incomplete right bundle, left axis deviation, poor R wave progression.  When compared to prior tracings most recently in May 2019, no changes noted.  Recent Labs: No results found for requested labs within last 8760 hours.  Recent Lipid Panel    Component Value Date/Time   CHOL 150 05/13/2011 0900   TRIG 87.0 05/13/2011 0900   HDL 46.40 05/13/2011 0900   CHOLHDL 3 05/13/2011 0900   VLDL 17.4  05/13/2011 0900   LDLCALC 86 05/13/2011 0900    Physical Exam:    VS:  BP (!) 142/78   Pulse 65   Ht 5\' 9"  (1.753 m)   Wt (!) 342 lb 6.4 oz (155.3 kg)   SpO2 96%   BMI 50.56 kg/m     Wt Readings from Last 3 Encounters:  09/06/19 (!) 342 lb 6.4 oz (155.3 kg)  11/28/18 (!) 327 lb (148.3 kg)  05/27/18 (!) 322 lb (146.1 kg)     GEN: Morbid obesity.  No acute distress HEENT: Normal NECK: No JVD. LYMPHATICS: No lymphadenopathy CARDIAC:  RRR without murmur, gallop, or edema. VASCULAR:  Normal Pulses. No bruits. RESPIRATORY:  Clear to auscultation without rales, wheezing or rhonchi  ABDOMEN: Soft, non-tender, non-distended, No pulsatile mass, MUSCULOSKELETAL: No deformity  SKIN: Warm and dry NEUROLOGIC:  Alert and oriented x 3 PSYCHIATRIC:  Normal affect   ASSESSMENT:    1. Paroxysmal atrial fibrillation (HCC)   2. Essential hypertension   3. Chronic anticoagulation   4. On amiodarone therapy   5. Thoracic aortic aneurysm without rupture ([place])   6. Other hyperlipidemia   7. Educated About Covid-19 Virus Infection    PLAN:    In order of problems listed above:  1. Low-dose suppressive amiodarone is maintaining sinus rhythm.  TSH in July was within normal limits as was liver panel. 2. Borderline blood pressure on the systolic side.  Decrease salt and weight. 3. No bleeding on Xarelto. 4. TSH and liver panel in 6 months. 5. CT Angio of the chest to reassess aortic size. 6. LDL is not at target.  Increase rosuvastatin to 20 mg/day.  Liver and lipid panel in 6 weeks. 7. Social distancing, handwashing, and masking is stressed.   Medication Adjustments/Labs and Tests Ordered: Current medicines are reviewed at length with the patient today.  Concerns regarding medicines are outlined above.  No orders of the defined types were placed in this encounter.  No orders of the defined types were placed in this encounter.   There are no Patient Instructions on file for this  visit.   Signed, [name], MD  09/06/2019 8:29 AM    [facility]

## 2019-09-06 ENCOUNTER — Other Ambulatory Visit: Payer: Self-pay

## 2019-09-06 ENCOUNTER — Ambulatory Visit (INDEPENDENT_AMBULATORY_CARE_PROVIDER_SITE_OTHER): Payer: Medicare Other | Admitting: Interventional Cardiology

## 2019-09-06 ENCOUNTER — Encounter: Payer: Self-pay | Admitting: Interventional Cardiology

## 2019-09-06 VITALS — BP 142/78 | HR 65 | Ht 69.0 in | Wt 342.4 lb

## 2019-09-06 DIAGNOSIS — I480 Paroxysmal atrial fibrillation: Secondary | ICD-10-CM

## 2019-09-06 DIAGNOSIS — Z79899 Other long term (current) drug therapy: Secondary | ICD-10-CM | POA: Diagnosis not present

## 2019-09-06 DIAGNOSIS — Z7189 Other specified counseling: Secondary | ICD-10-CM

## 2019-09-06 DIAGNOSIS — I10 Essential (primary) hypertension: Secondary | ICD-10-CM

## 2019-09-06 DIAGNOSIS — Z7901 Long term (current) use of anticoagulants: Secondary | ICD-10-CM

## 2019-09-06 DIAGNOSIS — I712 Thoracic aortic aneurysm, without rupture, unspecified: Secondary | ICD-10-CM

## 2019-09-06 DIAGNOSIS — E7849 Other hyperlipidemia: Secondary | ICD-10-CM

## 2019-09-06 MED ORDER — ROSUVASTATIN CALCIUM 20 MG PO TABS: 20.0000 mg | ORAL_TABLET | Freq: Every day | ORAL | 3 refills | Status: DC

## 2019-09-06 NOTE — Patient Instructions (Signed)
Medication Instructions:  1) INCREASE Rosuvastatin to 20mg  once daily  If you need a refill on your cardiac medications before your next appointment, please call your pharmacy.   Lab work: Your physician recommends that you return for lab work in: 8 weeks (BMET, Liver, Lipid)  If you have labs (blood work) drawn today and your tests are completely normal, you will receive your results only by: Marland Kitchen MyChart Message (if you have MyChart) OR . A paper copy in the mail If you have any lab test that is abnormal or we need to change your treatment, we will call you to review the results.  Testing/Procedures: Your physician recommends that you have a CT Angio of the Aorta between now and March.   Follow-Up: At [facility], you and your health needs are our priority.  As part of our continuing mission to provide you with exceptional heart care, we have created designated Provider Care Teams.  These Care Teams include your primary Cardiologist (physician) and Advanced Practice Providers (APPs -  Physician Assistants and Nurse Practitioners) who all work together to provide you with the care you need, when you need it. You will need a follow up appointment in 6 months.  Please call our office 2 months in advance to schedule this appointment.  You may see [name], MD or one of the following Advanced Practice Providers on your designated Care Team:   Truitt Merle, NP Cecilie Kicks, NP . Kathyrn Drown, NP  Any Other Special Instructions Will Be Listed Below (If Applicable).  Your physician recommends that you increase your activity and decrease carbohydrates in your diet.

## 2019-11-01 ENCOUNTER — Other Ambulatory Visit: Payer: Medicare Other | Admitting: *Deleted

## 2019-11-01 ENCOUNTER — Other Ambulatory Visit: Payer: Self-pay

## 2019-11-01 DIAGNOSIS — E7849 Other hyperlipidemia: Secondary | ICD-10-CM

## 2019-11-01 DIAGNOSIS — I10 Essential (primary) hypertension: Secondary | ICD-10-CM

## 2019-11-01 DIAGNOSIS — I480 Paroxysmal atrial fibrillation: Secondary | ICD-10-CM

## 2019-11-01 LAB — BASIC METABOLIC PANEL
BUN/Creatinine Ratio: 10 (ref 10–24)
BUN: 12 mg/dL (ref 8–27)
CO2: 25 mmol/L (ref 20–29)
Calcium: 9.8 mg/dL (ref 8.6–10.2)
Chloride: 97 mmol/L (ref 96–106)
Creatinine, Ser: 1.26 mg/dL (ref 0.76–1.27)
GFR calc Af Amer: 63 mL/min/{1.73_m2} (ref >59)
GFR calc non Af Amer: 54 mL/min/{1.73_m2} — ABNORMAL LOW (ref >59)
Glucose: 108 mg/dL — ABNORMAL HIGH (ref 65–99)
Potassium: 4.6 mmol/L (ref 3.5–5.2)
Sodium: 136 mmol/L (ref 134–144)

## 2019-11-01 LAB — HEPATIC FUNCTION PANEL
ALT: 20 IU/L (ref 0–44)
AST: 21 IU/L (ref 0–40)
Albumin: 4.7 g/dL (ref 3.7–4.7)
Alkaline Phosphatase: 66 IU/L (ref 39–117)
Bilirubin Total: 0.4 mg/dL (ref 0.0–1.2)
Bilirubin, Direct: 0.12 mg/dL (ref 0.00–0.40)
Total Protein: 6.9 g/dL (ref 6.0–8.5)

## 2019-11-01 LAB — LIPID PANEL
Chol/HDL Ratio: 2.7 ratio (ref 0.0–5.0)
Cholesterol, Total: 151 mg/dL (ref 100–199)
HDL: 55 mg/dL (ref >39)
LDL Chol Calc (NIH): 67 mg/dL (ref 0–99)
Triglycerides: 171 mg/dL — ABNORMAL HIGH (ref 0–149)
VLDL Cholesterol Cal: 29 mg/dL (ref 5–40)

## 2019-11-03 ENCOUNTER — Telehealth: Payer: Self-pay | Admitting: *Deleted

## 2019-11-03 NOTE — Telephone Encounter (Signed)
Result Notes for Lipid panel  Notes recorded by Michae Kava, CMA on 11/03/2019 at 3:45 PM EST  RE: CT question  Received: Today  Message Contents  Snead, Deatra Ina, NT sent to Michae Kava, CMA      [name]   I called patient spoke with [name] advised that CT Angio Chest Aorta will not image entire Abdomen. Patient feels a lump in abdomen area and is concerned he has a cancer or Abdominal aneurysm. Advised patient to call back speak with Dr Tamala Julian nurse or call Nicoletta Ba at [facility] for their recommendations.   Thank you   Erline Levine    ------   Notes recorded by Michae Kava, CMA on 11/03/2019 at 3:04 PM EST  DPR ok to s/w pt's wife who has been notified of pt's lab results. She did have a question about the CT Wed if it scan the pt's stomach area as well. I stated I will have the CT dept call her as I just am not quite sure how far down they scan. Pt's wife thanked me for the call and the help. The patient has been notified of the result and verbalized understanding. All questions (if any) were answered.  Julaine Hua, [facility] 11/03/2019 3:04 PM

## 2019-11-05 NOTE — Telephone Encounter (Signed)
Speak to PCP.

## 2019-11-06 NOTE — Telephone Encounter (Signed)
Spoke with wife and made her aware to reach out to PCP.  Wife verbalized understanding.

## 2019-11-08 ENCOUNTER — Ambulatory Visit (INDEPENDENT_AMBULATORY_CARE_PROVIDER_SITE_OTHER)
Admission: RE | Admit: 2019-11-08 | Discharge: 2019-11-08 | Disposition: A | Discharge status: Home / Self Care | Payer: Medicare Other | Source: Ambulatory Visit | Attending: Interventional Cardiology | Admitting: Interventional Cardiology

## 2019-11-08 ENCOUNTER — Encounter: Payer: Self-pay | Admitting: Physician Assistant

## 2019-11-08 ENCOUNTER — Other Ambulatory Visit: Payer: Self-pay

## 2019-11-08 DIAGNOSIS — I712 Thoracic aortic aneurysm, without rupture, unspecified: Secondary | ICD-10-CM

## 2019-11-08 MED ORDER — IOHEXOL 350 MG/ML SOLN
100.0000 mL | Freq: Once | INTRAVENOUS | Status: AC | PRN
  Administered 2019-11-08: 100 mL via INTRAVENOUS

## 2019-11-29 ENCOUNTER — Ambulatory Visit: Payer: Medicare Other | Admitting: Physician Assistant

## 2019-11-29 ENCOUNTER — Encounter: Payer: Self-pay | Admitting: Physician Assistant

## 2019-11-29 VITALS — HR 81 | Temp 97.4°F | Ht 69.0 in | Wt 342.0 lb

## 2019-11-29 DIAGNOSIS — K439 Ventral hernia without obstruction or gangrene: Secondary | ICD-10-CM | POA: Diagnosis not present

## 2019-11-29 DIAGNOSIS — M6208 Separation of muscle (nontraumatic), other site: Secondary | ICD-10-CM | POA: Diagnosis not present

## 2019-11-29 DIAGNOSIS — R1010 Upper abdominal pain, unspecified: Secondary | ICD-10-CM | POA: Diagnosis not present

## 2019-11-29 NOTE — Progress Notes (Signed)
Reviewed and agree with management plan.  Malcolm T. Stark, MD FACG [facility]  

## 2019-11-29 NOTE — Patient Instructions (Signed)
If you are age 78 or older, your body mass index should be between 23-30. Your Body mass index is 50.5 kg/m. If this is out of the aforementioned range listed, please consider follow up with your Primary Care Provider.  If you are age 28 or younger, your body mass index should be between 19-25. Your Body mass index is 50.5 kg/m. If this is out of the aformentioned range listed, please consider follow up with your Primary Care Provider.   You have been scheduled for a CT scan of the abdomen and pelvis at [facility] ([address]---this is in the same building as [employer]).   You are scheduled on 12/06/19 at 3 pm. You should arrive 15 minutes prior to your appointment time for registration. Please follow the written instructions below on the day of your exam:  WARNING: IF YOU ARE ALLERGIC TO IODINE/X-RAY DYE, PLEASE NOTIFY RADIOLOGY IMMEDIATELY AT [phone]! YOU WILL BE GIVEN A 13 HOUR PREMEDICATION PREP.  1) Do not eat or drink anything after 11 am (4 hours prior to your test) 2) You have been given 2 bottles of oral contrast to drink. The solution may taste better if refrigerated, but do NOT add ice or any other liquid to this solution. Shake well before drinking.    Drink 1 bottle of contrast @ 1 pm (2 hours prior to your exam)  Drink 1 bottle of contrast @ 2 pm (1 hour prior to your exam)  You may take any medications as prescribed with a small amount of water, if necessary. If you take any of the following medications: METFORMIN, GLUCOPHAGE, GLUCOVANCE, AVANDAMET, RIOMET, FORTAMET, [place] MET, JANUMET, GLUMETZA or METAGLIP, you MAY be asked to HOLD this medication 48 hours AFTER the exam.  The purpose of you drinking the oral contrast is to aid in the visualization of your intestinal tract. The contrast solution may cause some diarrhea. Depending on your individual set of symptoms, you may also receive an intravenous injection of x-ray contrast/dye. Plan on  being at [facility] for 30 minutes or longer, depending on the type of exam you are having performed.  This test typically takes 30-45 minutes to complete.  If you have any questions regarding your exam or if you need to reschedule, you may call the CT department at [phone] between the hours of 8:00 am and 5:00 pm, Monday-Friday.  ______________________________________________________________  We will call you with results.  Thank you for choosing me and [facility].   Amy Esterwood, PA-C

## 2019-11-29 NOTE — Progress Notes (Signed)
Subjective:    Patient ID: Joseph [name], male    DOB: 02/25/1941, 78 y.o.   MRN: [id number]  HPI Joseph [name] is a pleasant 78 year old white male, known previously to Dr. Deatra Ina and last seen here in 2015 by Dr. Deatra Ina and myself.  He comes in today with concerns about a new abdominal wall hernia.  Patient has history of atrial fib/flutter, chronic GERD, history of colon polyps and a thoracic aortic aneurysm which has been followed. Patient just underwent a follow-up CT angio of the chest on 11/08/2019 which showed persistent aneurysmal dilation of the ascending thoracic aorta measuring 4.8 cm and unchanged.  Noted a small hiatal hernia, 1.2 cm hypodense left thyroid nodule and diverticulosis of the colon.  There was no comment about a ventral hernia or diastases. Patient says his symptoms started acutely on November 4 after he was straining for a bowel movement.  He says he noticed a protrusion in his upper abdomen about the size of a football which was firm but not painful or tender.  He says ever since then when he is lying on his back and tenses his abdominal muscles this protrusion is very evident.  He says much less noticeable to him when he is standing.  He is done some reading and is concerned about a ventral hernia and complications.  He would like this further evaluated. Had any recent changes in bowel habits, no melena or hematochezia.  No current complaints of GERD, on pantoprazole 40 mg p.o. daily. Last EGD was done in 2015 per Dr. Deatra Ina with Venia Minks dilation of distal esophageal stricture.  Colonoscopy in 2012 showed moderate diverticulosis no polyps.  He has remote history of an adenomatous polyp.  He was recommended for 10-year interval follow-up  Review of Systems Pertinent positive and negative review of systems were noted in the above HPI section.  All other review of systems was otherwise negative.  Outpatient Encounter Medications as of 11/29/2019  Medication Sig  . acetaminophen  (TYLENOL) 500 MG tablet Take 500 mg by mouth every 6 (six) hours as needed (pain).  Marland Kitchen ALPRAZolam (XANAX) 0.5 MG tablet Take 0.5-1 mg by mouth 3 (three) times daily as needed for anxiety or sleep.   Marland Kitchen amiodarone (PACERONE) 200 MG tablet Take 0.5 tablets (100 mg total) by mouth daily.  . Azelaic Acid 15 % cream APPLY TO FACE ONCE TO TWICE A DAY AS NEEDED FOR ROSACEA.  Marland Kitchen doxycycline (VIBRA-TABS) 100 MG tablet Take 100 mg by mouth 2 (two) times daily.  . hydrochlorothiazide (MICROZIDE) 12.5 MG capsule TAKE 1 CAPSULE(12.5 MG) BY MOUTH DAILY  . metoprolol succinate (TOPROL-XL) 100 MG 24 hr tablet Take 100 mg by mouth daily. Take with or immediately following a meal.  . olmesartan (BENICAR) 40 MG tablet Take 40 mg by mouth daily.  . pantoprazole (PROTONIX) 40 MG tablet Take 40 mg by mouth daily.  . potassium chloride SA (K-DUR,KLOR-CON) 20 MEQ tablet Take 20 mEq by mouth daily.  . rosuvastatin (CRESTOR) 20 MG tablet Take 1 tablet (20 mg total) by mouth daily.  . sertraline (ZOLOFT) 100 MG tablet Take 100 mg by mouth daily.  Alveda Reasons 20 MG TABS tablet TAKE 1 TABLET BY MOUTH EVERY DAY WITH DINNER  . [DISCONTINUED] famotidine (PEPCID AC) 10 MG chewable tablet Chew 10 mg by mouth as needed.     No facility-administered encounter medications on file as of 11/29/2019.    No Known Allergies Patient Active Problem List   Diagnosis Date Noted  .  Spinal stenosis, lumbar region, with neurogenic claudication 02/08/2015  . Lumbar stenosis with neurogenic claudication 02/08/2015  . On amiodarone therapy 07/17/2014  . Atypical atrial flutter ([place]) 07/13/2014  . Hypokalemia 10/04/2013  . Personal history of colonic polyps 05/28/2011  . Chronic anticoagulation 05/28/2011  . Atrial fibrillation ([place]) 05/13/2011  . Hypertension 02/12/2011  . Aneurysm of thoracic aorta ([place]) 02/12/2011  . Hyperlipemia 02/12/2011  . GERD (gastroesophageal reflux disease) 02/12/2011   Social History   Socioeconomic History  .  Marital status: Married    Spouse name: Joseph [name]  . Number of children: 2  . Years of education: College  . Highest education level: Not on file  Occupational History  . Occupation: Retired  Scientific laboratory technician  . Financial resource strain: Not on file  . Food insecurity    Worry: Not on file    Inability: Not on file  . Transportation needs    Medical: Not on file    Non-medical: Not on file  Tobacco Use  . Smoking status: Never Smoker  . Smokeless tobacco: Never Used  Substance and Sexual Activity  . Alcohol use: No  . Drug use: No  . Sexual activity: Not on file  Lifestyle  . Physical activity    Days per week: Not on file    Minutes per session: Not on file  . Stress: Not on file  Relationships  . Social Herbalist on phone: Not on file    Gets together: Not on file    Attends religious service: Not on file    Active member of club or organization: Not on file    Attends meetings of clubs or organizations: Not on file    Relationship status: Not on file  . Intimate partner violence    Fear of current or ex partner: Not on file    Emotionally abused: Not on file    Physically abused: Not on file    Forced sexual activity: Not on file  Other Topics Concern  . Not on file  Social History Narrative   Patient lives at home with spouse.   Caffeine Use: 1 cup daily    Joseph [name] family history includes Heart attack in his brother and mother; Heart disease in his father and maternal grandfather; Heart disease (age of onset: 90) in his mother; Kidney disease (age of onset: 18) in his brother; Lung cancer in his father.      Objective:    Vitals:   11/29/19 0853  Pulse: 81  Temp: (!) 97.4 F (36.3 C)    Physical Exam Well-developed well-nourished elderly white male in no acute distress.  Height, Weight, 342 BMI 50.5  HEENT; nontraumatic normocephalic, EOMI, PER R LA, sclera anicteric. Oropharynx; not examined/mask/Covid Neck; supple, no JVD  Cardiovascular; regular rate and rhythm with S1-S2, no murmur rub or gallop Pulmonary; Clear bilaterally Abdomen; soft, morbidly obese nontender, nondistended, no palpable mass or hepatosplenomegaly, bowel sounds are active.  With patient standing there is no palpable abdominal wall hernia or protrusion in the upper abdomen, in supine position no definite palpable ventral hernia midline, with going from lying to sitting position patient has a large ovoid protrusion in the upper abdomen midline, no palpable hernia defect, nontender Rectal; not done Skin; benign exam, no jaundice rash or appreciable lesions Extremities; no clubbing cyanosis or edema skin warm and dry Neuro/Psych; alert and oriented x4, grossly nonfocal mood and affect appropriate       Assessment &  Plan:   #45 78 year old white male with 1 month history of new upper abdominal midline protrusion, onset after straining for bowel movement. On exam I suspect this is a diastases recti, rule out large ventral hernia midline.  #2 chronic GERD stable #3 colon cancer surveillance-negative colonoscopy 2012, remote history of adenomatous polyp.  Recommended for 10-year interval follow-up. #4 morbid obesity #5.  Chronic anticoagulation-on Xarelto #6.  Atrial fib flutter #7 stable ascending thoracic aortic aneurysm 4.8 cm  Plan; Will schedule for CT scan of the abdomen and pelvis with contrast. Further plans pending results of CT. Patient will be due for follow-up colonoscopy 2022. Patient will be established with Dr. Fuller Plan. Joseph Mcglory Genia Harold PA-C 11/29/2019   Cc: Carol Ada, MD

## 2019-12-06 ENCOUNTER — Telehealth: Payer: Self-pay | Admitting: Interventional Cardiology

## 2019-12-06 ENCOUNTER — Other Ambulatory Visit: Payer: Self-pay

## 2019-12-06 ENCOUNTER — Ambulatory Visit (INDEPENDENT_AMBULATORY_CARE_PROVIDER_SITE_OTHER)
Admission: RE | Admit: 2019-12-06 | Discharge: 2019-12-06 | Disposition: A | Discharge status: Home / Self Care | Payer: Medicare Other | Source: Ambulatory Visit | Attending: Physician Assistant | Admitting: Physician Assistant

## 2019-12-06 DIAGNOSIS — M6208 Separation of muscle (nontraumatic), other site: Secondary | ICD-10-CM

## 2019-12-06 DIAGNOSIS — K439 Ventral hernia without obstruction or gangrene: Secondary | ICD-10-CM

## 2019-12-06 MED ORDER — IOHEXOL 300 MG/ML  SOLN
100.0000 mL | Freq: Once | INTRAMUSCULAR | Status: AC | PRN
  Administered 2019-12-06: 100 mL via INTRAVENOUS

## 2019-12-06 MED ORDER — AMIODARONE HCL 200 MG PO TABS: 200.0000 mg | ORAL_TABLET | Freq: Two times a day (BID) | ORAL | 1 refills | Status: DC

## 2019-12-06 NOTE — Telephone Encounter (Signed)
Pt dropped off a letter while here for a CT.  Letter states that he has been back in Afib since 11/01/2019.  Note mentions that after doing a full day of yard work, including raking, that he was unable to get HR back down.  Spoke with Dr. Tamala Julian and he said to increase Amiodarone to 200mg  BID and have pt seen in Afib clinic to set up DCCV.  Spoke with pt and reviewed these recommendations.  Advised I will send message to Afib clinic to get pt scheduled with them.  Pt verbalized understanding and was appreciative for call.

## 2019-12-07 ENCOUNTER — Other Ambulatory Visit: Payer: Self-pay | Admitting: Interventional Cardiology

## 2019-12-07 ENCOUNTER — Telehealth (HOSPITAL_COMMUNITY): Payer: Self-pay | Admitting: Nurse Practitioner

## 2019-12-07 MED ORDER — AMIODARONE HCL 200 MG PO TABS: 200.0000 mg | ORAL_TABLET | Freq: Two times a day (BID) | ORAL | 2 refills | Status: DC

## 2019-12-07 NOTE — Telephone Encounter (Signed)
Called and left message for patient to call back.  Per message from Dr. Verdia Kuba Nurse, pt needs appt with [facility] to discuss dccv.

## 2019-12-07 NOTE — Telephone Encounter (Signed)
Patient returned my call.  He is aware of appt 12/11/2019 with [facility]

## 2019-12-11 ENCOUNTER — Ambulatory Visit (HOSPITAL_COMMUNITY)
Admission: RE | Admit: 2019-12-11 | Discharge: 2019-12-11 | Disposition: A | Discharge status: Home / Self Care | Payer: Medicare Other | Source: Ambulatory Visit | Attending: Physician Assistant | Admitting: Physician Assistant

## 2019-12-11 ENCOUNTER — Other Ambulatory Visit: Payer: Self-pay

## 2019-12-11 VITALS — BP 146/74 | HR 70 | Ht 69.0 in | Wt 351.8 lb

## 2019-12-11 DIAGNOSIS — D6869 Other thrombophilia: Secondary | ICD-10-CM | POA: Diagnosis not present

## 2019-12-11 DIAGNOSIS — E785 Hyperlipidemia, unspecified: Secondary | ICD-10-CM | POA: Insufficient documentation

## 2019-12-11 DIAGNOSIS — F329 Major depressive disorder, single episode, unspecified: Secondary | ICD-10-CM | POA: Insufficient documentation

## 2019-12-11 DIAGNOSIS — E669 Obesity, unspecified: Secondary | ICD-10-CM | POA: Diagnosis not present

## 2019-12-11 DIAGNOSIS — I4892 Unspecified atrial flutter: Secondary | ICD-10-CM | POA: Diagnosis not present

## 2019-12-11 DIAGNOSIS — Z7901 Long term (current) use of anticoagulants: Secondary | ICD-10-CM | POA: Diagnosis not present

## 2019-12-11 DIAGNOSIS — K219 Gastro-esophageal reflux disease without esophagitis: Secondary | ICD-10-CM | POA: Diagnosis not present

## 2019-12-11 DIAGNOSIS — K5790 Diverticulosis of intestine, part unspecified, without perforation or abscess without bleeding: Secondary | ICD-10-CM | POA: Diagnosis not present

## 2019-12-11 DIAGNOSIS — F410 Panic disorder [episodic paroxysmal anxiety] without agoraphobia: Secondary | ICD-10-CM | POA: Diagnosis not present

## 2019-12-11 DIAGNOSIS — Z79899 Other long term (current) drug therapy: Secondary | ICD-10-CM | POA: Diagnosis not present

## 2019-12-11 DIAGNOSIS — I10 Essential (primary) hypertension: Secondary | ICD-10-CM | POA: Insufficient documentation

## 2019-12-11 DIAGNOSIS — Z6841 Body Mass Index (BMI) 40.0 and over, adult: Secondary | ICD-10-CM | POA: Diagnosis not present

## 2019-12-11 DIAGNOSIS — I4819 Other persistent atrial fibrillation: Secondary | ICD-10-CM | POA: Diagnosis present

## 2019-12-11 MED ORDER — AMIODARONE HCL 200 MG PO TABS: 200.0000 mg | ORAL_TABLET | Freq: Every day | ORAL | 2 refills | Status: DC

## 2019-12-11 NOTE — Progress Notes (Signed)
Primary Care Physician: Carol Ada, MD Primary Cardiologist: Dr Tamala Julian Primary Electrophysiologist: none Referring Physician: Dr Dudley Major is a 78 y.o. male with a history of paroxysmal atrial fibrillation s/p ablation 2013, ascending aortic aneurysm followed by Dr. Cyndia Bent, obesity, and hypertension who presents for consultation in the [facility].  The patient was initially diagnosed with atrial fibrillation remotely and underwent afib ablation in 2013. He has been maintained on amiodarone and Xarelto for a CHADS2VASC score of 3. Patient reports that on 11/01/19 he was working out in the yard and his heart rate remained elevated even with rest and symptoms of palpitations. There were no other specific triggers that he could identify. Dr Tamala Julian increased his amiodarone to 200 mg BID. Patient reports that he converted to SR on 12/10 on his Apple Watch. He denies significant snoring or alcohol use.  Today, he denies symptoms of palpitations, chest pain, shortness of breath, orthopnea, PND, lower extremity edema, dizziness, presyncope, syncope, snoring, daytime somnolence, bleeding, or neurologic sequela. The patient is tolerating medications without difficulties and is otherwise without complaint today.    Atrial Fibrillation Risk Factors:  he does not have symptoms or diagnosis of sleep apnea. Negative sleep study ~3years ago. he does not have a history of rheumatic fever. he does not have a history of alcohol use. The patient does not have a history of early familial atrial fibrillation or other arrhythmias.  he has a BMI of Body mass index is 51.95 kg/m.Marland Kitchen Filed Weights   12/11/19 0841  Weight: (!) 159.6 kg    Family History  Problem Relation Age of Onset  . Heart disease Mother 57       mother  . Heart attack Mother   . Lung cancer Father   . Heart disease Father   . Kidney disease Brother 44       died age 38 heart attack  . Heart  attack Brother   . Heart disease Maternal Grandfather   . Colon cancer Neg Hx      Atrial Fibrillation Management history:  Previous antiarrhythmic drugs: amiodarone Previous cardioversions: 2013, 2015 Previous ablations: 2013 CHADS2VASC score: 3 Anticoagulation history: Xarelto   Past Medical History:  Diagnosis Date  . Allergy   . Atrial fibrillation ([place]) 11/2008  . Atrial fibrillation ([place])   . Atypical atrial flutter ([place]) 07/13/2014   Diagnosis on EKG 07/13/14 : Cardioversion August 2015  . Colon polyp   . Depression   . Diverticulosis   . Dysrhythmia   . GERD (gastroesophageal reflux disease)   . Headache(784.0)   . Hyperlipidemia   . Hypertension   . Panic attacks   . PONV (postoperative nausea and vomiting)   . Thoracic aneurysm    Past Surgical History:  Procedure Laterality Date  . ablataion  07/06/2012  . APPENDECTOMY  1958  . CARDIAC CATHETERIZATION  2009  . CARDIOVERSION  03/18/2012   Procedure: CARDIOVERSION;  Surgeon: [name], MD;  Location: [facility];  Service: Cardiovascular;  Laterality: N/A;  . CARDIOVERSION N/A 08/23/2014   Procedure: CARDIOVERSION;  Surgeon: [name], MD;  Location: [facility];  Service: Cardiovascular;  Laterality: N/A;  . INGUINAL HERNIA REPAIR     right  . LUMBAR LAMINECTOMY/DECOMPRESSION MICRODISCECTOMY Bilateral 02/08/2015   Procedure: Laminectomy and Foraminotomy - bilateral - Lumbar two-three, three-four, left lumbar four-five with resection of synovial cyst ;  Surgeon: Charlie Pitter, MD;  Location: [facility];  Service: Neurosurgery;  Laterality: Bilateral;  . spinal tap    . VASECTOMY      Current Outpatient Medications  Medication Sig Dispense Refill  . acetaminophen (TYLENOL) 500 MG tablet Take 500 mg by mouth every 6 (six) hours as needed (pain).    Marland Kitchen ALPRAZolam (XANAX) 0.5 MG tablet Take 0.5-1 mg by mouth 3 (three) times daily as needed for anxiety or sleep.     Marland Kitchen amiodarone (PACERONE) 200 MG tablet  Take 1 tablet (200 mg total) by mouth daily. 90 tablet 2  . Azelaic Acid 15 % cream APPLY TO FACE ONCE TO TWICE A DAY AS NEEDED FOR ROSACEA.    Marland Kitchen doxycycline (VIBRA-TABS) 100 MG tablet Take 100 mg by mouth 2 (two) times daily.  2  . hydrochlorothiazide (MICROZIDE) 12.5 MG capsule TAKE 1 CAPSULE(12.5 MG) BY MOUTH DAILY 90 capsule 2  . metoprolol succinate (TOPROL-XL) 100 MG 24 hr tablet Take 100 mg by mouth daily. Take with or immediately following a meal.    . olmesartan (BENICAR) 40 MG tablet Take 40 mg by mouth daily.    . pantoprazole (PROTONIX) 40 MG tablet Take 40 mg by mouth daily.  0  . potassium chloride SA (K-DUR,KLOR-CON) 20 MEQ tablet Take 20 mEq by mouth daily.    . rosuvastatin (CRESTOR) 20 MG tablet Take 1 tablet (20 mg total) by mouth daily. 90 tablet 3  . sertraline (ZOLOFT) 100 MG tablet Take 100 mg by mouth daily.  0  . XARELTO 20 MG TABS tablet TAKE 1 TABLET BY MOUTH EVERY DAY WITH DINNER 90 tablet 2   No current facility-administered medications for this encounter.    No Known Allergies  Social History   Socioeconomic History  . Marital status: Married    Spouse name: Camran Tietjen  . Number of children: 2  . Years of education: College  . Highest education level: Not on file  Occupational History  . Occupation: Retired  Tobacco Use  . Smoking status: Never Smoker  . Smokeless tobacco: Never Used  Substance and Sexual Activity  . Alcohol use: No  . Drug use: No  . Sexual activity: Not on file  Other Topics Concern  . Not on file  Social History Narrative   Patient lives at home with spouse.   Caffeine Use: 1 cup daily   Social Determinants of Health   Financial Resource Strain:   . Difficulty of Paying Living Expenses: Not on file  Food Insecurity:   . Worried About Charity fundraiser in the Last Year: Not on file  . Ran Out of Food in the Last Year: Not on file  Transportation Needs:   . Lack of Transportation (Medical): Not on file  . Lack  of Transportation (Non-Medical): Not on file  Physical Activity:   . Days of Exercise per Week: Not on file  . Minutes of Exercise per Session: Not on file  Stress:   . Feeling of Stress : Not on file  Social Connections:   . Frequency of Communication with Friends and Family: Not on file  . Frequency of Social Gatherings with Friends and Family: Not on file  . Attends Religious Services: Not on file  . Active Member of Clubs or Organizations: Not on file  . Attends Archivist Meetings: Not on file  . Marital Status: Not on file  Intimate Partner Violence:   . Fear of Current or Ex-Partner: Not on file  . Emotionally Abused: Not on  file  . Physically Abused: Not on file  . Sexually Abused: Not on file     ROS- All systems are reviewed and negative except as per the HPI above.  Physical Exam: Vitals:   12/11/19 0841  BP: (!) 146/74  Pulse: 70  Weight: (!) 159.6 kg  Height: 5\' 9"  (1.753 m)    GEN- The patient is well appearing obese elderly male, alert and oriented x 3 today.   Head- normocephalic, atraumatic Eyes-  Sclera clear, conjunctiva pink Ears- hearing intact Oropharynx- clear Neck- supple  Lungs- Clear to ausculation bilaterally, normal work of breathing Heart- Regular rate and rhythm, no murmurs, rubs or gallops  GI- soft, NT, ND, + BS Extremities- no clubbing, cyanosis, or edema MS- no significant deformity or atrophy Skin- no rash or lesion Psych- euthymic mood, full affect Neuro- strength and sensation are intact  Wt Readings from Last 3 Encounters:  12/11/19 (!) 159.6 kg  11/29/19 (!) 155.1 kg  09/06/19 (!) 155.3 kg    EKG today demonstrates SR HR 70, 1st degree AV block, PAC, PR 232, QRS 88, QTc 468   Epic records are reviewed at length today  Assessment and Plan:  1. Persistent atrial fibrillation Patient converted with amiodarone. Will decrease amiodarone to 200 mg daily. Could consider decreasing back to 100 mg daily as long as  he maintains SR. Recent TSH and LFTs reviewed.  Check echocardiogram  Continue Xarelto 20 mg daily  This patients CHA2DS2-VASc Score and unadjusted Ischemic Stroke Rate (% per year) is equal to 3.2 % stroke rate/year from a score of 3  Above score calculated as 1 point each if present [CHF, HTN, DM, Vascular=MI/PAD/Aortic Plaque, Age if 65-74, or Male] Above score calculated as 2 points each if present [Age > 75, or Stroke/TIA/TE]   2. Obesity Body mass index is 51.95 kg/m. Lifestyle modification was discussed at length including regular exercise and weight reduction. Patient plans to cut back on carbohydrates and walk for exercise. We discussed that his recent weight gain could likely be contributing to his breakthrough afib.  3. HTN Stable, no changes today.   Follow up with Dr Tamala Julian as scheduled.    [facility] [address] [place], [place] [address] [phone] 12/11/2019 9:17 AM

## 2019-12-11 NOTE — Patient Instructions (Signed)
Decrease Amiodarone 200mg once daily °

## 2019-12-13 ENCOUNTER — Ambulatory Visit (HOSPITAL_COMMUNITY)
Admission: RE | Admit: 2019-12-13 | Discharge: 2019-12-13 | Disposition: A | Discharge status: Home / Self Care | Payer: Medicare Other | Source: Ambulatory Visit | Attending: Physician Assistant | Admitting: Physician Assistant

## 2019-12-13 ENCOUNTER — Other Ambulatory Visit: Payer: Self-pay

## 2019-12-13 DIAGNOSIS — I4892 Unspecified atrial flutter: Secondary | ICD-10-CM | POA: Insufficient documentation

## 2019-12-13 DIAGNOSIS — I10 Essential (primary) hypertension: Secondary | ICD-10-CM | POA: Insufficient documentation

## 2019-12-13 DIAGNOSIS — E785 Hyperlipidemia, unspecified: Secondary | ICD-10-CM | POA: Diagnosis not present

## 2019-12-13 DIAGNOSIS — I4819 Other persistent atrial fibrillation: Secondary | ICD-10-CM | POA: Diagnosis present

## 2019-12-13 NOTE — Progress Notes (Signed)
  Echocardiogram 2D Echocardiogram has been performed.  Joseph [name] 12/13/2019, 8:58 AM

## 2019-12-14 ENCOUNTER — Encounter (HOSPITAL_COMMUNITY): Payer: Self-pay | Admitting: *Deleted

## 2020-01-02 ENCOUNTER — Telehealth: Payer: Self-pay | Admitting: Interventional Cardiology

## 2020-01-02 NOTE — Telephone Encounter (Signed)
New Message:   Wife called and said pt will need a note from his Cardiologist to get the COVID Vaccine, because he is on Xarelto please.

## 2020-01-02 NOTE — Telephone Encounter (Signed)
Spoke with pt's wife and she states pt had reached out to [employer] about getting his covid vaccine and they require a letter to get it because he is on Xarelto.  Wife reached out to [facility] and they did not ask for a letter.  She states that pt will plan to get in [place] to reach out if for some reason he is unable to get at [facility] because they need a letter as well.  Wife appreciative for call.

## 2020-01-02 NOTE — Telephone Encounter (Signed)
We received an email that this may start occurring with pt's on anticoags.  Pt's will need a letter stating that it is ok for them to receive the vaccine while on anticoags.

## 2020-01-06 ENCOUNTER — Ambulatory Visit: Payer: Medicare Other | Attending: Internal Medicine

## 2020-01-06 DIAGNOSIS — Z23 Encounter for immunization: Secondary | ICD-10-CM

## 2020-01-06 NOTE — Progress Notes (Signed)
   Covid-19 Vaccination Clinic  Name:  Joseph [name]    MRN: [id number] DOB: [birth year]  01/06/2020  [name] was observed post Covid-19 immunization for 30 minutes based on pre-vaccination screening without incidence. He was provided with Vaccine Information Sheet and instruction to access the V-Safe system.   [name] was instructed to call 911 with any severe reactions post vaccine: Marland Kitchen Difficulty breathing  . Swelling of your face and throat  . A fast heartbeat  . A bad rash all over your body  . Dizziness and weakness    Immunizations Administered    Name Date Dose VIS Date Route   Pfizer COVID-19 Vaccine 01/06/2020  2:34 PM 0.3 mL 12/08/2019 Intramuscular   Manufacturer: [place]   Lot: [id number]   [place]: [id number]

## 2020-01-26 ENCOUNTER — Ambulatory Visit: Payer: Self-pay

## 2020-01-27 ENCOUNTER — Ambulatory Visit: Payer: Medicare PPO | Attending: Internal Medicine

## 2020-01-27 DIAGNOSIS — Z23 Encounter for immunization: Secondary | ICD-10-CM | POA: Insufficient documentation

## 2020-01-27 NOTE — Progress Notes (Signed)
   Covid-19 Vaccination Clinic  Name:  Joseph [name]    MRN: [id number] DOB: 1941-08-02  01/27/2020  [name] was observed post Covid-19 immunization for 15 minutes without incidence. He was provided with Vaccine Information Sheet and instruction to access the V-Safe system.   [name] was instructed to call 911 with any severe reactions post vaccine: Marland Kitchen Difficulty breathing  . Swelling of your face and throat  . A fast heartbeat  . A bad rash all over your body  . Dizziness and weakness    Immunizations Administered    Name Date Dose VIS Date Route   Pfizer COVID-19 Vaccine 01/27/2020 11:11 AM 0.3 mL 12/08/2019 Intramuscular   Manufacturer: [place]   Lot: [id number]   NDC: [place] COVID-19 Vaccine 01/27/2020  2:45 PM 0.3 mL 12/08/2019 Intramuscular   Manufacturer: [place]   Lot: [id number]   [place]: [id number]

## 2020-02-19 ENCOUNTER — Other Ambulatory Visit: Payer: Self-pay

## 2020-02-19 MED ORDER — RIVAROXABAN 20 MG PO TABS: 20.0000 mg | ORAL_TABLET | Freq: Every day | ORAL | 2 refills | Status: DC

## 2020-02-19 NOTE — Telephone Encounter (Signed)
Xarelto 20mg  refill request received. Pt is 79years old, weight-159.6kg, Crea-1.26 on 11/01/2019, last seen by Malka So on 12/11/2019, Diagnosis-Afib, CrCl-109.4ml/min; Dose is appropriate based on dosing criteria. Will send in refill to requested pharmacy.

## 2020-03-14 ENCOUNTER — Ambulatory Visit: Payer: Medicare Other | Admitting: Interventional Cardiology

## 2020-06-13 NOTE — Progress Notes (Signed)
Cardiology Office Note:    Date:  06/21/2020   ID:  Joseph [name], DOB 07-Apr-1941, MRN [id number]  PCP:  Carol Ada, MD  Cardiologist:  [name], MD   Referring MD: Carol Ada, MD   Chief Complaint  Patient presents with  . Atrial Fibrillation    History of Present Illness:    Joseph [name] is a 79 y.o. male with a hx of paroxysmal atrial fibrillation, history of atrial fibrillation ablation, amiodarone therapy for maintenance of sinus rhythm, chronic anticoagulation therapy, ascending aortic aneurysm measuring 4.8 cm and followed by Dr. Cyndia Bent, obesity, and hypertension.  He is doing well.  Only downer is that he is gaining weight.  Has some lower extremity swelling but is controlled by compression stockings.  He denies episodes of atrial fibrillation.  He denies medication side effects.  He specifically does not feel there have been any side effects related to amiodarone.  No bleeding on rivaroxaban.  Past Medical History:  Diagnosis Date  . Allergy   . Atrial fibrillation ([place]) 11/2008  . Atrial fibrillation ([place])   . Atypical atrial flutter ([place]) 07/13/2014   Diagnosis on EKG 07/13/14 : Cardioversion August 2015  . Colon polyp   . Depression   . Diverticulosis   . Dysrhythmia   . GERD (gastroesophageal reflux disease)   . Headache(784.0)   . Hyperlipidemia   . Hypertension   . Panic attacks   . PONV (postoperative nausea and vomiting)   . Thoracic aneurysm     Past Surgical History:  Procedure Laterality Date  . ablataion  07/06/2012  . APPENDECTOMY  1958  . CARDIAC CATHETERIZATION  2009  . CARDIOVERSION  03/18/2012   Procedure: CARDIOVERSION;  Surgeon: [name], MD;  Location: [facility];  Service: Cardiovascular;  Laterality: N/A;  . CARDIOVERSION N/A 08/23/2014   Procedure: CARDIOVERSION;  Surgeon: [name], MD;  Location: [facility];  Service: Cardiovascular;  Laterality: N/A;  . INGUINAL HERNIA REPAIR     right  . LUMBAR  LAMINECTOMY/DECOMPRESSION MICRODISCECTOMY Bilateral 02/08/2015   Procedure: Laminectomy and Foraminotomy - bilateral - Lumbar two-three, three-four, left lumbar four-five with resection of synovial cyst ;  Surgeon: Charlie Pitter, MD;  Location: [facility];  Service: Neurosurgery;  Laterality: Bilateral;  . spinal tap    . VASECTOMY      Current Medications: Current Meds  Medication Sig  . acetaminophen (TYLENOL) 500 MG tablet Take 500 mg by mouth every 6 (six) hours as needed (pain).  Marland Kitchen ALPRAZolam (XANAX) 0.5 MG tablet Take 0.5-1 mg by mouth 3 (three) times daily as needed for anxiety or sleep.   Marland Kitchen amiodarone (PACERONE) 200 MG tablet Take 1 tablet (200 mg total) by mouth daily.  . Azelaic Acid 15 % cream APPLY TO FACE ONCE TO TWICE A DAY AS NEEDED FOR ROSACEA.  Marland Kitchen doxycycline (VIBRA-TABS) 100 MG tablet Take 100 mg by mouth 2 (two) times daily.  . hydrochlorothiazide (MICROZIDE) 12.5 MG capsule TAKE 1 CAPSULE(12.5 MG) BY MOUTH DAILY  . metoprolol succinate (TOPROL-XL) 100 MG 24 hr tablet Take 100 mg by mouth daily. Take with or immediately following a meal.  . olmesartan (BENICAR) 40 MG tablet Take 40 mg by mouth daily.  . pantoprazole (PROTONIX) 40 MG tablet Take 40 mg by mouth daily.  . potassium chloride SA (K-DUR,KLOR-CON) 20 MEQ tablet Take 20 mEq by mouth daily.  . rivaroxaban (XARELTO) 20 MG TABS tablet Take 1 tablet (20 mg total)  by mouth daily with supper.  . rosuvastatin (CRESTOR) 20 MG tablet Take 1 tablet (20 mg total) by mouth daily.  . sertraline (ZOLOFT) 100 MG tablet Take 100 mg by mouth daily.     Allergies:   Patient has no known allergies.   Social History   Socioeconomic History  . Marital status: Married    Spouse name: Skylier Kretschmer  . Number of children: 2  . Years of education: College  . Highest education level: Not on file  Occupational History  . Occupation: Retired  Tobacco Use  . Smoking status: Never Smoker  . Smokeless tobacco: Never Used    Vaping Use  . Vaping Use: Never used  Substance and Sexual Activity  . Alcohol use: No  . Drug use: No  . Sexual activity: Not on file  Other Topics Concern  . Not on file  Social History Narrative   Patient lives at home with spouse.   Caffeine Use: 1 cup daily   Social Determinants of Health   Financial Resource Strain:   . Difficulty of Paying Living Expenses:   Food Insecurity:   . Worried About Charity fundraiser in the Last Year:   . Arboriculturist in the Last Year:   Transportation Needs:   . Film/video editor (Medical):   Marland Kitchen Lack of Transportation (Non-Medical):   Physical Activity:   . Days of Exercise per Week:   . Minutes of Exercise per Session:   Stress:   . Feeling of Stress :   Social Connections:   . Frequency of Communication with Friends and Family:   . Frequency of Social Gatherings with Friends and Family:   . Attends Religious Services:   . Active Member of Clubs or Organizations:   . Attends Archivist Meetings:   Marland Kitchen Marital Status:      Family History: The patient's family history includes Heart attack in his brother and mother; Heart disease in his father and maternal grandfather; Heart disease (age of onset: 78) in his mother; Kidney disease (age of onset: 50) in his brother; Lung cancer in his father. There is no history of Colon cancer.  ROS:   Please see the history of present illness.    Significant back discomfort.  He has mechanical spine disease.  He is unable to lose weight and in fact is continuing to gain.  All other systems reviewed and are negative.  EKGs/Labs/Other Studies Reviewed:    The following studies were reviewed today: Laboratory data done recently (early June) at Dr. Carol Ada are all within limits.  EKG:  EKG not repeated  Recent Labs: 11/01/2019: ALT 20; BUN 12; Creatinine, Ser 1.26; Potassium 4.6; Sodium 136  Recent Lipid Panel    Component Value Date/Time   CHOL 151 11/01/2019 0758   TRIG  171 (H) 11/01/2019 0758   HDL 55 11/01/2019 0758   CHOLHDL 2.7 11/01/2019 0758   CHOLHDL 3 05/13/2011 0900   VLDL 17.4 05/13/2011 0900   LDLCALC 67 11/01/2019 0758    Physical Exam:    VS:  BP 134/82   Pulse (!) 56   Ht 5\' 9"  (1.753 m)   Wt (!) 342 lb 12.8 oz (155.5 kg)   SpO2 98%   BMI 50.62 kg/m     Wt Readings from Last 3 Encounters:  06/21/20 (!) 342 lb 12.8 oz (155.5 kg)  12/11/19 (!) 351 lb 12.8 oz (159.6 kg)  11/29/19 (!) 342 lb (155.1 kg)  GEN: Markedly obese. No acute distress HEENT: Normal NECK: No JVD. LYMPHATICS: No lymphadenopathy CARDIAC:  RRR without murmur, gallop, or edema. VASCULAR:  Normal Pulses. No bruits. RESPIRATORY:  Clear to auscultation without rales, wheezing or rhonchi  ABDOMEN: Soft, non-tender, non-distended, No pulsatile mass, MUSCULOSKELETAL: No deformity  SKIN: Warm and dry NEUROLOGIC:  Alert and oriented x 3 PSYCHIATRIC:  Normal affect   ASSESSMENT:    1. Persistent atrial fibrillation ([place])   2. Essential hypertension   3. Chronic anticoagulation   4. On amiodarone therapy   5. Other hyperlipidemia    PLAN:    In order of problems listed above:  1. Clinically in normal sinus rhythm.  Continue amiodarone 100 mg/day. 2. Excellent blood pressure control on current therapy.  Continue Benicar, Toprol, Microzide. 3. Continue Xarelto 20 mg/day. 4. Biannual liver panel and TSH.  Will need a chest x-ray 5. Excellent current lipid panel.  Clinical follow-up in 6 months with team membe or myself.  No change in therapy.   Medication Adjustments/Labs and Tests Ordered: Current medicines are reviewed at length with the patient today.  Concerns regarding medicines are outlined above.  No orders of the defined types were placed in this encounter.  No orders of the defined types were placed in this encounter.   There are no Patient Instructions on file for this visit.   Signed, [name], MD  06/21/2020 8:06 AM    [place]

## 2020-06-21 ENCOUNTER — Encounter: Payer: Self-pay | Admitting: Interventional Cardiology

## 2020-06-21 ENCOUNTER — Ambulatory Visit: Payer: Medicare PPO | Admitting: Interventional Cardiology

## 2020-06-21 ENCOUNTER — Other Ambulatory Visit: Payer: Self-pay

## 2020-06-21 VITALS — BP 134/82 | HR 56 | Ht 69.0 in | Wt 342.8 lb

## 2020-06-21 DIAGNOSIS — I10 Essential (primary) hypertension: Secondary | ICD-10-CM

## 2020-06-21 DIAGNOSIS — Z7901 Long term (current) use of anticoagulants: Secondary | ICD-10-CM | POA: Diagnosis not present

## 2020-06-21 DIAGNOSIS — I4819 Other persistent atrial fibrillation: Secondary | ICD-10-CM

## 2020-06-21 DIAGNOSIS — E7849 Other hyperlipidemia: Secondary | ICD-10-CM

## 2020-06-21 DIAGNOSIS — Z79899 Other long term (current) drug therapy: Secondary | ICD-10-CM | POA: Diagnosis not present

## 2020-06-21 NOTE — Patient Instructions (Signed)

## 2020-06-25 ENCOUNTER — Telehealth: Payer: Self-pay | Admitting: Interventional Cardiology

## 2020-06-25 NOTE — Telephone Encounter (Signed)
    Pt c/o medication issue:  1. Name of Medication: rivaroxaban (XARELTO) 20 MG TABS tablet  2. How are you currently taking this medication (dosage and times per day)?   3. Are you having a reaction (difficulty breathing--STAT)?   4. What is your medication issue? Pt's wife would like to switch pt's Xarelto to generic brand RIVAROXABAN. They just wanted to get generic since xarelto is too expensive

## 2020-06-25 NOTE — Telephone Encounter (Signed)
Spoke with wife and made her aware that the generic for Xarelto is not out yet.  Advised I will send message to our Prior Auth nurse to see if pt qualifies for a PA or any kind of discount.

## 2020-06-25 NOTE — Telephone Encounter (Signed)
**Note De-Identified Joseph [name] Obfuscation** Vinnie Level, the pts wife states that they were wanting to switch the pt to generic Xarelto but that Dr Darliss Ridgel has already explained that Rivaroxaban is not on the market yet. She states that they are ok with cost of Xarelto at this time and was just hoping the generic was available. She thanked me for calling her back.

## 2020-08-10 ENCOUNTER — Other Ambulatory Visit: Payer: Self-pay | Admitting: Interventional Cardiology

## 2020-08-12 NOTE — Telephone Encounter (Signed)
Last OV 06/21/20 Scr 1.26 on 11/01/2019 Weight 155kg ABW crcl =92 ml/min

## 2020-08-20 ENCOUNTER — Other Ambulatory Visit: Payer: Self-pay | Admitting: Interventional Cardiology

## 2020-09-24 ENCOUNTER — Ambulatory Visit: Payer: Medicare PPO | Attending: Internal Medicine

## 2020-09-24 DIAGNOSIS — Z23 Encounter for immunization: Secondary | ICD-10-CM

## 2020-09-24 NOTE — Progress Notes (Signed)
   Covid-19 Vaccination Clinic  Name:  Joseph [name]    MRN: [id number] DOB: 1941-06-16  09/24/2020  [name] was observed post Covid-19 immunization for 15 minutes without incident. He was provided with Vaccine Information Sheet and instruction to access the V-Safe system.   [name] was instructed to call 911 with any severe reactions post vaccine: Marland Kitchen Difficulty breathing  . Swelling of face and throat  . A fast heartbeat  . A bad rash all over body  . Dizziness and weakness

## 2020-10-02 DIAGNOSIS — I10 Essential (primary) hypertension: Secondary | ICD-10-CM | POA: Diagnosis not present

## 2020-10-02 DIAGNOSIS — H25099 Other age-related incipient cataract, unspecified eye: Secondary | ICD-10-CM | POA: Diagnosis not present

## 2020-10-02 DIAGNOSIS — H52223 Regular astigmatism, bilateral: Secondary | ICD-10-CM | POA: Diagnosis not present

## 2020-10-02 DIAGNOSIS — H5201 Hypermetropia, right eye: Secondary | ICD-10-CM | POA: Diagnosis not present

## 2020-10-02 DIAGNOSIS — H524 Presbyopia: Secondary | ICD-10-CM | POA: Diagnosis not present

## 2020-10-31 DIAGNOSIS — Z20828 Contact with and (suspected) exposure to other viral communicable diseases: Secondary | ICD-10-CM | POA: Diagnosis not present

## 2020-12-11 ENCOUNTER — Other Ambulatory Visit: Payer: Self-pay | Admitting: Interventional Cardiology

## 2020-12-11 DIAGNOSIS — Z85828 Personal history of other malignant neoplasm of skin: Secondary | ICD-10-CM | POA: Diagnosis not present

## 2020-12-11 DIAGNOSIS — L918 Other hypertrophic disorders of the skin: Secondary | ICD-10-CM | POA: Diagnosis not present

## 2020-12-11 DIAGNOSIS — D225 Melanocytic nevi of trunk: Secondary | ICD-10-CM | POA: Diagnosis not present

## 2020-12-11 DIAGNOSIS — L570 Actinic keratosis: Secondary | ICD-10-CM | POA: Diagnosis not present

## 2020-12-11 DIAGNOSIS — L821 Other seborrheic keratosis: Secondary | ICD-10-CM | POA: Diagnosis not present

## 2020-12-11 DIAGNOSIS — D1801 Hemangioma of skin and subcutaneous tissue: Secondary | ICD-10-CM | POA: Diagnosis not present

## 2020-12-11 DIAGNOSIS — L814 Other melanin hyperpigmentation: Secondary | ICD-10-CM | POA: Diagnosis not present

## 2020-12-11 DIAGNOSIS — D0462 Carcinoma in situ of skin of left upper limb, including shoulder: Secondary | ICD-10-CM | POA: Diagnosis not present

## 2021-01-09 ENCOUNTER — Other Ambulatory Visit: Payer: Self-pay | Admitting: Interventional Cardiology

## 2021-01-15 ENCOUNTER — Ambulatory Visit
Admission: RE | Admit: 2021-01-15 | Discharge: 2021-01-15 | Disposition: A | Discharge status: Home / Self Care | Payer: Medicare PPO | Source: Ambulatory Visit | Attending: Interventional Cardiology | Admitting: Interventional Cardiology

## 2021-01-15 ENCOUNTER — Other Ambulatory Visit: Payer: Self-pay

## 2021-01-15 DIAGNOSIS — Z79899 Other long term (current) drug therapy: Secondary | ICD-10-CM | POA: Diagnosis not present

## 2021-01-16 NOTE — Progress Notes (Signed)
Cardiology Office Note:    Date:  01/20/2021   ID:  Joseph [name], DOB [birth year], MRN [id number]  PCP:  Carol Ada, MD  Cardiologist:  [name], MD   Referring MD: Carol Ada, MD   Chief Complaint  Patient presents with  . Atrial Fibrillation    History of Present Illness:    Joseph [name] is a 80 y.o. male with a hx of paroxysmal atrial fibrillation, history of atrial fibrillation ablation, amiodarone therapy for maintenance of sinus rhythm, chronic anticoagulation therapy, ascending aortic aneurysm measuring 4.8 cm and followed by Dr. Cyndia Bent, obesity, and hypertension  No new symptoms that are concerning for heart.  Takes medications as instructed.  Chest pain and transient neurological symptoms.  No blood in his urine or stool.  Past Medical History:  Diagnosis Date  . Allergy   . Atrial fibrillation ([place]) 11/2008  . Atrial fibrillation ([place])   . Atypical atrial flutter ([place]) 07/13/2014   Diagnosis on EKG 07/13/14 : Cardioversion August 2015  . Colon polyp   . Depression   . Diverticulosis   . Dysrhythmia   . GERD (gastroesophageal reflux disease)   . Headache(784.0)   . Hyperlipidemia   . Hypertension   . Panic attacks   . PONV (postoperative nausea and vomiting)   . Thoracic aneurysm     Past Surgical History:  Procedure Laterality Date  . ablataion  07/06/2012  . APPENDECTOMY  1958  . CARDIAC CATHETERIZATION  2009  . CARDIOVERSION  03/18/2012   Procedure: CARDIOVERSION;  Surgeon: [name], MD;  Location: [facility];  Service: Cardiovascular;  Laterality: N/A;  . CARDIOVERSION N/A 08/23/2014   Procedure: CARDIOVERSION;  Surgeon: [name], MD;  Location: [facility];  Service: Cardiovascular;  Laterality: N/A;  . INGUINAL HERNIA REPAIR     right  . LUMBAR LAMINECTOMY/DECOMPRESSION MICRODISCECTOMY Bilateral 02/08/2015   Procedure: Laminectomy and Foraminotomy - bilateral - Lumbar two-three, three-four, left lumbar four-five  with resection of synovial cyst ;  Surgeon: Charlie Pitter, MD;  Location: [facility];  Service: Neurosurgery;  Laterality: Bilateral;  . spinal tap    . VASECTOMY      Current Medications: Current Meds  Medication Sig  . acetaminophen (TYLENOL) 500 MG tablet Take 500 mg by mouth every 6 (six) hours as needed (pain).  Marland Kitchen ALPRAZolam (XANAX) 0.5 MG tablet Take 0.5-1 mg by mouth 3 (three) times daily as needed for anxiety or sleep.   Marland Kitchen amiodarone (PACERONE) 200 MG tablet Take 200 mg by mouth daily.  Marland Kitchen doxycycline (VIBRA-TABS) 100 MG tablet Take 100 mg by mouth 2 (two) times daily.  . hydrochlorothiazide (MICROZIDE) 12.5 MG capsule TAKE 1 CAPSULE(12.5 MG) BY MOUTH DAILY  . metoprolol succinate (TOPROL-XL) 100 MG 24 hr tablet Take 100 mg by mouth daily. Take with or immediately following a meal.  . olmesartan (BENICAR) 40 MG tablet Take 40 mg by mouth daily.  . pantoprazole (PROTONIX) 40 MG tablet Take 40 mg by mouth daily.  . potassium chloride SA (K-DUR,KLOR-CON) 20 MEQ tablet Take 20 mEq by mouth daily.  . rosuvastatin (CRESTOR) 20 MG tablet TAKE 1 TABLET(20 MG) BY MOUTH DAILY  . sertraline (ZOLOFT) 100 MG tablet Take 100 mg by mouth daily.  Alveda Reasons 20 MG TABS tablet TAKE 1 TABLET(20 MG) BY MOUTH DAILY WITH SUPPER  . [DISCONTINUED] amiodarone (PACERONE) 200 MG tablet TAKE 1 TABLET(200 MG) BY MOUTH TWICE DAILY (Patient taking differently: Take 200 mg by  mouth daily.)  . [DISCONTINUED] Azelaic Acid 15 % cream APPLY TO FACE ONCE TO TWICE A DAY AS NEEDED FOR ROSACEA.  . [DISCONTINUED] famotidine (PEPCID AC) 10 MG chewable tablet Chew 10 mg by mouth as needed.     Allergies:   Patient has no known allergies.   Social History   Socioeconomic History  . Marital status: Married    Spouse name: Ceejay Marrin  . Number of children: 2  . Years of education: College  . Highest education level: Not on file  Occupational History  . Occupation: Retired  Tobacco Use  . Smoking status: Never  Smoker  . Smokeless tobacco: Never Used  Vaping Use  . Vaping Use: Never used  Substance and Sexual Activity  . Alcohol use: No  . Drug use: No  . Sexual activity: Not on file  Other Topics Concern  . Not on file  Social History Narrative   Patient lives at home with spouse.   Caffeine Use: 1 cup daily   Social Determinants of Health   Financial Resource Strain: Not on file  Food Insecurity: Not on file  Transportation Needs: Not on file  Physical Activity: Not on file  Stress: Not on file  Social Connections: Not on file     Family History: The patient's family history includes Heart attack in his brother and mother; Heart disease in his father and maternal grandfather; Heart disease (age of onset: 56) in his mother; Kidney disease (age of onset: 31) in his brother; Lung cancer in his father. There is no history of Colon cancer.  ROS:   Please see the history of present illness.    Occultly ambulating because of L4-S1 discitis and chronic pain.  He has not had Covid.  He denies orthopnea.  Has chronic bilateral lower extremity swelling for which he wears compression stockings regularly.  All other systems reviewed and are negative.  EKGs/Labs/Other Studies Reviewed:    The following studies were reviewed today: No new imaging data.  Recent LDL 50 June 2021, hemoglobin 14.01 June 2020, creatinine 1.19 June 2020, no potassium level and TSH June 2021 3.36.  CT scan 10/2019: IMPRESSION: 1. Stable 4.8 cm ascending thoracic aortic aneurysm. Aortic aneurysm NOS (ICD10-I71.9). Ascending thoracic aortic aneurysm. Recommend semi-annual imaging followup by CTA or MRA and referral to cardiothoracic surgery if not already obtained. This recommendation follows 2010 ACCF/AHA/AATS/ACR/ASA/SCA/SCAI/SIR/STS/SVM Guidelines for the Diagnosis and Management of Patients With Thoracic Aortic Disease. Circulation. 2010; [id number]. Aortic aneurysm NOS (ICD10-I71.9).  2.  Colonic  diverticulosis.  3.  1.2 cm hypodense left thyroid nodule.  EKG:  EKG ` normal sinus rhythm, first-degree AV block, left axis deviation, poor R wave progression.  When compared to 120, no changes noted.  Recent Labs: No results found for requested labs within last 8760 hours.  Recent Lipid Panel    Component Value Date/Time   CHOL 151 11/01/2019 0758   TRIG 171 (H) 11/01/2019 0758   HDL 55 11/01/2019 0758   CHOLHDL 2.7 11/01/2019 0758   CHOLHDL 3 05/13/2011 0900   VLDL 17.4 05/13/2011 0900   LDLCALC 67 11/01/2019 0758    Physical Exam:    VS:  BP 136/80   Pulse 65   Ht 5\' 9"  (1.753 m)   Wt (!) 350 lb (158.8 kg)   SpO2 96%   BMI 51.69 kg/m     Wt Readings from Last 3 Encounters:  01/20/21 (!) 350 lb (158.8 kg)  06/21/20 (!) 342 lb 12.8  oz (155.5 kg)  12/11/19 (!) 351 lb 12.8 oz (159.6 kg)     GEN: Morbidly obese. No acute distress HEENT: Normal NECK: No JVD. LYMPHATICS: No lymphadenopathy CARDIAC: No murmur. RRR no gallop, or edema. VASCULAR:  Normal Pulses. No bruits. RESPIRATORY:  Clear to auscultation without rales, wheezing or rhonchi  ABDOMEN: Soft, non-tender, non-distended, No pulsatile mass, MUSCULOSKELETAL: No deformity  SKIN: Warm and dry NEUROLOGIC:  Alert and oriented x 3 PSYCHIATRIC:  Normal affect   ASSESSMENT:    1. Persistent atrial fibrillation ([place])   2. Essential hypertension   3. Chronic anticoagulation   4. On amiodarone therapy   5. Other hyperlipidemia   6. Thoracic aortic aneurysm without rupture ([place])   7. Educated about COVID-19 virus infection    PLAN:    In order of problems listed above:  1. Sinus rhythm.  Amiodarone 200 mg/day without side effects evidence of toxicity. 2. If he can lose weight blood pressure regimen could be reduced.  Continue Microzide 12.5 mg/day, Toprol-XL 100 mg/day, Benicar 40 mg/day, and sodium restriction in diet. 3. Continue Xarelto.  Monitor for blood in stool. 4. Continue amiodarone 200  mg/day. 5. LDL was less than 70 when checked in June.  Continue same therapy. 6. Consider repeating CT scan of the chest this summer when the patient returns for clinical follow-up. 7. Vaccinated and practicing social distancing.   Medication Adjustments/Labs and Tests Ordered: Current medicines are reviewed at length with the patient today.  Concerns regarding medicines are outlined above.  No orders of the defined types were placed in this encounter.  No orders of the defined types were placed in this encounter.   Patient Instructions  Medication Instructions:  Your physician recommends that you continue on your current medications as directed. Please refer to the Current Medication list given to you today.  *If you need a refill on your cardiac medications before your next appointment, please call your pharmacy*   Lab Work: None If you have labs (blood work) drawn today and your tests are completely normal, you will receive your results only by: Marland Kitchen MyChart Message (if you have MyChart) OR . A paper copy in the mail If you have any lab test that is abnormal or we need to change your treatment, we will call you to review the results.   Testing/Procedures: None   Follow-Up: At [facility], you and your health needs are our priority.  As part of our continuing mission to provide you with exceptional heart care, we have created designated Provider Care Teams.  These Care Teams include your primary Cardiologist (physician) and Advanced Practice Providers (APPs -  Physician Assistants and Nurse Practitioners) who all work together to provide you with the care you need, when you need it.  We recommend signing up for the patient portal called "MyChart".  Sign up information is provided on this After Visit Summary.  MyChart is used to connect with patients for Virtual Visits (Telemedicine).  Patients are able to view lab/test results, encounter notes, upcoming appointments, etc.   Non-urgent messages can be sent to your provider as well.   To learn more about what you can do with MyChart, go to [email or web address].    Your next appointment:   6 month(s)  The format for your next appointment:   In Person  Provider:   You may see [name], MD or one of the following Advanced Practice Providers on your designated Care Team:    Cecilie Kicks,  NP  Kathyrn Drown, NP    Other Instructions      Signed, [name], MD  01/20/2021 11:04 AM    [place]

## 2021-01-20 ENCOUNTER — Other Ambulatory Visit: Payer: Self-pay

## 2021-01-20 ENCOUNTER — Ambulatory Visit: Payer: Medicare PPO | Admitting: Interventional Cardiology

## 2021-01-20 ENCOUNTER — Encounter: Payer: Self-pay | Admitting: Interventional Cardiology

## 2021-01-20 VITALS — BP 136/80 | HR 65 | Ht 69.0 in | Wt 350.0 lb

## 2021-01-20 DIAGNOSIS — Z7901 Long term (current) use of anticoagulants: Secondary | ICD-10-CM | POA: Diagnosis not present

## 2021-01-20 DIAGNOSIS — I712 Thoracic aortic aneurysm, without rupture, unspecified: Secondary | ICD-10-CM

## 2021-01-20 DIAGNOSIS — Z7189 Other specified counseling: Secondary | ICD-10-CM | POA: Diagnosis not present

## 2021-01-20 DIAGNOSIS — Z79899 Other long term (current) drug therapy: Secondary | ICD-10-CM

## 2021-01-20 DIAGNOSIS — I10 Essential (primary) hypertension: Secondary | ICD-10-CM

## 2021-01-20 DIAGNOSIS — I4819 Other persistent atrial fibrillation: Secondary | ICD-10-CM | POA: Diagnosis not present

## 2021-01-20 DIAGNOSIS — E7849 Other hyperlipidemia: Secondary | ICD-10-CM

## 2021-01-20 NOTE — Patient Instructions (Signed)
Medication Instructions:  Your physician recommends that you continue on your current medications as directed. Please refer to the Current Medication list given to you today.  *If you need a refill on your cardiac medications before your next appointment, please call your pharmacy*   Lab Work: None If you have labs (blood work) drawn today and your tests are completely normal, you will receive your results only by: . MyChart Message (if you have MyChart) OR . A paper copy in the mail If you have any lab test that is abnormal or we need to change your treatment, we will call you to review the results.   Testing/Procedures: None   Follow-Up: At [facility], you and your health needs are our priority.  As part of our continuing mission to provide you with exceptional heart care, we have created designated Provider Care Teams.  These Care Teams include your primary Cardiologist (physician) and Advanced Practice Providers (APPs -  Physician Assistants and Nurse Practitioners) who all work together to provide you with the care you need, when you need it.  We recommend signing up for the patient portal called "MyChart".  Sign up information is provided on this After Visit Summary.  MyChart is used to connect with patients for Virtual Visits (Telemedicine).  Patients are able to view lab/test results, encounter notes, upcoming appointments, etc.  Non-urgent messages can be sent to your provider as well.   To learn more about what you can do with MyChart, go to [email or web address].    Your next appointment:   6 month(s)  The format for your next appointment:   In Person  Provider:   You may see Henry W Smith III, MD or one of the following Advanced Practice Providers on your designated Care Team:    Laura Ingold, NP  Jill McDaniel, NP    Other Instructions   

## 2021-01-23 NOTE — Addendum Note (Signed)
Addended by: Loren Racer on: 01/23/2021 02:21 PM   Modules accepted: Orders

## 2021-02-18 DIAGNOSIS — F325 Major depressive disorder, single episode, in full remission: Secondary | ICD-10-CM | POA: Diagnosis not present

## 2021-02-18 DIAGNOSIS — N183 Chronic kidney disease, stage 3 unspecified: Secondary | ICD-10-CM | POA: Diagnosis not present

## 2021-02-18 DIAGNOSIS — Z7901 Long term (current) use of anticoagulants: Secondary | ICD-10-CM | POA: Diagnosis not present

## 2021-02-18 DIAGNOSIS — I10 Essential (primary) hypertension: Secondary | ICD-10-CM | POA: Diagnosis not present

## 2021-02-18 DIAGNOSIS — Z125 Encounter for screening for malignant neoplasm of prostate: Secondary | ICD-10-CM | POA: Diagnosis not present

## 2021-02-18 DIAGNOSIS — I714 Abdominal aortic aneurysm, without rupture: Secondary | ICD-10-CM | POA: Diagnosis not present

## 2021-02-18 DIAGNOSIS — E785 Hyperlipidemia, unspecified: Secondary | ICD-10-CM | POA: Diagnosis not present

## 2021-04-30 ENCOUNTER — Other Ambulatory Visit: Payer: Self-pay

## 2021-04-30 MED ORDER — RIVAROXABAN 20 MG PO TABS: ORAL_TABLET | ORAL | 1 refills | Status: DC

## 2021-04-30 NOTE — Telephone Encounter (Signed)
Pt last saw Dr Tamala Julian 01/20/21, last labs 02/18/21 Creat 1.36 at [place], age 80, weight 158.8kg, CrCl 98.93, based on CrCl pt is on appropriate dosage of Xarelto 20mg  QD.  Will refill rx.

## 2021-06-11 DIAGNOSIS — C44319 Basal cell carcinoma of skin of other parts of face: Secondary | ICD-10-CM | POA: Diagnosis not present

## 2021-06-11 DIAGNOSIS — L570 Actinic keratosis: Secondary | ICD-10-CM | POA: Diagnosis not present

## 2021-06-11 DIAGNOSIS — D225 Melanocytic nevi of trunk: Secondary | ICD-10-CM | POA: Diagnosis not present

## 2021-06-11 DIAGNOSIS — L821 Other seborrheic keratosis: Secondary | ICD-10-CM | POA: Diagnosis not present

## 2021-06-11 DIAGNOSIS — Z85828 Personal history of other malignant neoplasm of skin: Secondary | ICD-10-CM | POA: Diagnosis not present

## 2021-06-11 DIAGNOSIS — D1801 Hemangioma of skin and subcutaneous tissue: Secondary | ICD-10-CM | POA: Diagnosis not present

## 2021-06-11 DIAGNOSIS — L814 Other melanin hyperpigmentation: Secondary | ICD-10-CM | POA: Diagnosis not present

## 2021-06-25 DIAGNOSIS — G4700 Insomnia, unspecified: Secondary | ICD-10-CM | POA: Diagnosis not present

## 2021-06-25 DIAGNOSIS — Z0000 Encounter for general adult medical examination without abnormal findings: Secondary | ICD-10-CM | POA: Diagnosis not present

## 2021-06-25 DIAGNOSIS — Z1159 Encounter for screening for other viral diseases: Secondary | ICD-10-CM | POA: Diagnosis not present

## 2021-06-25 DIAGNOSIS — N183 Chronic kidney disease, stage 3 unspecified: Secondary | ICD-10-CM | POA: Diagnosis not present

## 2021-06-25 DIAGNOSIS — I10 Essential (primary) hypertension: Secondary | ICD-10-CM | POA: Diagnosis not present

## 2021-06-25 DIAGNOSIS — Z1389 Encounter for screening for other disorder: Secondary | ICD-10-CM | POA: Diagnosis not present

## 2021-06-25 DIAGNOSIS — F325 Major depressive disorder, single episode, in full remission: Secondary | ICD-10-CM | POA: Diagnosis not present

## 2021-06-25 DIAGNOSIS — I480 Paroxysmal atrial fibrillation: Secondary | ICD-10-CM | POA: Diagnosis not present

## 2021-06-25 DIAGNOSIS — I714 Abdominal aortic aneurysm, without rupture: Secondary | ICD-10-CM | POA: Diagnosis not present

## 2021-06-25 DIAGNOSIS — F419 Anxiety disorder, unspecified: Secondary | ICD-10-CM | POA: Diagnosis not present

## 2021-07-11 DIAGNOSIS — I10 Essential (primary) hypertension: Secondary | ICD-10-CM | POA: Diagnosis not present

## 2021-07-25 ENCOUNTER — Other Ambulatory Visit: Payer: Self-pay | Admitting: Interventional Cardiology

## 2021-07-25 NOTE — Telephone Encounter (Signed)
Prescription refill request for Xarelto received.   Indication: afib  Last office visit: Tamala Julian 01/20/2021 Weight: 158.8 kg  Age: 80 yo  Scr: 1.28, 07/11/2021 CrCl: 181m/min   Pt is on the correct dose of Xarelto per dosing criteria, prescription refill

## 2021-08-05 ENCOUNTER — Other Ambulatory Visit: Payer: Self-pay | Admitting: Interventional Cardiology

## 2021-09-20 NOTE — Progress Notes (Signed)
Cardiology Office Note:    Date:  09/22/2021   ID:  Joseph [name], DOB [birth year], MRN [id number]  PCP:  Carol Ada, MD  Cardiologist:  [name], MD   Referring MD: Carol Ada, MD   No chief complaint on file.   History of Present Illness:    Joseph [name] is a 80 y.o. male with a hx of paroxysmal atrial fibrillation, history of atrial fibrillation ablation, amiodarone therapy for maintenance of sinus rhythm, chronic anticoagulation therapy, ascending aortic aneurysm measuring 4.8 cm and followed by Dr. Cyndia Bent, obesity, and hypertension.  There are no cardiac complaints.  He denies angina.  Not as physically active as he would like to be or as we will like for him to be.  He denies angina pectoris.  Aortic aneurysm follow-up has not occurred since November 2020 after the onset of the pandemic.  No chest pain or other symptoms.  Past Medical History:  Diagnosis Date   Allergy    Atrial fibrillation ([place]) 11/2008   Atrial fibrillation ([place])    Atypical atrial flutter ([place]) 07/13/2014   Diagnosis on EKG 07/13/14 : Cardioversion August 2015   Colon polyp    Depression    Diverticulosis    Dysrhythmia    GERD (gastroesophageal reflux disease)    Headache(784.0)    Hyperlipidemia    Hypertension    Panic attacks    PONV (postoperative nausea and vomiting)    Thoracic aneurysm     Past Surgical History:  Procedure Laterality Date   ablataion  07/06/2012   APPENDECTOMY  1958   CARDIAC CATHETERIZATION  2009   CARDIOVERSION  03/18/2012   Procedure: CARDIOVERSION;  Surgeon: [name], MD;  Location: [facility];  Service: Cardiovascular;  Laterality: N/A;   CARDIOVERSION N/A 08/23/2014   Procedure: CARDIOVERSION;  Surgeon: [name], MD;  Location: [facility];  Service: Cardiovascular;  Laterality: N/A;   INGUINAL HERNIA REPAIR     right   LUMBAR LAMINECTOMY/DECOMPRESSION MICRODISCECTOMY Bilateral 02/08/2015   Procedure: Laminectomy and  Foraminotomy - bilateral - Lumbar two-three, three-four, left lumbar four-five with resection of synovial cyst ;  Surgeon: Charlie Pitter, MD;  Location: [facility];  Service: Neurosurgery;  Laterality: Bilateral;   spinal tap     VASECTOMY      Current Medications: Current Meds  Medication Sig   acetaminophen (TYLENOL) 500 MG tablet Take 500 mg by mouth every 6 (six) hours as needed (pain).   ALPRAZolam (XANAX) 0.5 MG tablet Take 0.5-1 mg by mouth 3 (three) times daily as needed for anxiety or sleep.    amiodarone (PACERONE) 200 MG tablet Take 200 mg by mouth daily.   doxycycline (VIBRA-TABS) 100 MG tablet Take 100 mg by mouth 2 (two) times daily.   hydrochlorothiazide (MICROZIDE) 12.5 MG capsule TAKE 1 CAPSULE(12.5 MG) BY MOUTH DAILY (Patient taking differently: 25 mg daily.)   metoprolol succinate (TOPROL-XL) 100 MG 24 hr tablet Take 100 mg by mouth daily. Take with or immediately following a meal.   olmesartan (BENICAR) 40 MG tablet Take 40 mg by mouth daily.   pantoprazole (PROTONIX) 40 MG tablet Take 40 mg by mouth daily.   potassium chloride SA (K-DUR,KLOR-CON) 20 MEQ tablet Take 20 mEq by mouth daily.   rosuvastatin (CRESTOR) 20 MG tablet TAKE 1 TABLET(20 MG) BY MOUTH DAILY   sertraline (ZOLOFT) 100 MG tablet Take 100 mg by mouth daily.   XARELTO 20 MG TABS tablet TAKE 1 TABLET(20  MG) BY MOUTH DAILY WITH SUPPER     Allergies:   Patient has no known allergies.   Social History   Socioeconomic History   Marital status: Married    Spouse name: Jarius Dieudonne   Number of children: 2   Years of education: College   Highest education level: Not on file  Occupational History   Occupation: Retired  Tobacco Use   Smoking status: Never   Smokeless tobacco: Never  Vaping Use   Vaping Use: Never used  Substance and Sexual Activity   Alcohol use: No   Drug use: No   Sexual activity: Not on file  Other Topics Concern   Not on file  Social History Narrative   Patient lives  at home with spouse.   Caffeine Use: 1 cup daily   Social Determinants of Health   Financial Resource Strain: Not on file  Food Insecurity: Not on file  Transportation Needs: Not on file  Physical Activity: Not on file  Stress: Not on file  Social Connections: Not on file     Family History: The patient's family history includes Heart attack in his brother and mother; Heart disease in his father and maternal grandfather; Heart disease (age of onset: 51) in his mother; Kidney disease (age of onset: 57) in his brother; Lung cancer in his father. There is no history of Colon cancer.  ROS:   Please see the history of present illness.    No blood in the urine or stool.  Easy bruising has been occurring with skin trauma.  All other systems reviewed and are negative.  EKGs/Labs/Other Studies Reviewed:    The following studies were reviewed today:  AORTIC CT ANGIOGRAM 2020:  IMPRESSION: 1. Stable 4.8 cm ascending thoracic aortic aneurysm. Aortic aneurysm NOS (ICD10-I71.9). Ascending thoracic aortic aneurysm. Recommend semi-annual imaging followup by CTA or MRA and referral to cardiothoracic surgery if not already obtained. This recommendation follows 2010 ACCF/AHA/AATS/ACR/ASA/SCA/SCAI/SIR/STS/SVM Guidelines for the Diagnosis and Management of Patients With Thoracic Aortic Disease. Circulation. 2010; 121: L875-I433. Aortic aneurysm NOS (ICD10-I71.9).   2.  Colonic diverticulosis.   3.  1.2 cm hypodense left thyroid nodule.  EKG:  EKG performed January 2022 demonstrated rhythm with first-degree AV block.  Exam is compatible with sinus rhythm today.  Recent Labs: No results found for requested labs within last 8760 hours.  Recent Lipid Panel    Component Value Date/Time   CHOL 151 11/01/2019 0758   TRIG 171 (H) 11/01/2019 0758   HDL 55 11/01/2019 0758   CHOLHDL 2.7 11/01/2019 0758   CHOLHDL 3 05/13/2011 0900   VLDL 17.4 05/13/2011 0900   LDLCALC 67 11/01/2019 0758     Physical Exam:    VS:  BP 124/72   Pulse 65   Ht 5\' 9"  (1.753 m)   Wt (!) 349 lb 6.4 oz (158.5 kg)   SpO2 95%   BMI 51.60 kg/m     Wt Readings from Last 3 Encounters:  09/22/21 (!) 349 lb 6.4 oz (158.5 kg)  01/20/21 (!) 350 lb (158.8 kg)  06/21/20 (!) 342 lb 12.8 oz (155.5 kg)     GEN: Morbid obesity with BMI 51. No acute distress HEENT: Normal NECK: No JVD. LYMPHATICS: No lymphadenopathy CARDIAC: No murmur. RRR no gallop, or edema. VASCULAR:  Normal Pulses. No bruits. RESPIRATORY:  Clear to auscultation without rales, wheezing or rhonchi  ABDOMEN: Soft, non-tender, non-distended, No pulsatile mass, MUSCULOSKELETAL: No deformity  SKIN: Warm and dry NEUROLOGIC:  Alert and  oriented x 3 PSYCHIATRIC:  Normal affect   ASSESSMENT:    1. On amiodarone therapy   2. Persistent atrial fibrillation ([place])   3. Essential hypertension   4. Thoracic aortic aneurysm without rupture ([place])   5. Chronic anticoagulation   6. Other hyperlipidemia    PLAN:    In order of problems listed above:  Check TSH today.  Lipid panel was done in June and was unremarkable.  No obvious toxicity from amiodarone. Maintaining sinus rhythm on amiodarone.  He has had prior ablation. Excellent control.  Target less than 140/80 mmHg now that he is 80 years of age.  Continue hydrochlorothiazide 25 mg/day, Toprol-XL 100 mg/day, Benicar 40 mg/day. CT angio of the aorta will be performed.  Basic metabolic panel CBC and TSH will be done today. CBC and kidney function today. Continue high intensity Crestor 20 mg/day.  Target BP: <140/80 mmHg  Diet and lifestyle measures for BP control were reviewed in detail: Low sodium diet (<2.5 gm daily); alcohol restriction (<3 ounces per day); weight loss (Mediterranean); avoid non-steroidal agents; > 6 hours sleep per day; 150 min moderate exercise per week. Medical regimen will include at least 2 agents. Resistant hypertension if not controlled on 3 agents.  Consider further evaluation: Sleep study to r/o OSA; Renal angiogram; Primary hyperaldonism and Pheochromocytoma w/u. After 3 agents, consider MRA (spironolactone)/ Epleronone), hydralazine, beta-blocker, and Minoxidil if not already in use due to patient profile.    Medication Adjustments/Labs and Tests Ordered: Current medicines are reviewed at length with the patient today.  Concerns regarding medicines are outlined above.  Orders Placed This Encounter  Procedures   CT ANGIO CHEST AORTA W/CM & OR WO/CM   Basic metabolic panel   CBC   No orders of the defined types were placed in this encounter.   Patient Instructions  Medication Instructions:  Your physician recommends that you continue on your current medications as directed. Please refer to the Current Medication list given to you today.  *If you need a refill on your cardiac medications before your next appointment, please call your pharmacy*   Lab Work: BMET and CBC today  If you have labs (blood work) drawn today and your tests are completely normal, you will receive your results only by: [place] (if you have MyChart) OR A paper copy in the mail If you have any lab test that is abnormal or we need to change your treatment, we will call you to review the results.   Testing/Procedures: Your physician recommends that you have a Chest CT of the Aorta performed.    Follow-Up: At [facility], you and your health needs are our priority.  As part of our continuing mission to provide you with exceptional heart care, we have created designated Provider Care Teams.  These Care Teams include your primary Cardiologist (physician) and Advanced Practice Providers (APPs -  Physician Assistants and Nurse Practitioners) who all work together to provide you with the care you need, when you need it.  We recommend signing up for the patient portal called "MyChart".  Sign up information is provided on this After Visit Summary.   MyChart is used to connect with patients for Virtual Visits (Telemedicine).  Patients are able to view lab/test results, encounter notes, upcoming appointments, etc.  Non-urgent messages can be sent to your provider as well.   To learn more about what you can do with MyChart, go to [email or web address].    Your next appointment:   6-9 month(s)  The format for your next appointment:   In Person  Provider:   You may see [name], MD or one of the following Advanced Practice Providers on your designated Care Team:   Cecilie Kicks, NP   Other Instructions     Signed, [name], MD  09/22/2021 8:41 AM    [place]

## 2021-09-22 ENCOUNTER — Ambulatory Visit: Payer: Medicare PPO | Admitting: Interventional Cardiology

## 2021-09-22 ENCOUNTER — Encounter: Payer: Self-pay | Admitting: Interventional Cardiology

## 2021-09-22 ENCOUNTER — Other Ambulatory Visit: Payer: Self-pay

## 2021-09-22 VITALS — BP 124/72 | HR 65 | Ht 69.0 in | Wt 349.4 lb

## 2021-09-22 DIAGNOSIS — Z7901 Long term (current) use of anticoagulants: Secondary | ICD-10-CM | POA: Diagnosis not present

## 2021-09-22 DIAGNOSIS — E7849 Other hyperlipidemia: Secondary | ICD-10-CM

## 2021-09-22 DIAGNOSIS — I4819 Other persistent atrial fibrillation: Secondary | ICD-10-CM

## 2021-09-22 DIAGNOSIS — I10 Essential (primary) hypertension: Secondary | ICD-10-CM | POA: Diagnosis not present

## 2021-09-22 DIAGNOSIS — Z79899 Other long term (current) drug therapy: Secondary | ICD-10-CM

## 2021-09-22 DIAGNOSIS — I712 Thoracic aortic aneurysm, without rupture, unspecified: Secondary | ICD-10-CM

## 2021-09-22 LAB — BASIC METABOLIC PANEL
BUN/Creatinine Ratio: 11 (ref 10–24)
BUN: 14 mg/dL (ref 8–27)
CO2: 23 mmol/L (ref 20–29)
Calcium: 8.9 mg/dL (ref 8.6–10.2)
Chloride: 99 mmol/L (ref 96–106)
Creatinine, Ser: 1.23 mg/dL (ref 0.76–1.27)
Glucose: 104 mg/dL — ABNORMAL HIGH (ref 65–99)
Potassium: 4.5 mmol/L (ref 3.5–5.2)
Sodium: 137 mmol/L (ref 134–144)
eGFR: 59 mL/min/{1.73_m2} — ABNORMAL LOW (ref >59)

## 2021-09-22 LAB — CBC
Hematocrit: 38.9 % (ref 37.5–51.0)
Hemoglobin: 12.4 g/dL — ABNORMAL LOW (ref 13.0–17.7)
MCH: 26.8 pg (ref 26.6–33.0)
MCHC: 31.9 g/dL (ref 31.5–35.7)
MCV: 84 fL (ref 79–97)
Platelets: 270 10*3/uL (ref 150–450)
RBC: 4.63 x10E6/uL (ref 4.14–5.80)
RDW: 14.8 % (ref 11.6–15.4)
WBC: 6.6 10*3/uL (ref 3.4–10.8)

## 2021-09-22 LAB — TSH: TSH: 4.29 u[IU]/mL (ref 0.450–4.500)

## 2021-09-22 NOTE — Patient Instructions (Addendum)
Medication Instructions:  Your physician recommends that you continue on your current medications as directed. Please refer to the Current Medication list given to you today.  *If you need a refill on your cardiac medications before your next appointment, please call your pharmacy*   Lab Work: BMET, TSH and CBC today  If you have labs (blood work) drawn today and your tests are completely normal, you will receive your results only by: [place] (if you have MyChart) OR A paper copy in the mail If you have any lab test that is abnormal or we need to change your treatment, we will call you to review the results.   Testing/Procedures: Your physician recommends that you have a Chest CT of the Aorta performed.    Follow-Up: At [facility], you and your health needs are our priority.  As part of our continuing mission to provide you with exceptional heart care, we have created designated Provider Care Teams.  These Care Teams include your primary Cardiologist (physician) and Advanced Practice Providers (APPs -  Physician Assistants and Nurse Practitioners) who all work together to provide you with the care you need, when you need it.  We recommend signing up for the patient portal called "MyChart".  Sign up information is provided on this After Visit Summary.  MyChart is used to connect with patients for Virtual Visits (Telemedicine).  Patients are able to view lab/test results, encounter notes, upcoming appointments, etc.  Non-urgent messages can be sent to your provider as well.   To learn more about what you can do with MyChart, go to [email or web address].    Your next appointment:   6-9 month(s)  The format for your next appointment:   In Person  Provider:   You may see [name], MD or one of the following Advanced Practice Providers on your designated Care Team:   Cecilie Kicks, NP   Other Instructions

## 2021-10-03 ENCOUNTER — Ambulatory Visit (INDEPENDENT_AMBULATORY_CARE_PROVIDER_SITE_OTHER)
Admission: RE | Admit: 2021-10-03 | Discharge: 2021-10-03 | Disposition: A | Discharge status: Home / Self Care | Payer: Medicare PPO | Source: Ambulatory Visit | Attending: Interventional Cardiology | Admitting: Interventional Cardiology

## 2021-10-03 ENCOUNTER — Other Ambulatory Visit: Payer: Self-pay

## 2021-10-03 DIAGNOSIS — I712 Thoracic aortic aneurysm, without rupture, unspecified: Secondary | ICD-10-CM

## 2021-10-03 DIAGNOSIS — K449 Diaphragmatic hernia without obstruction or gangrene: Secondary | ICD-10-CM | POA: Diagnosis not present

## 2021-10-03 DIAGNOSIS — I7121 Aneurysm of the ascending aorta, without rupture: Secondary | ICD-10-CM | POA: Diagnosis not present

## 2021-10-03 MED ORDER — IOHEXOL 350 MG/ML SOLN
100.0000 mL | Freq: Once | INTRAVENOUS | Status: AC | PRN
  Administered 2021-10-03: 100 mL via INTRAVENOUS

## 2021-10-22 DIAGNOSIS — M5416 Radiculopathy, lumbar region: Secondary | ICD-10-CM | POA: Diagnosis not present

## 2021-10-23 ENCOUNTER — Other Ambulatory Visit: Payer: Self-pay | Admitting: Neurosurgery

## 2021-10-23 DIAGNOSIS — M5416 Radiculopathy, lumbar region: Secondary | ICD-10-CM

## 2021-10-29 ENCOUNTER — Other Ambulatory Visit: Payer: Self-pay | Admitting: Interventional Cardiology

## 2021-11-09 ENCOUNTER — Ambulatory Visit
Admission: RE | Admit: 2021-11-09 | Discharge: 2021-11-09 | Disposition: A | Discharge status: Home / Self Care | Payer: Medicare PPO | Source: Ambulatory Visit | Attending: Neurosurgery | Admitting: Neurosurgery

## 2021-11-09 ENCOUNTER — Other Ambulatory Visit: Payer: Self-pay

## 2021-11-09 DIAGNOSIS — M5126 Other intervertebral disc displacement, lumbar region: Secondary | ICD-10-CM | POA: Diagnosis not present

## 2021-11-09 DIAGNOSIS — M5136 Other intervertebral disc degeneration, lumbar region: Secondary | ICD-10-CM | POA: Diagnosis not present

## 2021-11-09 DIAGNOSIS — M5416 Radiculopathy, lumbar region: Secondary | ICD-10-CM

## 2021-11-09 DIAGNOSIS — M4807 Spinal stenosis, lumbosacral region: Secondary | ICD-10-CM | POA: Diagnosis not present

## 2021-11-09 DIAGNOSIS — M48061 Spinal stenosis, lumbar region without neurogenic claudication: Secondary | ICD-10-CM | POA: Diagnosis not present

## 2021-11-09 MED ORDER — GADOBENATE DIMEGLUMINE 529 MG/ML IV SOLN
20.0000 mL | Freq: Once | INTRAVENOUS | Status: AC | PRN
  Administered 2021-11-09: 20 mL via INTRAVENOUS

## 2021-11-13 ENCOUNTER — Telehealth: Payer: Self-pay | Admitting: *Deleted

## 2021-11-13 DIAGNOSIS — M5416 Radiculopathy, lumbar region: Secondary | ICD-10-CM | POA: Diagnosis not present

## 2021-11-13 NOTE — Telephone Encounter (Signed)
   Primary Cardiologist: [name], MD  Chart reviewed as part of pre-operative protocol coverage. Given past medical history and time since last visit, based on ACC/AHA guidelines, Joseph [name] would be at acceptable risk for the planned procedure without further cardiovascular testing.   Patient with diagnosis of A fib on Xarelto for anticoagulation.     Procedure:  [id number] Date of procedure: 12/01/21                                  CHA2DS2-VASc Score = 4  his indicates a 4.8% annual risk of stroke. The patient's score is based upon: CHF History: 0 HTN History: 1 Diabetes History: 0 Stroke History: 0 Vascular Disease History: 1 Age Score: 2 Gender Score: 0    CrCl 72 mL/min using adjusted body weight Platelet count 270K     Per office protocol, patient can hold Xarelto for 3 days prior to procedure.  I will route this recommendation to the requesting party via Epic fax function and remove from pre-op pool.  Please call with questions.  Jossie Ng. Cynthia Stainback NP-C    11/13/2021, 1:49 PM [place] [address] Office [phone] Fax [phone]

## 2021-11-13 NOTE — Telephone Encounter (Signed)
   Pre-operative Risk Assessment    Patient Name: Joseph [name]  DOB: 04-Sep-1941 MRN: [id number]      Request for Surgical Clearance   Procedure:   [id number]  Date of Surgery: Clearance 12/01/21                                Surgeon:  DR. DAVE [facility] Surgeon's Group or Practice Name:  [facility]  Phone number:  [phone] Fax number:  [phone]   Type of Clearance Requested: - Medical  - Pharmacy:  Hold Rivaroxaban (Xarelto) x 3 DAYS PRIOR TO PROCEDURE  RESUME DAY AFTER INJECTION   Type of Anesthesia:   Not Indicated   Additional requests/questions:   Jiles Prows   11/13/2021, 12:54 PM

## 2021-11-13 NOTE — Telephone Encounter (Signed)
Patient with diagnosis of A fib on Xarelto for anticoagulation.    Procedure:  [id number] Date of procedure: 12/01/21                                CHA2DS2-VASc Score = 4  his indicates a 4.8% annual risk of stroke. The patient's score is based upon: CHF History: 0 HTN History: 1 Diabetes History: 0 Stroke History: 0 Vascular Disease History: 1 Age Score: 2 Gender Score: 0   CrCl 72 mL/min using adjusted body weight Platelet count 270K   Per office protocol, patient can hold Xarelto for 3 days prior to procedure.

## 2021-12-01 DIAGNOSIS — M5416 Radiculopathy, lumbar region: Secondary | ICD-10-CM | POA: Diagnosis not present

## 2022-01-04 ENCOUNTER — Encounter: Payer: Self-pay | Admitting: Gastroenterology

## 2022-01-28 DIAGNOSIS — N183 Chronic kidney disease, stage 3 unspecified: Secondary | ICD-10-CM | POA: Diagnosis not present

## 2022-01-28 DIAGNOSIS — I714 Abdominal aortic aneurysm, without rupture, unspecified: Secondary | ICD-10-CM | POA: Diagnosis not present

## 2022-01-28 DIAGNOSIS — I10 Essential (primary) hypertension: Secondary | ICD-10-CM | POA: Diagnosis not present

## 2022-01-28 DIAGNOSIS — D6869 Other thrombophilia: Secondary | ICD-10-CM | POA: Diagnosis not present

## 2022-01-28 DIAGNOSIS — I480 Paroxysmal atrial fibrillation: Secondary | ICD-10-CM | POA: Diagnosis not present

## 2022-01-28 DIAGNOSIS — F325 Major depressive disorder, single episode, in full remission: Secondary | ICD-10-CM | POA: Diagnosis not present

## 2022-01-28 DIAGNOSIS — E785 Hyperlipidemia, unspecified: Secondary | ICD-10-CM | POA: Diagnosis not present

## 2022-01-30 DIAGNOSIS — L570 Actinic keratosis: Secondary | ICD-10-CM | POA: Diagnosis not present

## 2022-01-30 DIAGNOSIS — Z85828 Personal history of other malignant neoplasm of skin: Secondary | ICD-10-CM | POA: Diagnosis not present

## 2022-01-30 DIAGNOSIS — L821 Other seborrheic keratosis: Secondary | ICD-10-CM | POA: Diagnosis not present

## 2022-01-30 DIAGNOSIS — L814 Other melanin hyperpigmentation: Secondary | ICD-10-CM | POA: Diagnosis not present

## 2022-01-30 DIAGNOSIS — D1801 Hemangioma of skin and subcutaneous tissue: Secondary | ICD-10-CM | POA: Diagnosis not present

## 2022-02-02 DIAGNOSIS — M5416 Radiculopathy, lumbar region: Secondary | ICD-10-CM | POA: Diagnosis not present

## 2022-02-25 DIAGNOSIS — M48062 Spinal stenosis, lumbar region with neurogenic claudication: Secondary | ICD-10-CM | POA: Diagnosis not present

## 2022-02-25 DIAGNOSIS — M5416 Radiculopathy, lumbar region: Secondary | ICD-10-CM | POA: Diagnosis not present

## 2022-02-25 DIAGNOSIS — M47816 Spondylosis without myelopathy or radiculopathy, lumbar region: Secondary | ICD-10-CM | POA: Diagnosis not present

## 2022-02-25 DIAGNOSIS — R030 Elevated blood-pressure reading, without diagnosis of hypertension: Secondary | ICD-10-CM | POA: Diagnosis not present

## 2022-03-15 NOTE — Progress Notes (Signed)
?Cardiology Office Note:   ? ?Date:  03/16/2022  ? ?ID:  Joseph [name], DOB 1941/06/05, MRN [id number] ? ?PCP:  Carol Ada, MD  ?Cardiologist:  [name], MD  ? ?Referring MD: Carol Ada, MD  ? ?Chief Complaint  ?Patient presents with  ? Coronary Artery Disease  ? Atrial Fibrillation  ? Hypertension  ? Follow-up  ?  Obesity  ? ? ?History of Present Illness:   ? ?Joseph [name] is a 81 y.o. male with a hx of paroxysmal atrial fibrillation, history of atrial fibrillation ablation, amiodarone therapy for maintenance of sinus rhythm, chronic anticoagulation therapy, ascending aortic aneurysm measuring 4.8 cm and followed by Dr. Cyndia [name], obesity, and hypertension. ? ?He was doing well when last seen in September 2022.  He remains on amiodarone therapy. ? ?He is walking with an assistive device.  It helps him keep his balance.  Has a lot of back discomfort.  Denies any cardiac complaints.  Says he does not feel any episodes of atrial fibrillation.  No bleeding on Xarelto.  Recent laboratory data did not demonstrate any evidence of thyroid or liver impairment.  Upcoming blood work with Dr. Carol Ada later this spring. ? ?Past Medical History:  ?Diagnosis Date  ? Allergy   ? Atrial fibrillation ([place]) 11/2008  ? Atrial fibrillation ([place])   ? Atypical atrial flutter ([place]) 07/13/2014  ? Diagnosis on EKG 07/13/14 : Cardioversion August 2015  ? Colon polyp   ? Depression   ? Diverticulosis   ? Dysrhythmia   ? GERD (gastroesophageal reflux disease)   ? Headache(784.0)   ? Hyperlipidemia   ? Hypertension   ? Panic attacks   ? PONV (postoperative nausea and vomiting)   ? Thoracic aneurysm   ? ? ?Past Surgical History:  ?Procedure Laterality Date  ? ablataion  07/06/2012  ? APPENDECTOMY  1958  ? CARDIAC CATHETERIZATION  2009  ? CARDIOVERSION  03/18/2012  ? Procedure: CARDIOVERSION;  Surgeon: [name], MD;  Location: [facility];  Service: Cardiovascular;  Laterality: N/A;  ? CARDIOVERSION N/A 08/23/2014  ?  Procedure: CARDIOVERSION;  Surgeon: [name], MD;  Location: [facility];  Service: Cardiovascular;  Laterality: N/A;  ? INGUINAL HERNIA REPAIR    ? right  ? LUMBAR LAMINECTOMY/DECOMPRESSION MICRODISCECTOMY Bilateral 02/08/2015  ? Procedure: Laminectomy and Foraminotomy - bilateral - Lumbar two-three, three-four, left lumbar four-five with resection of synovial cyst ;  Surgeon: Charlie Pitter, MD;  Location: [facility];  Service: Neurosurgery;  Laterality: Bilateral;  ? spinal tap    ? VASECTOMY    ? ? ?Current Medications: ?Current Meds  ?Medication Sig  ? acetaminophen (TYLENOL) 500 MG tablet Take 500 mg by mouth every 6 (six) hours as needed (pain).  ? ALPRAZolam (XANAX) 0.5 MG tablet Take 0.5-1 mg by mouth 3 (three) times daily as needed for anxiety or sleep.   ? amiodarone (PACERONE) 200 MG tablet Take 200 mg by mouth daily.  ? doxycycline (VIBRA-TABS) 100 MG tablet Take 100 mg by mouth 2 (two) times daily.  ? hydrochlorothiazide (HYDRODIURIL) 25 MG tablet Take 25 mg by mouth daily.  ? metoprolol succinate (TOPROL-XL) 100 MG 24 hr tablet Take 100 mg by mouth daily. Take with or immediately following a meal.  ? olmesartan (BENICAR) 40 MG tablet Take 40 mg by mouth daily.  ? pantoprazole (PROTONIX) 40 MG tablet Take 40 mg by mouth daily.  ? potassium chloride SA (K-DUR,KLOR-CON) 20 MEQ tablet Take 20  mEq by mouth daily.  ? rosuvastatin (CRESTOR) 20 MG tablet TAKE 1 TABLET(20 MG) BY MOUTH DAILY  ? sertraline (ZOLOFT) 100 MG tablet Take 100 mg by mouth daily.  ? XARELTO 20 MG TABS tablet TAKE 1 TABLET(20 MG) BY MOUTH DAILY WITH SUPPER  ?  ? ?Allergies:   Patient has no known allergies.  ? ?Social History  ? ?Socioeconomic History  ? Marital status: Married  ?  Spouse name: Joseph [name]  ? Number of children: 2  ? Years of education: College  ? Highest education level: Not on file  ?Occupational History  ? Occupation: Retired  ?Tobacco Use  ? Smoking status: Never  ? Smokeless tobacco: Never  ?Vaping  Use  ? Vaping Use: Never used  ?Substance and Sexual Activity  ? Alcohol use: No  ? Drug use: No  ? Sexual activity: Not on file  ?Other Topics Concern  ? Not on file  ?Social History Narrative  ? Patient lives at home with spouse.  ? Caffeine Use: 1 cup daily  ? ?Social Determinants of Health  ? ?Financial Resource Strain: Not on file  ?Food Insecurity: Not on file  ?Transportation Needs: Not on file  ?Physical Activity: Not on file  ?Stress: Not on file  ?Social Connections: Not on file  ?  ? ?Family History: ?The patient's family history includes Heart attack in his brother and mother; Heart disease in his father and maternal grandfather; Heart disease (age of onset: 62) in his mother; Kidney disease (age of onset: 53) in his brother; Lung cancer in his father. There is no history of Colon cancer. ? ?ROS:   ?Please see the history of present illness.    ?Back pain.  Otherwise no complaints.  All other systems reviewed and are negative. ? ?EKGs/Labs/Other Studies Reviewed:   ? ?The following studies were reviewed today: ?No recent or new imaging ? ?EKG:  EKG sinus bradycardia, baseline artifact, low voltage.  When compared to 2022 first-degree AV block continues to be present ? ?Recent Labs: ?09/22/2021: BUN 14; Creatinine, Ser 1.23; Hemoglobin 12.4; Platelets 270; Potassium 4.5; Sodium 137; TSH 4.290  ?Recent Lipid Panel ?   ?Component Value Date/Time  ? CHOL 151 11/01/2019 0758  ? TRIG 171 (H) 11/01/2019 0758  ? HDL 55 11/01/2019 0758  ? CHOLHDL 2.7 11/01/2019 0758  ? CHOLHDL 3 05/13/2011 0900  ? VLDL 17.4 05/13/2011 0900  ? [place] 67 11/01/2019 0758  ? ? ?Physical Exam:   ? ?VS:  BP 118/78   Pulse (!) 58   Ht '5\' 9"'$  (1.753 m)   Wt (!) 340 lb 9.6 oz (154.5 kg)   SpO2 98%   BMI 50.30 kg/m?    ? ?Wt Readings from Last 3 Encounters:  ?03/16/22 (!) 340 lb 9.6 oz (154.5 kg)  ?09/22/21 (!) 349 lb 6.4 oz (158.5 kg)  ?01/20/21 (!) 350 lb (158.8 kg)  ?  ? ?GEN: Morbid obesity. No acute distress ?HEENT: Normal ?NECK:  No JVD. ?LYMPHATICS: No lymphadenopathy ?CARDIAC: No murmur. RRR no gallop, or edema. ?VASCULAR:  Normal Pulses. No bruits. ?RESPIRATORY:  Clear to auscultation without rales, wheezing or rhonchi  ?ABDOMEN: Soft, non-tender, non-distended, No pulsatile mass, ?MUSCULOSKELETAL: No deformity  ?SKIN: Warm and dry ?NEUROLOGIC:  Alert and oriented x 3 ?PSYCHIATRIC:  Normal affect  ? ?ASSESSMENT:   ? ?1. Persistent atrial fibrillation ([place])   ?2. On amiodarone therapy   ?3. Essential hypertension   ?4. Aneurysm of ascending aorta without rupture   ?  5. Acquired thrombophilia ([place])   ?6. Other hyperlipidemia   ? ?PLAN:   ? ?In order of problems listed above: ? ?Maintaining normal sinus rhythm on amiodarone.  No episodes that he can tell based upon symptoms. ?Continue amiodarone at current dose.  TSH, chest x-ray, liver panel needs to be followed at least once per year. ?Blood pressure control is excellent. ?Still followed by Dr. Caffie Pinto concerning a stable 4.8 cm ascending aortic aneurysm. ?Continue Xarelto therapy.  Notify us bleeding. ?Continue statin therapy. ? ?Overall education and awareness concerning primary/secondary risk prevention was discussed in detail: LDL less than 70, hemoglobin A1c less than 7, blood pressure target less than 130/80 mmHg, >150 minutes of moderate aerobic activity per week, avoidance of smoking, weight control (via diet and exercise), and continued surveillance/management of/for obstructive sleep apnea. ? ? ? ?Medication Adjustments/Labs and Tests Ordered: ?Current medicines are reviewed at length with the patient today.  Concerns regarding medicines are outlined above.  ?No orders of the defined types were placed in this encounter. ? ?No orders of the defined types were placed in this encounter. ? ? ?Patient Instructions  ?Medication Instructions:  ?Your physician recommends that you continue on your current medications as directed. Please refer to the Current Medication list given to you  today. ? ?*If you need a refill on your cardiac medications before your next appointment, please call your pharmacy* ? ? ?Lab Work: ?None ?If you have labs (blood work) drawn today and your tests are completely normal,

## 2022-03-16 ENCOUNTER — Other Ambulatory Visit: Payer: Self-pay

## 2022-03-16 ENCOUNTER — Ambulatory Visit: Payer: Medicare PPO | Admitting: Interventional Cardiology

## 2022-03-16 ENCOUNTER — Encounter: Payer: Self-pay | Admitting: Interventional Cardiology

## 2022-03-16 VITALS — BP 118/78 | HR 58 | Ht 69.0 in | Wt 340.6 lb

## 2022-03-16 DIAGNOSIS — I4819 Other persistent atrial fibrillation: Secondary | ICD-10-CM | POA: Diagnosis not present

## 2022-03-16 DIAGNOSIS — D6869 Other thrombophilia: Secondary | ICD-10-CM

## 2022-03-16 DIAGNOSIS — Z79899 Other long term (current) drug therapy: Secondary | ICD-10-CM | POA: Diagnosis not present

## 2022-03-16 DIAGNOSIS — E7849 Other hyperlipidemia: Secondary | ICD-10-CM | POA: Diagnosis not present

## 2022-03-16 DIAGNOSIS — I10 Essential (primary) hypertension: Secondary | ICD-10-CM | POA: Diagnosis not present

## 2022-03-16 DIAGNOSIS — I7121 Aneurysm of the ascending aorta, without rupture: Secondary | ICD-10-CM | POA: Diagnosis not present

## 2022-03-16 NOTE — Patient Instructions (Signed)
Medication Instructions:  Your physician recommends that you continue on your current medications as directed. Please refer to the Current Medication list given to you today.  *If you need a refill on your cardiac medications before your next appointment, please call your pharmacy*   Lab Work: None If you have labs (blood work) drawn today and your tests are completely normal, you will receive your results only by: MyChart Message (if you have MyChart) OR A paper copy in the mail If you have any lab test that is abnormal or we need to change your treatment, we will call you to review the results.   Testing/Procedures: None   Follow-Up: At [facility], you and your health needs are our priority.  As part of our continuing mission to provide you with exceptional heart care, we have created designated Provider Care Teams.  These Care Teams include your primary Cardiologist (physician) and Advanced Practice Providers (APPs -  Physician Assistants and Nurse Practitioners) who all work together to provide you with the care you need, when you need it.  We recommend signing up for the patient portal called "MyChart".  Sign up information is provided on this After Visit Summary.  MyChart is used to connect with patients for Virtual Visits (Telemedicine).  Patients are able to view lab/test results, encounter notes, upcoming appointments, etc.  Non-urgent messages can be sent to your provider as well.   To learn more about what you can do with MyChart, go to [email or web address].    Your next appointment:   6-9 month(s)  The format for your next appointment:   In Person  Provider:   Henry W Smith III, MD     Other Instructions   

## 2022-03-19 DIAGNOSIS — M47816 Spondylosis without myelopathy or radiculopathy, lumbar region: Secondary | ICD-10-CM | POA: Diagnosis not present

## 2022-04-06 ENCOUNTER — Other Ambulatory Visit: Payer: Self-pay | Admitting: *Deleted

## 2022-04-06 MED ORDER — AMIODARONE HCL 200 MG PO TABS: 200.0000 mg | ORAL_TABLET | Freq: Every day | ORAL | 3 refills | Status: DC

## 2022-04-16 DIAGNOSIS — Z6841 Body Mass Index (BMI) 40.0 and over, adult: Secondary | ICD-10-CM | POA: Diagnosis not present

## 2022-04-16 DIAGNOSIS — M48062 Spinal stenosis, lumbar region with neurogenic claudication: Secondary | ICD-10-CM | POA: Diagnosis not present

## 2022-04-16 DIAGNOSIS — M5416 Radiculopathy, lumbar region: Secondary | ICD-10-CM | POA: Diagnosis not present

## 2022-05-13 DIAGNOSIS — Z6841 Body Mass Index (BMI) 40.0 and over, adult: Secondary | ICD-10-CM | POA: Diagnosis not present

## 2022-05-13 DIAGNOSIS — M5416 Radiculopathy, lumbar region: Secondary | ICD-10-CM | POA: Diagnosis not present

## 2022-06-17 ENCOUNTER — Other Ambulatory Visit: Payer: Self-pay

## 2022-06-17 DIAGNOSIS — I480 Paroxysmal atrial fibrillation: Secondary | ICD-10-CM

## 2022-06-17 MED ORDER — RIVAROXABAN 20 MG PO TABS: ORAL_TABLET | ORAL | 1 refills | Status: DC

## 2022-06-17 NOTE — Telephone Encounter (Signed)
Prescription refill request for Xarelto received.  Indication: Afib  Last office visit: 03/16/22 Tamala Julian)  Weight: 154.5kg Age: 81 Scr: 1.23 (09/22/21)  CrCl: 104.25m/min  Appropriate dose and refill sent to requested pharmacy.

## 2022-07-23 DIAGNOSIS — I10 Essential (primary) hypertension: Secondary | ICD-10-CM | POA: Diagnosis not present

## 2022-07-23 DIAGNOSIS — D6869 Other thrombophilia: Secondary | ICD-10-CM | POA: Diagnosis not present

## 2022-07-23 DIAGNOSIS — Z1331 Encounter for screening for depression: Secondary | ICD-10-CM | POA: Diagnosis not present

## 2022-07-23 DIAGNOSIS — F325 Major depressive disorder, single episode, in full remission: Secondary | ICD-10-CM | POA: Diagnosis not present

## 2022-07-23 DIAGNOSIS — I480 Paroxysmal atrial fibrillation: Secondary | ICD-10-CM | POA: Diagnosis not present

## 2022-07-23 DIAGNOSIS — N183 Chronic kidney disease, stage 3 unspecified: Secondary | ICD-10-CM | POA: Diagnosis not present

## 2022-07-23 DIAGNOSIS — K219 Gastro-esophageal reflux disease without esophagitis: Secondary | ICD-10-CM | POA: Diagnosis not present

## 2022-07-23 DIAGNOSIS — Z0000 Encounter for general adult medical examination without abnormal findings: Secondary | ICD-10-CM | POA: Diagnosis not present

## 2022-07-23 DIAGNOSIS — E785 Hyperlipidemia, unspecified: Secondary | ICD-10-CM | POA: Diagnosis not present

## 2022-07-31 DIAGNOSIS — L821 Other seborrheic keratosis: Secondary | ICD-10-CM | POA: Diagnosis not present

## 2022-07-31 DIAGNOSIS — L814 Other melanin hyperpigmentation: Secondary | ICD-10-CM | POA: Diagnosis not present

## 2022-07-31 DIAGNOSIS — D1801 Hemangioma of skin and subcutaneous tissue: Secondary | ICD-10-CM | POA: Diagnosis not present

## 2022-07-31 DIAGNOSIS — L570 Actinic keratosis: Secondary | ICD-10-CM | POA: Diagnosis not present

## 2022-07-31 DIAGNOSIS — Z85828 Personal history of other malignant neoplasm of skin: Secondary | ICD-10-CM | POA: Diagnosis not present

## 2022-09-29 DIAGNOSIS — M47816 Spondylosis without myelopathy or radiculopathy, lumbar region: Secondary | ICD-10-CM | POA: Diagnosis not present

## 2022-09-29 DIAGNOSIS — M5416 Radiculopathy, lumbar region: Secondary | ICD-10-CM | POA: Diagnosis not present

## 2022-10-10 NOTE — Progress Notes (Unsigned)
Cardiology Office Note:    Date:  10/12/2022   ID:  Joseph [name], DOB 16-Feb-1941, MRN [id number]  PCP:  Carol Ada, MD  Cardiologist:  [name], MD   Referring MD: Carol Ada, MD   Chief Complaint  Patient presents with   Shortness of Breath   Congestive Heart Failure   Hypertension   Hyperlipidemia   Follow-up    Amiodarone therapy which patient has autoregulated up to 400 mg/day for the past month because he felt he was in atrial fib    History of Present Illness:    Joseph [name] is a 81 y.o. male with a hx of paroxysmal atrial fibrillation, history of atrial fibrillation ablation, amiodarone therapy for maintenance of sinus rhythm, chronic anticoagulation therapy, ascending aortic aneurysm measuring 4.8 cm and followed by Dr. Cyndia Bent, obesity, and hypertension.   Joseph [name] states that he suddenly became short of breath on 09/07/2022.  He thought he had COVID.  3 COVID tests were unremarkable.  A week later he discovered/felt that he was in atrial fibrillation and that perhaps this was the cause of the shortness of breath.  Shortness of breath has persisted but is getting somewhat better.  At that time, he doubled his amiodarone to 200 mg twice daily which she continues to take today.  He denies orthopnea.  His EKG does demonstrate that he is in atrial fibrillation with slow ventricular response.  He has not had any chest pain.  He is worried about whether he has blockages.  He was being comanaged with Dr. Cyndia Bent who is stopped following his aortic size a couple years ago because his aortic diameter remains stable at 4.7 since 2009.  The patient is frightened by having an aneurysm but not having surveillance.  Past Medical History:  Diagnosis Date   Allergy    Atrial fibrillation ([place]) 11/2008   Atrial fibrillation ([place])    Atypical atrial flutter ([place]) 07/13/2014   Diagnosis on EKG 07/13/14 : Cardioversion August 2015   Colon polyp    Depression     Diverticulosis    Dysrhythmia    GERD (gastroesophageal reflux disease)    Headache(784.0)    Hyperlipidemia    Hypertension    Panic attacks    PONV (postoperative nausea and vomiting)    Thoracic aneurysm     Past Surgical History:  Procedure Laterality Date   ablataion  07/06/2012   APPENDECTOMY  1958   CARDIAC CATHETERIZATION  2009   CARDIOVERSION  03/18/2012   Procedure: CARDIOVERSION;  Surgeon: [name], MD;  Location: [facility];  Service: Cardiovascular;  Laterality: N/A;   CARDIOVERSION N/A 08/23/2014   Procedure: CARDIOVERSION;  Surgeon: [name], MD;  Location: [facility];  Service: Cardiovascular;  Laterality: N/A;   INGUINAL HERNIA REPAIR     right   LUMBAR LAMINECTOMY/DECOMPRESSION MICRODISCECTOMY Bilateral 02/08/2015   Procedure: Laminectomy and Foraminotomy - bilateral - Lumbar two-three, three-four, left lumbar four-five with resection of synovial cyst ;  Surgeon: Charlie Pitter, MD;  Location: [facility];  Service: Neurosurgery;  Laterality: Bilateral;   spinal tap     VASECTOMY      Current Medications: Current Meds  Medication Sig   acetaminophen (TYLENOL) 500 MG tablet Take 500 mg by mouth every 6 (six) hours as needed (pain).   ALPRAZolam (XANAX) 0.5 MG tablet Take 0.5-1 mg by mouth 3 (three) times daily as needed for anxiety or sleep.    amiodarone (PACERONE)  200 MG tablet Take 1 tablet (200 mg total) by mouth daily.   CONTRAVE 8-90 MG TB12 Take 2 tablets by mouth 2 (two) times daily.   doxycycline (VIBRA-TABS) 100 MG tablet Take 100 mg by mouth 2 (two) times daily.   hydrochlorothiazide (HYDRODIURIL) 25 MG tablet Take 25 mg by mouth daily.   metoprolol succinate (TOPROL-XL) 100 MG 24 hr tablet Take 100 mg by mouth daily. Take with or immediately following a meal.   olmesartan (BENICAR) 40 MG tablet Take 40 mg by mouth daily.   pantoprazole (PROTONIX) 40 MG tablet Take 40 mg by mouth daily.   potassium chloride SA (K-DUR,KLOR-CON) 20 MEQ  tablet Take 20 mEq by mouth daily.   rivaroxaban (XARELTO) 20 MG TABS tablet TAKE 1 TABLET(20 MG) BY MOUTH DAILY WITH SUPPER   rosuvastatin (CRESTOR) 20 MG tablet TAKE 1 TABLET(20 MG) BY MOUTH DAILY   sertraline (ZOLOFT) 100 MG tablet Take 100 mg by mouth daily.     Allergies:   Patient has no known allergies.   Social History   Socioeconomic History   Marital status: Married    Spouse name: Kashaun Bebo   Number of children: 2   Years of education: College   Highest education level: Not on file  Occupational History   Occupation: Retired  Tobacco Use   Smoking status: Never   Smokeless tobacco: Never  Vaping Use   Vaping Use: Never used  Substance and Sexual Activity   Alcohol use: No   Drug use: No   Sexual activity: Not on file  Other Topics Concern   Not on file  Social History Narrative   Patient lives at home with spouse.   Caffeine Use: 1 cup daily   Social Determinants of Health   Financial Resource Strain: Not on file  Food Insecurity: Not on file  Transportation Needs: Not on file  Physical Activity: Not on file  Stress: Not on file  Social Connections: Not on file     Family History: The patient's family history includes Heart attack in his brother and mother; Heart disease in his father and maternal grandfather; Heart disease (age of onset: 42) in his mother; Kidney disease (age of onset: 75) in his brother; Lung cancer in his father. There is no history of Colon cancer.  ROS:   Please see the history of present illness.    We will lose weight.  Sleeping okay.  No lower extremity swelling.  No chest discomfort.  All other systems reviewed and are negative.  EKGs/Labs/Other Studies Reviewed:    The following studies were reviewed today:  CTA Chest 2022: IMPRESSION: Unchanged size and configuration of the ascending aorta, estimated 4.7 cm on the current CT. Ascending thoracic aortic aneurysm. Recommend semi-annual imaging followup by CTA or MRA  and referral to cardiothoracic surgery if not already obtained. This recommendation follows 2010 ACCF/AHA/AATS/ACR/ASA/SCA/SCAI/SIR/STS/SVM Guidelines for the Diagnosis and Management of Patients With Thoracic Aortic Disease. Circulation. 2010; 121: L244-W102   Coronary artery disease and aortic atherosclerosis. Aortic Atherosclerosis (ICD10-I70.0).  EKG:  EKG atrial fibs, heart rate 61 bpm, relatively low voltage with left axis deviation.  Last definite confirmation of sinus rhythm was January 2022.  The March 2023 tracing had too much artifact to be certain about rhythm however the RR intervals were regular.  Recent Labs: No results found for requested labs within last 365 days.  Recent Lipid Panel    Component Value Date/Time   CHOL 151 11/01/2019 0758   TRIG  171 (H) 11/01/2019 0758   HDL 55 11/01/2019 0758   CHOLHDL 2.7 11/01/2019 0758   CHOLHDL 3 05/13/2011 0900   VLDL 17.4 05/13/2011 0900   LDLCALC 67 11/01/2019 0758    Physical Exam:    VS:  BP 116/62   Pulse (!) 54   Ht '5\' 9"'$  (1.753 m)   Wt (!) 359 lb 3.2 oz (162.9 kg)   SpO2 97%   BMI 53.04 kg/m     Wt Readings from Last 3 Encounters:  10/12/22 (!) 359 lb 3.2 oz (162.9 kg)  03/16/22 (!) 340 lb 9.6 oz (154.5 kg)  09/22/21 (!) 349 lb 6.4 oz (158.5 kg)     GEN: Morbid with BMI of 53. No acute distress HEENT: Normal NECK: No JVD. LYMPHATICS: No lymphadenopathy CARDIAC: No murmur. RRR no gallop, or edema. VASCULAR:  Normal Pulses. No bruits. RESPIRATORY:  Clear to auscultation without rales, wheezing or rhonchi  ABDOMEN: Soft, non-tender, non-distended, No pulsatile mass, MUSCULOSKELETAL: No deformity  SKIN: Warm and dry NEUROLOGIC:  Alert and oriented x 3 PSYCHIATRIC:  Normal affect   ASSESSMENT:    1. paroxysmal atrial fibrillation   2. Essential hypertension   3. Acquired thrombophilia ([place])   4. Other hyperlipidemia   5. Chronic anticoagulation   6. On amiodarone therapy   7. Aneurysm of ascending  aorta without rupture (HCC)    PLAN:    In order of problems listed above:  Possibly developed atrial fibrillation on 09/07/2022 but we do not have documentation.  He does not have a device.  A week later he amiodarone dosing to 200 mg twice daily.  Today's EKG confirms that he is in atrial fibrillation.  Last prior tracing in March with terrible baseline artifact but irregular RR interval and presumed sinus rhythm at that time.  Assumption, is that A-fib developed on 09/07/2022.  Unfortunately he has been off Xarelto for 3 days for a back injection tomorrow.  He therefore will need to get back on Xarelto and have uninterrupted therapy for at least 4 weeks prior to an attempt at electrical cardioversion.  Continue amiodarone 200 mg twice daily to see if we can facilitate pharmacologic conversion.  He should decrease Amio back to 200 mg/day after this additional 2-week load at a higher dose.  He should have a 2 to 4-week follow-up after cardioversion in the A-fib clinic.  He does have significant dyspnea associated.  He will wear a 2-week monitor.  He has prior history of ablation performed years ago by EP in [facility]. Blood pressure control is adequate. He he will continue Xarelto. Continue rosuvastatin 20 mg/day. He is transiently on a higher dose of amiodarone since mid September, 200 mg twice daily.  A TSH, hemoglobin, Bmet, and BNP will be obtained today.   Medication Adjustments/Labs and Tests Ordered: Current medicines are reviewed at length with the patient today.  Concerns regarding medicines are outlined above.  Orders Placed This Encounter  Procedures   CT ANGIO CHEST AORTA W/CM & OR WO/CM   Basic metabolic panel   Pro b natriuretic peptide (BNP)   TSH   Hemoglobin   LONG TERM MONITOR (3-14 DAYS)   EKG 12-Lead   No orders of the defined types were placed in this encounter.   Patient Instructions  Medication Instructions:  Your physician has recommended you make the following  change in your medication:   1) RESUME amiodarone '200mg'$  once daily in 2 weeks 2) RESUME Xarelto '20mg'$  daily, you will need  to be on Xarelto for a full month before having a cardioversion performed. We will plan to have this done around November 15th and will call you with more instructions once scheduled.  *If you need a refill on your cardiac medications before your next appointment, please call your pharmacy*  Lab Work: TODAY: BMET, BNP, Hemoglobin, TSH If you have labs (blood work) drawn today and your tests are completely normal, you will receive your results only by: [place] (if you have MyChart) OR A paper copy in the mail If you have any lab test that is abnormal or we need to change your treatment, we will call you to review the results.  Testing/Procedures: Your physician has requested that you wear a heart monitor for 2 weeks (14 days). This will be mailed to your home with instructions on how to apply the monitor and how to return when finished. Please allow 2 weeks after returning the monitor before our office calls you with the results.  Your physician has requested you have a CT angio of chest/aorta performed.  Follow-Up: At [facility], you and your health needs are our priority.  As part of our continuing mission to provide you with exceptional heart care, we have created designated Provider Care Teams.  These Care Teams include your primary Cardiologist (physician) and Advanced Practice Providers (APPs -  Physician Assistants and Nurse Practitioners) who all work together to provide you with the care you need, when you need it.  Your next appointment:   November 8th for a nurse visit for EKG prior to cardioversion 2-3 weeks after cardioversion with A-Fib clinic 6 month(s)  The format for your next appointment:   In Person  Provider:   [name], MD  or Nicholes Rough, PA-C, Ambrose Pancoast, NP, Christen Bame, NP, or Richardson Dopp, PA-C         Important Information About Sugar         Signed, [name], MD  10/12/2022 1:56 PM    [place]

## 2022-10-12 ENCOUNTER — Encounter: Payer: Self-pay | Admitting: Interventional Cardiology

## 2022-10-12 ENCOUNTER — Ambulatory Visit: Payer: Medicare PPO | Attending: Interventional Cardiology | Admitting: Interventional Cardiology

## 2022-10-12 ENCOUNTER — Ambulatory Visit: Payer: Medicare PPO | Attending: Interventional Cardiology

## 2022-10-12 VITALS — BP 116/62 | HR 54 | Ht 69.0 in | Wt 359.2 lb

## 2022-10-12 DIAGNOSIS — D6869 Other thrombophilia: Secondary | ICD-10-CM | POA: Diagnosis not present

## 2022-10-12 DIAGNOSIS — Z7901 Long term (current) use of anticoagulants: Secondary | ICD-10-CM | POA: Diagnosis not present

## 2022-10-12 DIAGNOSIS — I7121 Aneurysm of the ascending aorta, without rupture: Secondary | ICD-10-CM

## 2022-10-12 DIAGNOSIS — Z79899 Other long term (current) drug therapy: Secondary | ICD-10-CM | POA: Diagnosis not present

## 2022-10-12 DIAGNOSIS — I10 Essential (primary) hypertension: Secondary | ICD-10-CM

## 2022-10-12 DIAGNOSIS — E7849 Other hyperlipidemia: Secondary | ICD-10-CM

## 2022-10-12 DIAGNOSIS — I4819 Other persistent atrial fibrillation: Secondary | ICD-10-CM | POA: Diagnosis not present

## 2022-10-12 NOTE — Progress Notes (Unsigned)
Enrolled for [employer] to mail a ZIO XT long term holter monitor to the patients address on file.  

## 2022-10-12 NOTE — Patient Instructions (Addendum)
Medication Instructions:  Your physician has recommended you make the following change in your medication:   1) RESUME amiodarone '200mg'$  once daily in 2 weeks 2) RESUME Xarelto '20mg'$  daily, you will need to be on Xarelto for a full month before having a cardioversion performed. We will plan to have this done around November 15th and will call you with more instructions once scheduled.  *If you need a refill on your cardiac medications before your next appointment, please call your pharmacy*  Lab Work: TODAY: BMET, BNP, Hemoglobin, TSH If you have labs (blood work) drawn today and your tests are completely normal, you will receive your results only by: [place] (if you have MyChart) OR A paper copy in the mail If you have any lab test that is abnormal or we need to change your treatment, we will call you to review the results.  Testing/Procedures: Your physician has requested that you wear a heart monitor for 2 weeks (14 days). This will be mailed to your home with instructions on how to apply the monitor and how to return when finished. Please allow 2 weeks after returning the monitor before our office calls you with the results.  Your physician has requested you have a CT angio of chest/aorta performed.  Follow-Up: At [facility], you and your health needs are our priority.  As part of our continuing mission to provide you with exceptional heart care, we have created designated Provider Care Teams.  These Care Teams include your primary Cardiologist (physician) and Advanced Practice Providers (APPs -  Physician Assistants and Nurse Practitioners) who all work together to provide you with the care you need, when you need it.  Your next appointment:   November 8th for a nurse visit for EKG prior to cardioversion 2-3 weeks after cardioversion with A-Fib clinic 6 month(s)  The format for your next appointment:   In Person  Provider:   [name], MD  or Nicholes Rough,  PA-C, Ambrose Pancoast, NP, Christen Bame, NP, or Richardson Dopp, PA-C        Important Information About Sugar

## 2022-10-13 DIAGNOSIS — M5416 Radiculopathy, lumbar region: Secondary | ICD-10-CM | POA: Diagnosis not present

## 2022-10-13 LAB — BASIC METABOLIC PANEL
BUN/Creatinine Ratio: 15 (ref 10–24)
BUN: 20 mg/dL (ref 8–27)
CO2: 22 mmol/L (ref 20–29)
Calcium: 9.2 mg/dL (ref 8.6–10.2)
Chloride: 97 mmol/L (ref 96–106)
Creatinine, Ser: 1.37 mg/dL — ABNORMAL HIGH (ref 0.76–1.27)
Glucose: 101 mg/dL — ABNORMAL HIGH (ref 70–99)
Potassium: 5.3 mmol/L — ABNORMAL HIGH (ref 3.5–5.2)
Sodium: 134 mmol/L (ref 134–144)
eGFR: 52 mL/min/{1.73_m2} — ABNORMAL LOW (ref >59)

## 2022-10-13 LAB — PRO B NATRIURETIC PEPTIDE: NT-Pro BNP: 913 pg/mL — ABNORMAL HIGH (ref 0–486)

## 2022-10-13 LAB — HEMOGLOBIN: Hemoglobin: 10.1 g/dL — ABNORMAL LOW (ref 13.0–17.7)

## 2022-10-13 LAB — TSH: TSH: 2.89 u[IU]/mL (ref 0.450–4.500)

## 2022-10-21 ENCOUNTER — Telehealth: Payer: Self-pay | Admitting: Interventional Cardiology

## 2022-10-21 ENCOUNTER — Ambulatory Visit (HOSPITAL_COMMUNITY)
Admission: RE | Admit: 2022-10-21 | Discharge: 2022-10-21 | Disposition: A | Discharge status: Home / Self Care | Payer: Medicare PPO | Source: Ambulatory Visit | Attending: Interventional Cardiology | Admitting: Interventional Cardiology

## 2022-10-21 DIAGNOSIS — I7121 Aneurysm of the ascending aorta, without rupture: Secondary | ICD-10-CM | POA: Insufficient documentation

## 2022-10-21 DIAGNOSIS — I719 Aortic aneurysm of unspecified site, without rupture: Secondary | ICD-10-CM | POA: Diagnosis not present

## 2022-10-21 MED ORDER — IOHEXOL 350 MG/ML SOLN
80.0000 mL | Freq: Once | INTRAVENOUS | Status: AC | PRN
  Administered 2022-10-21: 80 mL via INTRAVENOUS

## 2022-10-21 NOTE — Telephone Encounter (Signed)
See other telephone note from today 

## 2022-10-21 NOTE — Telephone Encounter (Signed)
Left detailed message that we will move his nurse visit to a more convenient day/time that works with his schedule.

## 2022-10-21 NOTE — Telephone Encounter (Signed)
Pt is requesting call back to change nurse visit scheduled 11/08 at 11 a.m. Requesting call back.

## 2022-10-21 NOTE — Telephone Encounter (Signed)
Left message to call back  

## 2022-10-21 NOTE — Telephone Encounter (Signed)
Patient's wife returned call.  

## 2022-10-21 NOTE — Telephone Encounter (Signed)
Henson, April at 10/21/2022  4:27 PM  Status: Signed  Pt is requesting call back to change nurse visit scheduled 11/08 at 11 a.m. Requesting call back.

## 2022-10-21 NOTE — Telephone Encounter (Signed)
Rescheduled to 11/14, day before DCCV. Wife agreeable to plan.

## 2022-10-21 NOTE — Telephone Encounter (Signed)
Patient's wife is requesting to reschedule 11/08 nurse visit due to having an overlapping appointment scheduled for 11:30 AM.

## 2022-10-22 ENCOUNTER — Telehealth: Payer: Self-pay | Admitting: Interventional Cardiology

## 2022-10-22 DIAGNOSIS — I4819 Other persistent atrial fibrillation: Secondary | ICD-10-CM | POA: Diagnosis not present

## 2022-10-22 NOTE — Telephone Encounter (Signed)
Patient returned RN's call. 

## 2022-10-22 NOTE — Telephone Encounter (Signed)
Returned call to patient to discuss CT results.  Per Dr. Tamala Julian: Let the patient know there is stable aortic diameter c/w prior. There are upper lobe infiltrates. Any pna symptoms? Forward report to PCP.   Patient states he had symptoms of COVID on 09/07/22 with coughing, sore throat and SOB. Denies fever. He tested negative for COVID and most symptoms cleared in 3 days. He continues to have SOB that is improving with time.  Results sent to PCP Dr. Carol Ada.  Patient asked about having a follow-up chest CT in 3 months to recheck lungs.  Advised patient to call our office if SOB continues or gets worse. Patient verbalized understanding.  Will forward to Dr. Tamala Julian to review and advise on repeat chest CT.

## 2022-10-28 ENCOUNTER — Telehealth: Payer: Self-pay

## 2022-10-28 ENCOUNTER — Ambulatory Visit: Payer: Medicare PPO | Admitting: Gastroenterology

## 2022-10-28 ENCOUNTER — Encounter: Payer: Self-pay | Admitting: Gastroenterology

## 2022-10-28 VITALS — BP 142/96 | HR 67 | Ht 69.0 in | Wt 349.0 lb

## 2022-10-28 DIAGNOSIS — Z7901 Long term (current) use of anticoagulants: Secondary | ICD-10-CM

## 2022-10-28 DIAGNOSIS — K2100 Gastro-esophageal reflux disease with esophagitis, without bleeding: Secondary | ICD-10-CM

## 2022-10-28 DIAGNOSIS — D509 Iron deficiency anemia, unspecified: Secondary | ICD-10-CM | POA: Diagnosis not present

## 2022-10-28 DIAGNOSIS — R195 Other fecal abnormalities: Secondary | ICD-10-CM | POA: Diagnosis not present

## 2022-10-28 MED ORDER — NA SULFATE-K SULFATE-MG SULF 17.5-3.13-1.6 GM/177ML PO SOLN: 1.0000 | Freq: Once | ORAL | 0 refills | Status: AC

## 2022-10-28 NOTE — Patient Instructions (Signed)
You have been scheduled for an endoscopy and colonoscopy. Please follow the written instructions given to you at your visit today. Please pick up your prep supplies at the pharmacy within the next 1-3 days. If you use inhalers (even only as needed), please bring them with you on the day of your procedure.  The [facility] providers would like to encourage you to use MYCHART to communicate with providers for non-urgent requests or questions.  Due to long hold times on the telephone, sending your provider a message by MYCHART may be a faster and more efficient way to get a response.  Please allow 48 business hours for a response.  Please remember that this is for non-urgent requests.   Due to recent changes in healthcare laws, you may see the results of your imaging and laboratory studies on MyChart before your provider has had a chance to review them.  We understand that in some cases there may be results that are confusing or concerning to you. Not all laboratory results come back in the same time frame and the provider may be waiting for multiple results in order to interpret others.  Please give us 48 hours in order for your provider to thoroughly review all the results before contacting the office for clarification of your results.   Thank you for choosing me and [facility].  Malcolm T. Stark, Jr., MD., FACG  

## 2022-10-28 NOTE — Progress Notes (Signed)
Assessment     Occult blood in stool, IDA - R/O colorectal neoplasms, AVMs, ulcer, etc.  GERD with LA Class B esophagitis and history of an esophageal stricture Paroxysmal afib on Xarelto. Potential cardioversion on Nov 15. BMI=51.54   Recommendations    Schedule colonoscopy at EGD at the hospital when cleared by cardiology.  He is at an elevated risk for procedures and sedation.  The risks (including bleeding, perforation, infection, missed lesions, medication reactions and possible hospitalization or surgery if complications occur), benefits, and alternatives to colonoscopy with possible biopsy and possible polypectomy were discussed with the patient and they consent to proceed.  The risks (including bleeding, perforation, infection, missed lesions, medication reactions and possible hospitalization or surgery if complications occur), benefits, and alternatives to endoscopy with possible biopsy and possible dilation were discussed with the patient and they consent to proceed.   Hold Xarelto 2 days before procedure - will instruct when and how to resume after procedure. Low but real risk of cardiovascular event such as heart attack, stroke, embolism, thrombosis or ischemia/infarct of other organs off Xarelto explained and need to seek urgent help if this occurs. The patient consents to proceed. Will communicate by phone or EMR with patient's prescribing provider to confirm that holding Xarelto is reasonable in this case.   Continue pantoprazole 40 mg po qd and follow antireflux measures    HPI    This is an 81 year old male with IDA and heme + stool.  He is a former patient of Dr. Kelby Fam.  Colonoscopy and EGD performed by Dr. Deatra Ina below.  Recently his Hgb has decreased from 15 to 10.  Referral records indicate iron deficiency anemia was diagnosed however I do not have the associated blood work available at this time.  Stool Hemoccult cards were positive.  Patient relates very good control  of his reflux symptoms on pantoprazole.  He has not noted melena, hematochezia or change in bowel habits.  He is wearing a cardiac monitor with tentative plans for cardioversion on November 15 with Dr. Pernell Dupre.  Denies weight loss, abdominal pain, constipation, diarrhea, change in stool caliber, nausea, vomiting, dysphagia, chest pain.   EGD Oct 2015 1. There was a short stricture at the gastroesophageal junction; The stricture was dilated using a 18 mm (52Fr) Maloney dilator 2. There was LA Class B esophagitis noted 3. EGD was otherwise normal  Colonoscopy Nov 2012  Moderate sigmoid colon diverticulosis  2.   o/w negative exam  Labs / Imaging       Latest Ref Rng & Units 11/01/2019    7:58 AM 12/10/2015    3:45 PM 09/30/2013    3:33 AM  Hepatic Function  Total Protein 6.0 - 8.5 g/dL 6.9  6.7  7.0   Albumin 3.7 - 4.7 g/dL 4.7  4.4  4.1   AST 0 - 40 IU/L 21  18  32   ALT 0 - 44 IU/L _0 Alk Phosphatase 39 - 117 IU/L 66  67  60   Total Bilirubin 0.0 - 1.2 mg/dL 0.4  0.4  0.5   Bilirubin, Direct 0.00 - 0.40 mg/dL 0.12  0.1         Latest Ref Rng & Units 10/12/2022    2:07 PM 09/22/2021    8:55 AM 01/31/2015    2:00 PM  CBC  WBC 3.4 - 10.8 x10E3/uL  6.6  5.6   Hemoglobin 13.0 - 17.7 g/dL 10.1  12.4  15.5   Hematocrit 37.5 - 51.0 %  38.9  44.2   Platelets 150 - 450 x10E3/uL  270  225      CT ANGIO CHEST AORTA W/CM & OR WO/CM  Result Date: 10/21/2022 CLINICAL DATA:  Evaluate thoracic aortic aneurysm. EXAM: CT ANGIOGRAPHY CHEST WITH CONTRAST TECHNIQUE: Multidetector CT imaging of the chest was performed using the standard protocol during bolus administration of intravenous contrast. Multiplanar CT image reconstructions and MIPs were obtained to evaluate the vascular anatomy. RADIATION DOSE REDUCTION: This exam was performed according to the departmental dose-optimization program which includes automated exposure control, adjustment of the mA and/or kV according to patient  size and/or use of iterative reconstruction technique. CONTRAST:  28m OMNIPAQUE IOHEXOL 350 MG/ML SOLN COMPARISON:  10/03/2021; 11/08/2019; 03/23/2018; 03/24/2017; 03/25/2016; 12/17/2008 FINDINGS: Vascular Findings: Fusiform aneurysmal dilatation of the ascending thoracic aorta was measurements as follows. The thoracic aorta tapers to a normal caliber at the level of the aortic arch. There is a very minimal amount of atherosclerotic plaque within the aortic arch, not resulting in a hemodynamically significant stenosis. No evidence of thoracic aortic dissection or perivascular stranding this nongated examination. Bovine configuration of the aortic arch. The branch vessels of the aortic arch are tortuous though appear patent throughout their imaged courses. Note is again made of a left dominant vertebral artery. Cardiomegaly. Coronary artery calcifications. Calcifications involving the mitral valve annulus. No pericardial effusion. Although this examination was not tailored for the evaluation the pulmonary arteries, there are no discrete filling defects within the central pulmonary arterial tree to suggest central pulmonary embolism. Enlarged caliber of the main pulmonary artery measuring 38 mm in diameter, unchanged. ------------------------------------------------------------- Thoracic aortic measurements: SINOTUBULAR JUNCTION: 35 mm as measured in greatest oblique short axis coronal dimension. PROXIMAL ASCENDING THORACIC AORTA: 49 mm as measured in greatest oblique short axis axial dimension (image 76, series 5) at the level of the main pulmonary artery and approximately 49 mm as measured in greatest oblique short axis coronal dimension (image 60, series 8), grossly unchanged compared to remote examination performed in 2009. AORTIC ARCH: 35 mm as measured in greatest oblique short axis sagittal dimension. PROXIMAL DESCENDING THORACIC AORTA: 31 mm as measured in greatest oblique short axis axial dimension at the  level of the main pulmonary artery. DISTAL DESCENDING THORACIC AORTA: 29 mm as measured in greatest oblique short axis axial dimension at the level of the diaphragmatic hiatus. Review of the MIP images confirms the above findings. ------------------------------------------------------------- Non-Vascular Findings: Mediastinum/Lymph Nodes: No bulky mediastinal, hilar or axillary lymphadenopathy. Lungs/Pleura: Interval development of ill-defined slightly nodular bilateral upper lobe and right middle lobe ground-glass opacities with dominant opacity within the right middle lobe measuring approximately 2.2 x 1.7 cm (image 92, series 6). No associated air bronchograms. The central pulmonary airways appear widely patent. No pleural effusion or pneumothorax. There is a punctate granuloma seen within the lingula (image 98, series 6). Upper abdomen: Limited early arterial phase evaluation of the upper abdomen demonstrates a small hiatal hernia. Musculoskeletal: No acute or aggressive osseous abnormalities. Stigmata of dish throughout the thoracic spine. Regional soft tissues appear normal. There is an approximately 0.8 cm hypoechoic nodule within the anterior aspect the left lobe of the thyroid image 29, series 5, morphologically similar to the 02/2016 examination and thus likely a benign etiology. IMPRESSION: 1. Stable uncomplicated fusiform aneurysmal dilatation of the ascending thoracic aorta measuring 49 mm in diameter, grossly unchanged compared to remote examination performed in 2009. Aortic aneurysm NOS (ICD10-I71.9). 2.  Development of bilateral upper lobe predominant ill-defined slightly nodular ground-glass airspace opacities, nonspecific though given multiplicity and upper lobe predominance is favored to represent multifocal atypical infection. Clinical correlation is advised. Follow-up chest CT in 3-6 months could be obtained to ensure resolution as indicated. 3. Small hiatal hernia. 4. Coronary artery disease.   Aortic Atherosclerosis (ICD10-I70.0). Electronically Signed   By: Sandi Mariscal M.D.   On: 10/21/2022 09:24    Current Medications, Allergies, Past Medical History, Past Surgical History, Family History and Social History were reviewed in [employer] record.   Physical Exam: General: Well developed, well nourished, no acute distress, uses a walker Head: Normocephalic and atraumatic Eyes: Sclerae anicteric, EOMI Ears: Normal auditory acuity Mouth: Not examined Lungs: Clear throughout to auscultation Heart: Regular rate and rhythm; no murmurs, rubs or bruits Abdomen: Soft, non tender and non distended. No masses, hepatosplenomegaly or hernias noted. Normal Bowel sounds Rectal: Deferred to colonoscopy Musculoskeletal: Symmetrical with no gross deformities  Pulses:  Normal pulses noted Extremities: No clubbing, cyanosis, edema or deformities noted Neurological: Alert oriented x 4, grossly nonfocal Psychological:  Alert and cooperative. Normal mood and affect   Ita Fritzsche T. Fuller Plan, MD 10/28/2022, 2:10 PM

## 2022-10-28 NOTE — Telephone Encounter (Signed)
[facility] Pre-operative Risk Assessment     Request for surgical clearance:     Endoscopy Procedure at [facility]  What type of surgery is being performed?     EGD/Colon  When is this surgery scheduled?     12/03/22  What type of clearance is required ?   Pharmacy and Medical clearance  Are there any medications that need to be held prior to surgery and how long? Xarelto x 2 days  Practice name and name of physician performing surgery?      [facility]  What is your office phone and fax number?      Phone- [phone]  [phone]  Anesthesia type (None, local, MAC, general) ?       MAC at [facility]

## 2022-10-29 ENCOUNTER — Telehealth: Payer: Self-pay | Admitting: Gastroenterology

## 2022-10-29 NOTE — Telephone Encounter (Signed)
I spoke with Joseph [name] and told her to try [facility] and they should be able to get the suprep for $40 to $45. She will try that . I instructed her on how to get the coupon code on her phone.

## 2022-10-29 NOTE — Telephone Encounter (Signed)
Inbound call from patient wife stating their insurance does not cover the generic prep medication. She is requesting another prep if possible. Please advise.

## 2022-11-02 NOTE — Telephone Encounter (Addendum)
Patient with diagnosis of PAF(paroxysmal atrial fibrillation) on Xarelto for anticoagulation.    Procedure: EGD/Colonoscopy Date of procedure: 12/03/2022   CHA2DS2-VASc Score = 4   This indicates a 4.8% annual risk of stroke. The patient's score is based upon: CHF History: 0 HTN History: 1 Diabetes History: 0 Stroke History: 0 Vascular Disease History: 1 Age Score: 2 Gender Score: 0     CrCl 63 mL/min (Adj BW) (based on SrCr 1.37 - 10/12/2022) Platelet count : no recent lab available    Per office protocol, patient can hold Xarelto for 2 days prior to procedure.   Patient will not need bridging with Lovenox (enoxaparin) around procedure.  **This guidance is not considered finalized until pre-operative APP has relayed final recommendations.**

## 2022-11-04 ENCOUNTER — Ambulatory Visit: Payer: Medicare PPO

## 2022-11-04 DIAGNOSIS — M5416 Radiculopathy, lumbar region: Secondary | ICD-10-CM | POA: Diagnosis not present

## 2022-11-10 ENCOUNTER — Ambulatory Visit: Payer: Medicare PPO | Attending: Interventional Cardiology

## 2022-11-10 DIAGNOSIS — I480 Paroxysmal atrial fibrillation: Secondary | ICD-10-CM | POA: Diagnosis not present

## 2022-11-10 NOTE — Progress Notes (Unsigned)
   Nurse Visit   Date of Encounter: 11/10/2022 ID: Joseph [name], DOB [birth year], MRN [id number]  PCP:  Carol Ada, [place] [facility] Cardiologist:  [name], MD { Click to update primary MD,subspecialty MD or APP then REFRESH:1}     Visit Details   VS:  There were no vitals taken for this visit. , BMI There is no height or weight on file to calculate BMI.  Wt Readings from Last 3 Encounters:  10/28/22 (!) 349 lb (158.3 kg)  10/12/22 (!) 359 lb 3.2 oz (162.9 kg)  03/16/22 (!) 340 lb 9.6 oz (154.5 kg)     Reason for visit: EKG prior to cardioversion scheduled for 11/11/2022 to determine if pt remains in Afib.   Performed today: EKG and consulted with Dr Turner,DOD who recommended RN consult with Dr Tamala Julian for review of EKG. Changes (medications, testing, etc.) : EKG shows NSR per Dr Tamala Julian.  Cardioversion has been canceled.  Keep Afib Clinic appt. 11/23/22. Patient aware.  Length of Visit: 20 minutes  EKG completed by [facility] H,CMA  Medications Adjustments/Labs and Tests Ordered: No orders of the defined types were placed in this encounter.  No orders of the defined types were placed in this encounter.    Signed, Thora Lance, RN  11/10/2022 12:12 PM

## 2022-11-10 NOTE — Patient Instructions (Signed)
Medication Instructions:  Your physician recommends that you continue on your current medications as directed. Please refer to the Current Medication list given to you today.  *If you need a refill on your cardiac medications before your next appointment, please call your pharmacy*   Lab Work: None ordered.  If you have labs (blood work) drawn today and your tests are completely normal, you will receive your results only by: [place] (if you have MyChart) OR A paper copy in the mail If you have any lab test that is abnormal or we need to change your treatment, we will call you to review the results.   Testing/Procedures: Cardioversion has been canceled for 11/11/2022.   Follow-Up: At [facility], you and your health needs are our priority.  As part of our continuing mission to provide you with exceptional heart care, we have created designated Provider Care Teams.  These Care Teams include your primary Cardiologist (physician) and Advanced Practice Providers (APPs -  Physician Assistants and Nurse Practitioners) who all work together to provide you with the care you need, when you need it.  We recommend signing up for the patient portal called "MyChart".  Sign up information is provided on this After Visit Summary.  MyChart is used to connect with patients for Virtual Visits (Telemedicine).  Patients are able to view lab/test results, encounter notes, upcoming appointments, etc.  Non-urgent messages can be sent to your provider as well.   To learn more about what you can do with MyChart, go to [email or web address].    Your next appointment:   11/23/2022 with Adline Peals, [place]

## 2022-11-11 ENCOUNTER — Ambulatory Visit (HOSPITAL_COMMUNITY): Admission: RE | Admit: 2022-11-11 | Payer: Medicare PPO | Source: Ambulatory Visit | Admitting: Cardiovascular Disease

## 2022-11-11 ENCOUNTER — Encounter (HOSPITAL_COMMUNITY): Admission: RE | Payer: Self-pay | Source: Ambulatory Visit

## 2022-11-11 DIAGNOSIS — I4819 Other persistent atrial fibrillation: Secondary | ICD-10-CM | POA: Diagnosis not present

## 2022-11-11 SURGERY — CARDIOVERSION
Anesthesia: Monitor Anesthesia Care

## 2022-11-23 ENCOUNTER — Ambulatory Visit (HOSPITAL_COMMUNITY)
Admission: RE | Admit: 2022-11-23 | Discharge: 2022-11-23 | Disposition: A | Discharge status: Home / Self Care | Payer: Medicare PPO | Source: Ambulatory Visit | Attending: Physician Assistant | Admitting: Physician Assistant

## 2022-11-23 ENCOUNTER — Encounter (HOSPITAL_COMMUNITY): Payer: Self-pay | Admitting: Physician Assistant

## 2022-11-23 VITALS — BP 126/82 | HR 67 | Ht 69.0 in | Wt 361.0 lb

## 2022-11-23 DIAGNOSIS — E669 Obesity, unspecified: Secondary | ICD-10-CM | POA: Diagnosis not present

## 2022-11-23 DIAGNOSIS — I4819 Other persistent atrial fibrillation: Secondary | ICD-10-CM | POA: Diagnosis not present

## 2022-11-23 DIAGNOSIS — Z6841 Body Mass Index (BMI) 40.0 and over, adult: Secondary | ICD-10-CM | POA: Insufficient documentation

## 2022-11-23 DIAGNOSIS — D6869 Other thrombophilia: Secondary | ICD-10-CM | POA: Diagnosis not present

## 2022-11-23 DIAGNOSIS — I10 Essential (primary) hypertension: Secondary | ICD-10-CM | POA: Insufficient documentation

## 2022-11-23 DIAGNOSIS — I7121 Aneurysm of the ascending aorta, without rupture: Secondary | ICD-10-CM | POA: Diagnosis not present

## 2022-11-23 NOTE — Progress Notes (Signed)
Primary Care Physician: Carol Ada, MD Primary Cardiologist: Dr Tamala Julian Primary Electrophysiologist: none Referring Physician: Dr Dudley Joseph [name] is a 81 y.o. male with a history of paroxysmal atrial fibrillation s/p ablation 2013, ascending aortic aneurysm followed by Dr. Cyndia Bent, obesity, and hypertension who presents for consultation in the [facility].  The patient was initially diagnosed with atrial fibrillation remotely and underwent afib ablation in 2013. He has been maintained on amiodarone and Xarelto for a CHADS2VASC score of 4.   On follow up today, patient remains in [place] today. He chemically converted with higher dose of amiodarone and did not require DCCV. No bleeding issues on anticoagulation.   Today, he denies symptoms of palpitations, chest pain, shortness of breath, orthopnea, PND, lower extremity edema, dizziness, presyncope, syncope, snoring, daytime somnolence, bleeding, or neurologic sequela. The patient is tolerating medications without difficulties and is otherwise without complaint today.    Atrial Fibrillation Risk Factors:  he does not have symptoms or diagnosis of sleep apnea. Negative sleep study. he does not have a history of rheumatic fever. he does not have a history of alcohol use. The patient does not have a history of early familial atrial fibrillation or other arrhythmias.  he has a BMI of Body mass index is 53.31 kg/m.Marland Kitchen Filed Weights   11/23/22 0825  Weight: (!) 163.7 kg    Family History  Problem Relation Age of Onset   Heart disease Mother 98       mother   Heart attack Mother    Lung cancer Father    Heart disease Father    Kidney disease Brother 26       died age 88 heart attack   Heart attack Brother    Heart disease Maternal Grandfather    Colon cancer Neg Hx      Atrial Fibrillation Management history:  Previous antiarrhythmic drugs: amiodarone Previous cardioversions: 2013,  2015 Previous ablations: 2013 CHADS2VASC score: 4 Anticoagulation history: Xarelto   Past Medical History:  Diagnosis Date   Allergy    Atrial fibrillation ([place]) 11/2008   Atrial fibrillation ([place])    Atypical atrial flutter ([place]) 07/13/2014   Diagnosis on EKG 07/13/14 : Cardioversion August 2015   Colon polyp    Depression    Diverticulosis    Dysrhythmia    GERD (gastroesophageal reflux disease)    Headache(784.0)    Hyperlipidemia    Hypertension    Panic attacks    PONV (postoperative nausea and vomiting)    Thoracic aneurysm    Past Surgical History:  Procedure Laterality Date   ablataion  07/06/2012   [place] CATHETERIZATION  2009   CARDIOVERSION  03/18/2012   Procedure: CARDIOVERSION;  Surgeon: [name], MD;  Location: [facility];  Service: Cardiovascular;  Laterality: N/A;   CARDIOVERSION N/A 08/23/2014   Procedure: CARDIOVERSION;  Surgeon: [name], MD;  Location: [facility] ENDOSCOPY;  Service: Cardiovascular;  Laterality: N/A;   INGUINAL HERNIA REPAIR     right   LUMBAR LAMINECTOMY/DECOMPRESSION MICRODISCECTOMY Bilateral 02/08/2015   Procedure: Laminectomy and Foraminotomy - bilateral - Lumbar two-three, three-four, left lumbar four-five with resection of synovial cyst ;  Surgeon: Charlie Pitter, MD;  Location: [facility] NEURO ORS;  Service: Neurosurgery;  Laterality: Bilateral;   spinal tap     VASECTOMY      Current Outpatient Medications  Medication Sig Dispense Refill   acetaminophen (TYLENOL) 500 MG tablet Take  500 mg by mouth every 6 (six) hours as needed (pain).     ALPRAZolam (XANAX) 0.5 MG tablet Take 0.25-0.5 mg by mouth 3 (three) times daily as needed (ear ringing.).     amiodarone (PACERONE) 200 MG tablet Take 1 tablet (200 mg total) by mouth daily. 90 tablet 3   CONTRAVE 8-90 MG TB12 Take 2 tablets by mouth 2 (two) times daily.     doxycycline (VIBRA-TABS) 100 MG tablet Take 100 mg by mouth 2 (two) times daily.  2    hydrochlorothiazide (HYDRODIURIL) 25 MG tablet Take 25 mg by mouth in the morning.     metoprolol succinate (TOPROL-XL) 100 MG 24 hr tablet Take 100 mg by mouth in the morning. Take with or immediately following a meal.     olmesartan (BENICAR) 40 MG tablet Take 40 mg by mouth in the morning.     pantoprazole (PROTONIX) 40 MG tablet Take 40 mg by mouth in the morning.  0   potassium chloride SA (K-DUR,KLOR-CON) 20 MEQ tablet Take 20 mEq by mouth in the morning.     rivaroxaban (XARELTO) 20 MG TABS tablet TAKE 1 TABLET(20 MG) BY MOUTH DAILY WITH SUPPER 90 tablet 1   rosuvastatin (CRESTOR) 20 MG tablet TAKE 1 TABLET(20 MG) BY MOUTH DAILY 90 tablet 1   sertraline (ZOLOFT) 100 MG tablet Take 100 mg by mouth in the morning.  0   No current facility-administered medications for this encounter.    No Known Allergies  Social History   Socioeconomic History   Marital status: Married    Spouse name: Edwyn Inclan   Number of children: 2   Years of education: College   Highest education level: Not on file  Occupational History   Occupation: Retired  Tobacco Use   Smoking status: Never   Smokeless tobacco: Never   Tobacco comments:    Never smoke 11/23/22  Vaping Use   Vaping Use: Never used  Substance and Sexual Activity   Alcohol use: No   Drug use: No   Sexual activity: Not on file  Other Topics Concern   Not on file  Social History Narrative   Patient lives at home with spouse.   Caffeine Use: 1 cup daily   Social Determinants of Health   Financial Resource Strain: Not on file  Food Insecurity: Not on file  Transportation Needs: Not on file  Physical Activity: Not on file  Stress: Not on file  Social Connections: Not on file  Intimate Partner Violence: Not on file     ROS- All systems are reviewed and negative except as per the HPI above.  Physical Exam: Vitals:   11/23/22 0825  BP: 126/82  Pulse: 67  Weight: (!) 163.7 kg  Height: '5\' 9"'$  (1.753 m)     GEN-  The patient is a well appearing obese male, alert and oriented x 3 today.   HEENT-head normocephalic, atraumatic, sclera clear, conjunctiva pink, hearing intact, trachea midline. Lungs- Clear to ausculation bilaterally, normal work of breathing Heart- Regular rate and rhythm, no murmurs, rubs or gallops  GI- soft, NT, ND, + BS Extremities- no clubbing, cyanosis, or edema MS- no significant deformity or atrophy Skin- no rash or lesion Psych- euthymic mood, full affect Neuro- strength and sensation are intact   Wt Readings from Last 3 Encounters:  11/23/22 (!) 163.7 kg  10/28/22 (!) 158.3 kg  10/12/22 (!) 162.9 kg    EKG today demonstrates  SR with bigeminal PACs Vent. rate  67 BPM PR interval * ms QRS duration 98 ms QT/QTcB 432/456 ms  Echo 12/13/19  1. Left ventricular ejection fraction, by visual estimation, is 60 to  65%. The left ventricle has normal function. There is no left ventricular  hypertrophy.   2. Left ventricular diastolic function could not be evaluated.   3. The left ventricle has no regional wall motion abnormalities.   4. Global right ventricle has normal systolic function.The right  ventricular size is moderately enlarged. No increase in right ventricular  wall thickness.   5. Left atrial size was severely dilated.   6. Right atrial size was moderately dilated.   7. The mitral valve is normal in structure. No evidence of mitral valve  regurgitation.   8. The tricuspid valve is not well visualized. Tricuspid valve  regurgitation is not demonstrated.   9. The aortic valve was not well visualized. Aortic valve regurgitation  is not visualized. No evidence of aortic valve sclerosis or stenosis.  10. The pulmonic valve was not well visualized. Pulmonic valve  regurgitation is not visualized.  11. There is moderate dilatation of the ascending aorta measuring 49 mm.  12. TR signal is inadequate for assessing pulmonary artery systolic  pressure.  13. The  inferior vena cava is dilated in size with >50% respiratory  variability, suggesting right atrial pressure of 8 mmHg.    Epic records are reviewed at length today   CHA2DS2-VASc Score = 4  The patient's score is based upon: CHF History: 0 HTN History: 1 Diabetes History: 0 Stroke History: 0 Vascular Disease History: 1 Age Score: 2 Gender Score: 0       ASSESSMENT AND PLAN: 1. Persistent Atrial Fibrillation (ICD10:  I48.19) The patient's CHA2DS2-VASc score is 4, indicating a 4.8% annual risk of stroke.   Patient appears to be maintaining SR. Continue amiodarone 200 mg daily Continue Toprol 100 mg daily Continue Xarelto 20 mg daily  2. Secondary Hypercoagulable State (ICD10:  D68.69) The patient is at significant risk for stroke/thromboembolism based upon his CHA2DS2-VASc Score of 4.  Continue Rivaroxaban (Xarelto).   3. Obesity Body mass index is 53.31 kg/m. Lifestyle modification was discussed and encouraged including regular physical activity and weight reduction.  4. HTN Stable, no changes today.   Follow up in the AF clinic in 6 months.    [facility] [address] [place], [place] [address] [phone] 11/23/2022 8:38 AM

## 2022-11-23 NOTE — Telephone Encounter (Signed)
Pending preop evaluation request for endoscopy. Pt now back in sinus rhythm and was seen today by Adline Peals PA-C.   Audry Pili, can you please provide risk evaluation for this patient that you saw this morning?

## 2022-11-23 NOTE — H&P (View-Only) (Signed)
Primary Care Physician: Carol Ada, MD Primary Cardiologist: Dr Tamala Julian Primary Electrophysiologist: none Referring Physician: Dr Dudley Major is a 81 y.o. male with a history of paroxysmal atrial fibrillation s/p ablation 2013, ascending aortic aneurysm followed by Dr. Cyndia Bent, obesity, and hypertension who presents for consultation in the [facility].  The patient was initially diagnosed with atrial fibrillation remotely and underwent afib ablation in 2013. He has been maintained on amiodarone and Xarelto for a CHADS2VASC score of 4.   On follow up today, patient remains in Hand today. He chemically converted with higher dose of amiodarone and did not require DCCV. No bleeding issues on anticoagulation.   Today, he denies symptoms of palpitations, chest pain, shortness of breath, orthopnea, PND, lower extremity edema, dizziness, presyncope, syncope, snoring, daytime somnolence, bleeding, or neurologic sequela. The patient is tolerating medications without difficulties and is otherwise without complaint today.    Atrial Fibrillation Risk Factors:  he does not have symptoms or diagnosis of sleep apnea. Negative sleep study. he does not have a history of rheumatic fever. he does not have a history of alcohol use. The patient does not have a history of early familial atrial fibrillation or other arrhythmias.  he has a BMI of Body mass index is 53.31 kg/m.Marland Kitchen Filed Weights   11/23/22 0825  Weight: (!) 163.7 kg    Family History  Problem Relation Age of Onset   Heart disease Mother 4       mother   Heart attack Mother    Lung cancer Father    Heart disease Father    Kidney disease Brother 46       died age 26 heart attack   Heart attack Brother    Heart disease Maternal Grandfather    Colon cancer Neg Hx      Atrial Fibrillation Management history:  Previous antiarrhythmic drugs: amiodarone Previous cardioversions: 2013,  2015 Previous ablations: 2013 CHADS2VASC score: 4 Anticoagulation history: Xarelto   Past Medical History:  Diagnosis Date   Allergy    Atrial fibrillation ([place]) 11/2008   Atrial fibrillation ([place])    Atypical atrial flutter ([place]) 07/13/2014   Diagnosis on EKG 07/13/14 : Cardioversion August 2015   Colon polyp    Depression    Diverticulosis    Dysrhythmia    GERD (gastroesophageal reflux disease)    Headache(784.0)    Hyperlipidemia    Hypertension    Panic attacks    PONV (postoperative nausea and vomiting)    Thoracic aneurysm    Past Surgical History:  Procedure Laterality Date   ablataion  07/06/2012   [facility] CATHETERIZATION  2009   CARDIOVERSION  03/18/2012   Procedure: CARDIOVERSION;  Surgeon: [name], MD;  Location: [facility];  Service: Cardiovascular;  Laterality: N/A;   CARDIOVERSION N/A 08/23/2014   Procedure: CARDIOVERSION;  Surgeon: [name], MD;  Location: [facility] ENDOSCOPY;  Service: Cardiovascular;  Laterality: N/A;   INGUINAL HERNIA REPAIR     right   LUMBAR LAMINECTOMY/DECOMPRESSION MICRODISCECTOMY Bilateral 02/08/2015   Procedure: Laminectomy and Foraminotomy - bilateral - Lumbar two-three, three-four, left lumbar four-five with resection of synovial cyst ;  Surgeon: Charlie Pitter, MD;  Location: [facility] NEURO ORS;  Service: Neurosurgery;  Laterality: Bilateral;   spinal tap     VASECTOMY      Current Outpatient Medications  Medication Sig Dispense Refill   acetaminophen (TYLENOL) 500 MG tablet Take  500 mg by mouth every 6 (six) hours as needed (pain).     ALPRAZolam (XANAX) 0.5 MG tablet Take 0.25-0.5 mg by mouth 3 (three) times daily as needed (ear ringing.).     amiodarone (PACERONE) 200 MG tablet Take 1 tablet (200 mg total) by mouth daily. 90 tablet 3   CONTRAVE 8-90 MG TB12 Take 2 tablets by mouth 2 (two) times daily.     doxycycline (VIBRA-TABS) 100 MG tablet Take 100 mg by mouth 2 (two) times daily.  2    hydrochlorothiazide (HYDRODIURIL) 25 MG tablet Take 25 mg by mouth in the morning.     metoprolol succinate (TOPROL-XL) 100 MG 24 hr tablet Take 100 mg by mouth in the morning. Take with or immediately following a meal.     olmesartan (BENICAR) 40 MG tablet Take 40 mg by mouth in the morning.     pantoprazole (PROTONIX) 40 MG tablet Take 40 mg by mouth in the morning.  0   potassium chloride SA (K-DUR,KLOR-CON) 20 MEQ tablet Take 20 mEq by mouth in the morning.     rivaroxaban (XARELTO) 20 MG TABS tablet TAKE 1 TABLET(20 MG) BY MOUTH DAILY WITH SUPPER 90 tablet 1   rosuvastatin (CRESTOR) 20 MG tablet TAKE 1 TABLET(20 MG) BY MOUTH DAILY 90 tablet 1   sertraline (ZOLOFT) 100 MG tablet Take 100 mg by mouth in the morning.  0   No current facility-administered medications for this encounter.    No Known Allergies  Social History   Socioeconomic History   Marital status: Married    Spouse name: Dresden Lozito   Number of children: 2   Years of education: College   Highest education level: Not on file  Occupational History   Occupation: Retired  Tobacco Use   Smoking status: Never   Smokeless tobacco: Never   Tobacco comments:    Never smoke 11/23/22  Vaping Use   Vaping Use: Never used  Substance and Sexual Activity   Alcohol use: No   Drug use: No   Sexual activity: Not on file  Other Topics Concern   Not on file  Social History Narrative   Patient lives at home with spouse.   Caffeine Use: 1 cup daily   Social Determinants of Health   Financial Resource Strain: Not on file  Food Insecurity: Not on file  Transportation Needs: Not on file  Physical Activity: Not on file  Stress: Not on file  Social Connections: Not on file  Intimate Partner Violence: Not on file     ROS- All systems are reviewed and negative except as per the HPI above.  Physical Exam: Vitals:   11/23/22 0825  BP: 126/82  Pulse: 67  Weight: (!) 163.7 kg  Height: '5\' 9"'$  (1.753 m)     GEN-  The patient is a well appearing obese male, alert and oriented x 3 today.   HEENT-head normocephalic, atraumatic, sclera clear, conjunctiva pink, hearing intact, trachea midline. Lungs- Clear to ausculation bilaterally, normal work of breathing Heart- Regular rate and rhythm, no murmurs, rubs or gallops  GI- soft, NT, ND, + BS Extremities- no clubbing, cyanosis, or edema MS- no significant deformity or atrophy Skin- no rash or lesion Psych- euthymic mood, full affect Neuro- strength and sensation are intact   Wt Readings from Last 3 Encounters:  11/23/22 (!) 163.7 kg  10/28/22 (!) 158.3 kg  10/12/22 (!) 162.9 kg    EKG today demonstrates  SR with bigeminal PACs Vent. rate  67 BPM PR interval * ms QRS duration 98 ms QT/QTcB 432/456 ms  Echo 12/13/19  1. Left ventricular ejection fraction, by visual estimation, is 60 to  65%. The left ventricle has normal function. There is no left ventricular  hypertrophy.   2. Left ventricular diastolic function could not be evaluated.   3. The left ventricle has no regional wall motion abnormalities.   4. Global right ventricle has normal systolic function.The right  ventricular size is moderately enlarged. No increase in right ventricular  wall thickness.   5. Left atrial size was severely dilated.   6. Right atrial size was moderately dilated.   7. The mitral valve is normal in structure. No evidence of mitral valve  regurgitation.   8. The tricuspid valve is not well visualized. Tricuspid valve  regurgitation is not demonstrated.   9. The aortic valve was not well visualized. Aortic valve regurgitation  is not visualized. No evidence of aortic valve sclerosis or stenosis.  10. The pulmonic valve was not well visualized. Pulmonic valve  regurgitation is not visualized.  11. There is moderate dilatation of the ascending aorta measuring 49 mm.  12. TR signal is inadequate for assessing pulmonary artery systolic  pressure.  13. The  inferior vena cava is dilated in size with >50% respiratory  variability, suggesting right atrial pressure of 8 mmHg.    Epic records are reviewed at length today   CHA2DS2-VASc Score = 4  The patient's score is based upon: CHF History: 0 HTN History: 1 Diabetes History: 0 Stroke History: 0 Vascular Disease History: 1 Age Score: 2 Gender Score: 0       ASSESSMENT AND PLAN: 1. Persistent Atrial Fibrillation (ICD10:  I48.19) The patient's CHA2DS2-VASc score is 4, indicating a 4.8% annual risk of stroke.   Patient appears to be maintaining SR. Continue amiodarone 200 mg daily Continue Toprol 100 mg daily Continue Xarelto 20 mg daily  2. Secondary Hypercoagulable State (ICD10:  D68.69) The patient is at significant risk for stroke/thromboembolism based upon his CHA2DS2-VASc Score of 4.  Continue Rivaroxaban (Xarelto).   3. Obesity Body mass index is 53.31 kg/m. Lifestyle modification was discussed and encouraged including regular physical activity and weight reduction.  4. HTN Stable, no changes today.   Follow up in the AF clinic in 6 months.    [facility] [address] [place], [place] [address] [phone] 11/23/2022 8:38 AM

## 2022-11-24 ENCOUNTER — Encounter: Payer: Self-pay | Admitting: Interventional Cardiology

## 2022-11-24 NOTE — Telephone Encounter (Signed)
   Name: Joseph [name]  DOB: 1941-09-18  MRN: [id number]   Primary Cardiologist: [name], MD  Chart reviewed as part of pre-operative protocol coverage. Patient was contacted 11/24/2022 in reference to pre-operative risk assessment for pending surgery as outlined below.  Joseph [name] was last seen on 11/23/22 by Adline Peals PAC.  Since that day, Joseph [name] has done well. He remains in sinus rhythm. He does not have a history of ischemic heart disease, PCI, or stroke. He reports activity equivalent to greater than 4.0 METS without angina. Per Adline Peals, procedure is greater than 3 weeks from chemical conversion to sinus rhythm and therefor OK to hold xarelto prior to scheduled endoscopy.   Therefore, based on ACC/AHA guidelines, the patient would be at acceptable risk for the planned procedure without further cardiovascular testing.   Per Adline Peals, procedure is greater than 3 weeks from chemical conversion to sinus rhythm and therefor OK to hold xarelto prior to scheduled endoscopy.   Per PharmD,  Patient can hold Xarelto for 2 days prior to procedure.  Patient will not need bridging with Lovenox (enoxaparin) around procedure.   The patient was advised that if he develops new symptoms prior to surgery to contact our office to arrange for a follow-up visit, and he verbalized understanding.  I will route this recommendation to the requesting party via Epic fax function and remove from pre-op pool. Please call with questions.  Ledora Bottcher, PA 11/24/2022, 8:33 AM

## 2022-11-24 NOTE — Telephone Encounter (Signed)
Ricky did not want to provide preoperative risk assessment. Will plan a phone call to clear the patient. This will not be charged.

## 2022-11-24 NOTE — Telephone Encounter (Signed)
Informed patient he can hold his Xarelto 2 days prior to his procedure. Patient verbalized understanding.

## 2022-11-25 ENCOUNTER — Encounter (HOSPITAL_COMMUNITY): Payer: Self-pay | Admitting: Gastroenterology

## 2022-11-26 ENCOUNTER — Encounter: Payer: Self-pay | Admitting: Gastroenterology

## 2022-11-26 NOTE — Progress Notes (Signed)
Attempted to obtain medical history via telephone, unable to reach at this time. HIPAA compliant voicemail message left requesting return call to pre surgical testing department. 

## 2022-12-02 NOTE — Anesthesia Preprocedure Evaluation (Signed)
Anesthesia Evaluation  Patient identified by MRN, date of birth, ID band Patient awake    Reviewed: Allergy & Precautions, NPO status , Patient's Chart, lab work & pertinent test results  History of Anesthesia Complications (+) PONV and history of anesthetic complications  Airway Mallampati: III  TM Distance: >3 FB Neck ROM: Full    Dental  (+)    Pulmonary neg pulmonary ROS   Pulmonary exam normal breath sounds clear to auscultation       Cardiovascular hypertension (HCTZ, metoprolol, olmesartan), Pt. on home beta blockers and Pt. on medications (-) angina (-) Past MI, (-) Cardiac Stents and (-) CABG + dysrhythmias (1st degree AVB, RBBB; s/p cardioversion 07/2014, on amiodarone and Xarelto) Atrial Fibrillation  Rhythm:Regular Rate:Normal  Thoracic aneurysm (reports stable since 2009), HLD  TTE 12/13/2019: IMPRESSIONS     1. Left ventricular ejection fraction, by visual estimation, is 60 to  65%. The left ventricle has normal function. There is no left ventricular  hypertrophy.   2. Left ventricular diastolic function could not be evaluated.   3. The left ventricle has no regional wall motion abnormalities.   4. Global right ventricle has normal systolic function.The right  ventricular size is moderately enlarged. No increase in right ventricular  wall thickness.   5. Left atrial size was severely dilated.   6. Right atrial size was moderately dilated.   7. The mitral valve is normal in structure. No evidence of mitral valve  regurgitation.   8. The tricuspid valve is not well visualized. Tricuspid valve  regurgitation is not demonstrated.   9. The aortic valve was not well visualized. Aortic valve regurgitation  is not visualized. No evidence of aortic valve sclerosis or stenosis.  10. The pulmonic valve was not well visualized. Pulmonic valve  regurgitation is not visualized.  11. There is moderate dilatation of the  ascending aorta measuring 49 mm.  12. TR signal is inadequate for assessing pulmonary artery systolic  pressure.  13. The inferior vena cava is dilated in size with >50% respiratory  variability, suggesting right atrial pressure of 8 mmHg.     Neuro/Psych  Headaches PSYCHIATRIC DISORDERS (panic attacks) Anxiety Depression     Neuromuscular disease (lumbar stenosis)    GI/Hepatic Neg liver ROS,GERD  ,,diverticulosis   Endo/Other  negative endocrine ROS    Renal/GU negative Renal ROS     Musculoskeletal   Abdominal   Peds  Hematology negative hematology ROS (+)   Anesthesia Other Findings K 5.3 on 10/12/22  Last Xarelto 11/29/22  Reproductive/Obstetrics                             Anesthesia Physical Anesthesia Plan  ASA: 3  Anesthesia Plan: MAC   Post-op Pain Management:    Induction: Intravenous  PONV Risk Score and Plan: 2 and Propofol infusion and Treatment may vary due to age or medical condition  Airway Management Planned: Natural Airway and Nasal Cannula  Additional Equipment:   Intra-op Plan:   Post-operative Plan:   Informed Consent: I have reviewed the patients History and Physical, chart, labs and discussed the procedure including the risks, benefits and alternatives for the proposed anesthesia with the patient or authorized representative who has indicated his/her understanding and acceptance.     Dental advisory given  Plan Discussed with: CRNA and Anesthesiologist  Anesthesia Plan Comments: (Discussed with patient risks of MAC including, but not limited to, minor pain or discomfort, hearing  people in the room, and possible need for backup general anesthesia. Risks for general anesthesia also discussed including, but not limited to, sore throat, hoarse voice, chipped/damaged teeth, injury to vocal cords, nausea and vomiting, allergic reactions, lung infection, heart attack, stroke, and death. All questions answered. )         Anesthesia Quick Evaluation

## 2022-12-03 ENCOUNTER — Ambulatory Visit (HOSPITAL_COMMUNITY): Payer: Medicare PPO | Admitting: Anesthesiology

## 2022-12-03 ENCOUNTER — Encounter (HOSPITAL_COMMUNITY): Payer: Self-pay | Admitting: Gastroenterology

## 2022-12-03 ENCOUNTER — Ambulatory Visit (HOSPITAL_COMMUNITY)
Admission: RE | Admit: 2022-12-03 | Discharge: 2022-12-03 | Disposition: A | Discharge status: Home / Self Care | Payer: Medicare PPO | Source: Ambulatory Visit | Attending: Gastroenterology | Admitting: Gastroenterology

## 2022-12-03 ENCOUNTER — Ambulatory Visit (HOSPITAL_BASED_OUTPATIENT_CLINIC_OR_DEPARTMENT_OTHER): Payer: Medicare PPO | Admitting: Anesthesiology

## 2022-12-03 ENCOUNTER — Encounter (HOSPITAL_COMMUNITY)
Admission: RE | Disposition: A | Discharge status: Home / Self Care | Payer: Self-pay | Source: Ambulatory Visit | Attending: Gastroenterology

## 2022-12-03 ENCOUNTER — Other Ambulatory Visit: Payer: Self-pay

## 2022-12-03 DIAGNOSIS — D126 Benign neoplasm of colon, unspecified: Secondary | ICD-10-CM

## 2022-12-03 DIAGNOSIS — Z7901 Long term (current) use of anticoagulants: Secondary | ICD-10-CM | POA: Diagnosis not present

## 2022-12-03 DIAGNOSIS — D125 Benign neoplasm of sigmoid colon: Secondary | ICD-10-CM

## 2022-12-03 DIAGNOSIS — I4819 Other persistent atrial fibrillation: Secondary | ICD-10-CM | POA: Diagnosis not present

## 2022-12-03 DIAGNOSIS — D509 Iron deficiency anemia, unspecified: Secondary | ICD-10-CM

## 2022-12-03 DIAGNOSIS — F418 Other specified anxiety disorders: Secondary | ICD-10-CM

## 2022-12-03 DIAGNOSIS — K5520 Angiodysplasia of colon without hemorrhage: Secondary | ICD-10-CM | POA: Insufficient documentation

## 2022-12-03 DIAGNOSIS — D214 Benign neoplasm of connective and other soft tissue of abdomen: Secondary | ICD-10-CM | POA: Diagnosis not present

## 2022-12-03 DIAGNOSIS — I10 Essential (primary) hypertension: Secondary | ICD-10-CM | POA: Diagnosis not present

## 2022-12-03 DIAGNOSIS — Z6841 Body Mass Index (BMI) 40.0 and over, adult: Secondary | ICD-10-CM | POA: Diagnosis not present

## 2022-12-03 DIAGNOSIS — E785 Hyperlipidemia, unspecified: Secondary | ICD-10-CM | POA: Insufficient documentation

## 2022-12-03 DIAGNOSIS — K5730 Diverticulosis of large intestine without perforation or abscess without bleeding: Secondary | ICD-10-CM | POA: Insufficient documentation

## 2022-12-03 DIAGNOSIS — E669 Obesity, unspecified: Secondary | ICD-10-CM | POA: Diagnosis not present

## 2022-12-03 DIAGNOSIS — K31819 Angiodysplasia of stomach and duodenum without bleeding: Secondary | ICD-10-CM | POA: Diagnosis not present

## 2022-12-03 DIAGNOSIS — R195 Other fecal abnormalities: Secondary | ICD-10-CM | POA: Diagnosis not present

## 2022-12-03 DIAGNOSIS — I7121 Aneurysm of the ascending aorta, without rupture: Secondary | ICD-10-CM | POA: Diagnosis not present

## 2022-12-03 DIAGNOSIS — K317 Polyp of stomach and duodenum: Secondary | ICD-10-CM

## 2022-12-03 DIAGNOSIS — K640 First degree hemorrhoids: Secondary | ICD-10-CM | POA: Insufficient documentation

## 2022-12-03 DIAGNOSIS — K319 Disease of stomach and duodenum, unspecified: Secondary | ICD-10-CM | POA: Diagnosis not present

## 2022-12-03 DIAGNOSIS — K449 Diaphragmatic hernia without obstruction or gangrene: Secondary | ICD-10-CM | POA: Diagnosis not present

## 2022-12-03 DIAGNOSIS — Z1211 Encounter for screening for malignant neoplasm of colon: Secondary | ICD-10-CM

## 2022-12-03 DIAGNOSIS — K2100 Gastro-esophageal reflux disease with esophagitis, without bleeding: Secondary | ICD-10-CM

## 2022-12-03 DIAGNOSIS — D123 Benign neoplasm of transverse colon: Secondary | ICD-10-CM | POA: Insufficient documentation

## 2022-12-03 HISTORY — PX: ESOPHAGOGASTRODUODENOSCOPY (EGD) WITH PROPOFOL: SHX5813

## 2022-12-03 HISTORY — PX: POLYPECTOMY: SHX5525

## 2022-12-03 HISTORY — PX: BIOPSY: SHX5522

## 2022-12-03 HISTORY — PX: COLONOSCOPY WITH PROPOFOL: SHX5780

## 2022-12-03 SURGERY — ESOPHAGOGASTRODUODENOSCOPY (EGD) WITH PROPOFOL
Anesthesia: Monitor Anesthesia Care

## 2022-12-03 MED ORDER — PHENYLEPHRINE 80 MCG/ML (10ML) SYRINGE FOR IV PUSH (FOR BLOOD PRESSURE SUPPORT)
PREFILLED_SYRINGE | INTRAVENOUS | Status: DC | PRN
  Administered 2022-12-03: 80 ug via INTRAVENOUS

## 2022-12-03 MED ORDER — SODIUM CHLORIDE 0.9 % IV SOLN: INTRAVENOUS | Status: DC

## 2022-12-03 MED ORDER — PROPOFOL 500 MG/50ML IV EMUL
INTRAVENOUS | Status: DC | PRN
  Administered 2022-12-03: 120 ug/kg/min via INTRAVENOUS
  Administered 2022-12-03: 20 mg via INTRAVENOUS

## 2022-12-03 MED ORDER — LIDOCAINE 2% (20 MG/ML) 5 ML SYRINGE
INTRAMUSCULAR | Status: DC | PRN
  Administered 2022-12-03: 60 mg via INTRAVENOUS

## 2022-12-03 MED ORDER — PROPOFOL 500 MG/50ML IV EMUL
INTRAVENOUS | Status: AC
  Filled 2022-12-03: qty 50

## 2022-12-03 MED ORDER — EPHEDRINE SULFATE-NACL 50-0.9 MG/10ML-% IV SOSY
PREFILLED_SYRINGE | INTRAVENOUS | Status: DC | PRN
  Administered 2022-12-03 (×2): 5 mg via INTRAVENOUS

## 2022-12-03 MED ORDER — LACTATED RINGERS IV SOLN: INTRAVENOUS | Status: DC

## 2022-12-03 MED ORDER — PROPOFOL 1000 MG/100ML IV EMUL
INTRAVENOUS | Status: AC
  Filled 2022-12-03: qty 100

## 2022-12-03 SURGICAL SUPPLY — 25 items

## 2022-12-03 NOTE — Transfer of Care (Signed)
Immediate Anesthesia Transfer of Care Note  Patient: NIKASH MORTENSEN  Procedure(s) Performed: ESOPHAGOGASTRODUODENOSCOPY (EGD) WITH PROPOFOL COLONOSCOPY WITH PROPOFOL POLYPECTOMY BIOPSY  Patient Location: PACU and Endoscopy Unit  Anesthesia Type:MAC  Level of Consciousness: awake, alert , oriented, and patient cooperative  Airway & Oxygen Therapy: Patient Spontanous Breathing and Patient connected to face mask oxygen  Post-op Assessment: Report given to RN, Post -op Vital signs reviewed and stable, and Patient moving all extremities  Post vital signs: Reviewed and stable  Last Vitals:  Vitals Value Taken Time  BP 86/28 12/03/22 0820  Temp    Pulse 61 12/03/22 0822  Resp 12 12/03/22 0822  SpO2 100 % 12/03/22 0822  Vitals shown include unvalidated device data.  Last Pain:  Vitals:   12/03/22 0710  TempSrc: Temporal  PainSc: 0-No pain         Complications: No notable events documented.

## 2022-12-03 NOTE — Op Note (Signed)
[facility] Patient Name: Joseph [name] Procedure Date: 12/03/2022 MRN: [id number] Attending MD: Ladene Artist , MD, [id number] Date of Birth: [birth year] CSN: [id number] Age: 81 Admit Type: Outpatient Procedure:                Colonoscopy Indications:              Heme positive stool, Unexplained iron deficiency                            anemia Providers:                Pricilla Riffle. Fuller Plan, MD, Jeanella Cara, RN,                            Frazier Richards, Technician Referring MD:             Judithann Sauger, MD Medicines:                Monitored Anesthesia Care Complications:            No immediate complications. Estimated blood loss:                            None. Estimated Blood Loss:     Estimated blood loss: none. Procedure:                Pre-Anesthesia Assessment:                           - Prior to the procedure, a History and Physical                            was performed, and patient medications and                            allergies were reviewed. The patient's tolerance of                            previous anesthesia was also reviewed. The risks                            and benefits of the procedure and the sedation                            options and risks were discussed with the patient.                            All questions were answered, and informed consent                            was obtained. Prior Anticoagulants: The patient has                            taken Xarelto (rivaroxaban), last dose was 2 days                            prior to  procedure. ASA Grade Assessment: III - A                            patient with severe systemic disease. After                            reviewing the risks and benefits, the patient was                            deemed in satisfactory condition to undergo the                            procedure.                           After obtaining informed consent, the colonoscope                             was passed under direct vision. Throughout the                            procedure, the patient's blood pressure, pulse, and                            oxygen saturations were monitored continuously. The                            [id number]-[id number] ([id number]) Olympus colonoscope was                            introduced through the anus and advanced to the the                            cecum, identified by appendiceal orifice and                            ileocecal valve. The ileocecal valve, appendiceal                            orifice, and rectum were photographed. The quality                            of the bowel preparation was adequate after                            extensive lavage, suction. The colonoscopy was                            performed without difficulty. The patient tolerated                            the procedure well. Scope In: 7:47:16 AM Scope Out: 8:07:24 AM Scope Withdrawal Time: 0 hours 17 minutes 29 seconds  Total Procedure Duration: 0 hours 20 minutes 8 seconds  Findings:  The perianal and digital rectal examinations were normal.      Four medium-sized localized angioectasias without bleeding were found in       the cecum (3) and at the ileocecal valve (1).      Four sessile polyps were found in the sigmoid colon (1), transverse       colon (2) and hepatic flexure (1). The polyps were 6 to 8 mm in size.       These polyps were removed with a cold snare. Resection and retrieval       were complete.      Multiple medium-mouthed diverticula were found in the left colon. There       was no evidence of diverticular bleeding.      Internal hemorrhoids were found during retroflexion. The hemorrhoids       were small and Grade I (internal hemorrhoids that do not prolapse).      The exam was otherwise without abnormality on direct and retroflexion       views. Impression:               - Four non-bleeding colonic angioectasias.                            - Four 6 to 8 mm polyps in the sigmoid colon, in                            the transverse colon and at the hepatic flexure,                            removed with a cold snare. Resected and retrieved.                           - Moderate diverticulosis in the left colon.                           - Internal hemorrhoids.                           - The examination was otherwise normal on direct                            and retroflexion views. Moderate Sedation:      Not Applicable - Patient had care per Anesthesia. Recommendation:           - Resume Xarelto (rivaroxaban) in 3 days at prior                            dose. Refer to managing physician for further                            adjustment of therapy.                           - Patient has a contact number available for                            emergencies. The signs and symptoms of potential  delayed complications were discussed with the                            patient. Return to normal activities tomorrow.                            Written discharge instructions were provided to the                            patient.                           - High fiber diet.                           - Continue present medications.                           - Await pathology results.                           - No repeat colonoscopy due to age. Procedure Code(s):        --- Professional ---                           [phone], Colonoscopy, flexible; with removal of                            tumor(s), polyp(s), or other lesion(s) by snare                            technique Diagnosis Code(s):        --- Professional ---                           K55.20, Angiodysplasia of colon without hemorrhage                           D12.5, Benign neoplasm of sigmoid colon                           D12.3, Benign neoplasm of transverse colon (hepatic                            flexure or splenic flexure)                            K64.0, First degree hemorrhoids                           R19.5, Other fecal abnormalities                           D50.9, Iron deficiency anemia, unspecified                           K57.30, Diverticulosis of large intestine without  perforation or abscess without bleeding CPT copyright 2022 American [employer] Association. All rights reserved. The codes documented in this report are preliminary and upon coder review may  be revised to meet current compliance requirements. Ladene Artist, MD 12/03/2022 8:22:02 AM This report has been signed electronically. Number of Addenda: 0

## 2022-12-03 NOTE — Progress Notes (Signed)
Uses walker "back pain".

## 2022-12-03 NOTE — Discharge Instructions (Signed)

## 2022-12-03 NOTE — Interval H&P Note (Signed)
History and Physical Interval Note:  12/03/2022 7:31 AM  Joseph [name]  has presented today for surgery, with the diagnosis of Occult blood in stool, Anemia.  The various methods of treatment have been discussed with the patient and family. After consideration of risks, benefits and other options for treatment, the patient has consented to  Procedure(s): ESOPHAGOGASTRODUODENOSCOPY (EGD) WITH PROPOFOL (N/A) COLONOSCOPY WITH PROPOFOL (N/A) as a surgical intervention.  The patient's history has been reviewed, patient examined, no change in status, stable for surgery.  I have reviewed the patient's chart and labs.  Questions were answered to the patient's satisfaction.     Pricilla Riffle. Fuller Plan

## 2022-12-03 NOTE — Op Note (Signed)
[facility] Patient Name: Joseph [name] Procedure Date: 12/03/2022 MRN: [id number] Attending MD: Ladene Artist , MD, [id number] Date of Birth: March 07, 1941 CSN: [id number] Age: 81 Admit Type: Outpatient Procedure:                Upper GI endoscopy Indications:              Unexplained iron deficiency anemia, Heme positive                            stool Providers:                Pricilla Riffle. Fuller Plan, MD, Jeanella Cara, RN,                            Frazier Richards, Technician Referring MD:             Judithann Sauger, MD Medicines:                Monitored Anesthesia Care Complications:            No immediate complications. Estimated Blood Loss:     Estimated blood loss was minimal. Procedure:                Pre-Anesthesia Assessment:                           - Prior to the procedure, a History and Physical                            was performed, and patient medications and                            allergies were reviewed. The patient's tolerance of                            previous anesthesia was also reviewed. The risks                            and benefits of the procedure and the sedation                            options and risks were discussed with the patient.                            All questions were answered, and informed consent                            was obtained. Prior Anticoagulants: The patient has                            taken Xarelto (rivaroxaban), last dose was 2 days                            prior to procedure. ASA Grade Assessment: III - A  patient with severe systemic disease. After                            reviewing the risks and benefits, the patient was                            deemed in satisfactory condition to undergo the                            procedure.                           After obtaining informed consent, the endoscope was                            passed under direct  vision. Throughout the                            procedure, the patient's blood pressure, pulse, and                            oxygen saturations were monitored continuously. The                            [id number]-H190 ([id number]) Olympus endoscope was introduced                            through the mouth, and advanced to the second part                            of duodenum. The upper GI endoscopy was                            accomplished without difficulty. The patient                            tolerated the procedure well. Scope In: Scope Out: Findings:      The examined esophagus was normal.      Two 6 mm sessile polyps with no bleeding and no stigmata of recent       bleeding were found in the cardia. Biopsies were taken with a cold       forceps for histology.      A small hiatal hernia was present.      Mild gastric antral vascular ectasia without bleeding was present in the       gastric antrum.      The exam of the stomach was otherwise normal.      The duodenal bulb and second portion of the duodenum were normal. Impression:               - Normal esophagus.                           - Two gastric polyps. Biopsied.                           -  Small hiatal hernia.                           - Gastric antral vascular ectasia without bleeding.                           - Normal duodenal bulb and second portion of the                            duodenum. Moderate Sedation:      Not Applicable - Patient had care per Anesthesia. Recommendation:           - Patient has a contact number available for                            emergencies. The signs and symptoms of potential                            delayed complications were discussed with the                            patient. Return to normal activities tomorrow.                            Written discharge instructions were provided to the                            patient.                           - Resume previous  diet.                           - Continue present medications.                           - Resume Xarelto (rivaroxaban) at prior dose in 3                            days. Refer to managing physician for further                            adjustment of therapy.                           - Await pathology results. Procedure Code(s):        --- Professional ---                           [id number], Esophagogastroduodenoscopy, flexible,                            transoral; with biopsy, single or multiple Diagnosis Code(s):        --- Professional ---                           K31.7, Polyp of  stomach and duodenum                           K44.9, Diaphragmatic hernia without obstruction or                            gangrene                           K31.819, Angiodysplasia of stomach and duodenum                            without bleeding                           D50.9, Iron deficiency anemia, unspecified                           R19.5, Other fecal abnormalities CPT copyright 2022 American Medical Association. All rights reserved. The codes documented in this report are preliminary and upon coder review may  be revised to meet current compliance requirements. Ladene Artist, MD 12/03/2022 8:26:11 AM This report has been signed electronically. Number of Addenda: 0

## 2022-12-03 NOTE — Anesthesia Postprocedure Evaluation (Signed)
Anesthesia Post Note  Patient: Joseph [name]  Procedure(s) Performed: ESOPHAGOGASTRODUODENOSCOPY (EGD) WITH PROPOFOL COLONOSCOPY WITH PROPOFOL POLYPECTOMY BIOPSY     Patient location during evaluation: PACU Anesthesia Type: MAC Level of consciousness: awake Pain management: pain level controlled Vital Signs Assessment: post-procedure vital signs reviewed and stable Respiratory status: spontaneous breathing, nonlabored ventilation and respiratory function stable Cardiovascular status: stable and blood pressure returned to baseline Postop Assessment: no apparent nausea or vomiting Anesthetic complications: no   No notable events documented.  Last Vitals:  Vitals:   12/03/22 0840 12/03/22 0850  BP: (!) 102/52 103/73  Pulse: (!) 59 (!) 59  Resp: 20 20  Temp:    SpO2: 96% 95%    Last Pain:  Vitals:   12/03/22 0850  TempSrc:   PainSc: 0-No pain                 Nilda Simmer

## 2022-12-04 LAB — SURGICAL PATHOLOGY

## 2022-12-07 ENCOUNTER — Encounter (HOSPITAL_COMMUNITY): Payer: Self-pay | Admitting: Gastroenterology

## 2022-12-08 ENCOUNTER — Other Ambulatory Visit: Payer: Self-pay | Admitting: Interventional Cardiology

## 2022-12-08 DIAGNOSIS — I480 Paroxysmal atrial fibrillation: Secondary | ICD-10-CM

## 2022-12-08 NOTE — Telephone Encounter (Signed)
Prescription refill request for Xarelto received.  Indication:afib Last office visit:10/23 Weight:162.8  kg Age:81 Scr:1.3 CrCl:102.62  ml/min  Prescription refilled

## 2022-12-09 ENCOUNTER — Encounter: Payer: Self-pay | Admitting: Gastroenterology

## 2022-12-10 DIAGNOSIS — H52203 Unspecified astigmatism, bilateral: Secondary | ICD-10-CM | POA: Diagnosis not present

## 2022-12-10 DIAGNOSIS — H52223 Regular astigmatism, bilateral: Secondary | ICD-10-CM | POA: Diagnosis not present

## 2022-12-10 DIAGNOSIS — Z135 Encounter for screening for eye and ear disorders: Secondary | ICD-10-CM | POA: Diagnosis not present

## 2022-12-10 DIAGNOSIS — H2513 Age-related nuclear cataract, bilateral: Secondary | ICD-10-CM | POA: Diagnosis not present

## 2022-12-10 DIAGNOSIS — H5213 Myopia, bilateral: Secondary | ICD-10-CM | POA: Diagnosis not present

## 2022-12-29 DIAGNOSIS — D6869 Other thrombophilia: Secondary | ICD-10-CM | POA: Diagnosis not present

## 2022-12-29 DIAGNOSIS — I10 Essential (primary) hypertension: Secondary | ICD-10-CM | POA: Diagnosis not present

## 2022-12-29 DIAGNOSIS — E611 Iron deficiency: Secondary | ICD-10-CM | POA: Diagnosis not present

## 2022-12-29 DIAGNOSIS — R918 Other nonspecific abnormal finding of lung field: Secondary | ICD-10-CM | POA: Diagnosis not present

## 2022-12-29 DIAGNOSIS — F419 Anxiety disorder, unspecified: Secondary | ICD-10-CM | POA: Diagnosis not present

## 2022-12-29 DIAGNOSIS — F325 Major depressive disorder, single episode, in full remission: Secondary | ICD-10-CM | POA: Diagnosis not present

## 2022-12-29 DIAGNOSIS — I480 Paroxysmal atrial fibrillation: Secondary | ICD-10-CM | POA: Diagnosis not present

## 2022-12-29 DIAGNOSIS — N183 Chronic kidney disease, stage 3 unspecified: Secondary | ICD-10-CM | POA: Diagnosis not present

## 2022-12-29 DIAGNOSIS — N399 Disorder of urinary system, unspecified: Secondary | ICD-10-CM | POA: Diagnosis not present

## 2023-01-01 DIAGNOSIS — R3914 Feeling of incomplete bladder emptying: Secondary | ICD-10-CM | POA: Diagnosis not present

## 2023-01-01 DIAGNOSIS — N401 Enlarged prostate with lower urinary tract symptoms: Secondary | ICD-10-CM | POA: Diagnosis not present

## 2023-01-14 ENCOUNTER — Encounter (HOSPITAL_BASED_OUTPATIENT_CLINIC_OR_DEPARTMENT_OTHER): Payer: Self-pay | Admitting: Pulmonary Disease

## 2023-01-14 ENCOUNTER — Ambulatory Visit (INDEPENDENT_AMBULATORY_CARE_PROVIDER_SITE_OTHER): Payer: Medicare PPO | Admitting: Pulmonary Disease

## 2023-01-14 VITALS — BP 110/70 | HR 57 | Ht 69.0 in | Wt 372.1 lb

## 2023-01-14 DIAGNOSIS — R9389 Abnormal findings on diagnostic imaging of other specified body structures: Secondary | ICD-10-CM | POA: Diagnosis not present

## 2023-01-14 DIAGNOSIS — R0602 Shortness of breath: Secondary | ICD-10-CM

## 2023-01-14 NOTE — Progress Notes (Signed)
Subjective:   PATIENT ID: Joseph [name] GENDER: male DOB: Oct 12, 1941, MRN: [id number]  Chief Complaint  Patient presents with   Consult    Abnormal CT scan    Reason for Visit: New consult for abnormal CT  [name] is a 82 year old male never smoker with HTN, depression, anxiety, CKD III, atrial fibrillation, secondary hypercoagulable state, GERD, HLD who present for evaluation for CT. Presents with wife.  Referred by his PCP for incidental pulmonary findings during surveillance of his thoracic aneurysm.  He reports shortness of breath with activity. Worsened in the last four months. May have had atypical viral illness but not sure. Denies associated cough, wheezing, chest pain. Before this time point he felt like he was more ambulatory on his walker but currently has had to slow down. Activity limited by back pain and atrial fib.  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  Past Medical History:  Diagnosis Date   Allergy    Atrial fibrillation ([place]) 11/2008   Atrial fibrillation (HCC)    Atypical atrial flutter ([place]) 07/13/2014   Diagnosis on EKG 07/13/14 : Cardioversion August 2015   Colon polyp    Depression    Diverticulosis    Dysrhythmia    GERD (gastroesophageal reflux disease)    Headache(784.0)    Hyperlipidemia    Hypertension    Panic attacks    PONV (postoperative nausea and vomiting)    Thoracic aneurysm      Family History  Problem Relation Age of Onset   Heart disease Mother 59       mother   Heart attack Mother    Lung cancer Father    Heart disease Father    Kidney disease Brother 69       died age 78 heart attack   Heart attack Brother    Heart disease Maternal Grandfather    Colon cancer Neg Hx      Social History   Occupational History   Occupation: Retired   Tobacco Use   Smoking status: Never   Smokeless tobacco: Never   Tobacco comments:    Never smoke 11/23/22  Vaping Use   Vaping Use: Never used  Substance and Sexual Activity   Alcohol use: No   Drug use: No   Sexual activity: Not on file    No Known Allergies   Outpatient Medications Prior to Visit  Medication Sig Dispense Refill   acetaminophen (TYLENOL) 500 MG tablet Take 500 mg by mouth every 6 (six) hours as needed (pain).     ALPRAZolam (XANAX) 0.5 MG tablet Take 0.25-0.5 mg by mouth 3 (three) times daily as needed (ear ringing.).     amiodarone (PACERONE) 200 MG tablet Take 1 tablet (200 mg total) by mouth daily. 90 tablet 3   Azelaic Acid (FINACEA) 15 % gel 1 application Externally Twice a day     Cholecalciferol (VITAMIN D3) 25 MCG (1000 UT) CAPS 1 capsule Orally Once a day     CONTRAVE 8-90 MG TB12 Take 2 tablets by mouth 2 (two) times daily.     cyanocobalamin (VITAMIN B12) 1000 MCG tablet 1 tablet Orally Once a day     doxycycline (VIBRA-TABS) 100 MG tablet Take 100 mg by mouth 2 (two) times daily.  2   Ferrous Sulfate (IRON) 325 (65 Fe) MG TABS 1 tablet Orally Three times a  Week     hydrochlorothiazide (HYDRODIURIL) 25 MG tablet Take 25 mg by mouth in the morning.     metoprolol succinate (TOPROL-XL) 100 MG 24 hr tablet Take 100 mg by mouth in the morning. Take with or immediately following a meal.     Multiple Minerals-Vitamins (CAL-MAG-ZINC-D) TABS 1 tablet Orally once a day     Multiple Vitamins-Minerals (CENTRUM SILVER) tablet as directed Orally     olmesartan (BENICAR) 40 MG tablet Take 40 mg by mouth in the morning.     pantoprazole (PROTONIX) 40 MG tablet Take 40 mg by mouth in the morning.  0   potassium chloride SA (K-DUR,KLOR-CON) 20 MEQ tablet Take 20 mEq by mouth in the morning.     rosuvastatin (CRESTOR) 20 MG tablet TAKE 1 TABLET(20 MG) BY MOUTH DAILY 90 tablet 1   sertraline (ZOLOFT) 100 MG tablet Take 100 mg by mouth in the morning.  0   XARELTO 20 MG  TABS tablet TAKE 1 TABLET(20 MG) BY MOUTH DAILY WITH SUPPER 90 tablet 1   No facility-administered medications prior to visit.    Review of Systems  Constitutional:  Negative for chills, diaphoresis, fever, malaise/fatigue and weight loss.  HENT:  Negative for congestion.   Respiratory:  Positive for shortness of breath. Negative for cough, hemoptysis, sputum production and wheezing.   Cardiovascular:  Negative for chest pain, palpitations and leg swelling.     Objective:   Vitals:   01/14/23 0923  BP: 110/70  Pulse: (!) 57  SpO2: 98%  Weight: (!) 372 lb 2.2 oz (168.8 kg)  Height: '5\' 9"'$  (1.753 m)   SpO2: 98 % O2 Device: None (Room air)  Physical Exam: General: Well-appearing, no acute distress HENT: [place], AT Eyes: EOMI, no scleral icterus Respiratory: Clear to auscultation bilaterally.  No crackles, wheezing or rales Cardiovascular: RRR, -M/R/G, no JVD Extremities:-Edema,-tenderness Neuro: AAO x4, CNII-XII grossly intact Psych: Normal mood, normal affect  Data Reviewed:  Imaging: CTA 10/21/22 - Stable thoracic aneurysm. Bilateral upper lobe predominant ill-defined GGO, nonspecific  PFT: None on file  Labs: CBC    Component Value Date/Time   WBC 6.6 09/22/2021 0855   WBC 5.6 01/31/2015 1400   RBC 4.63 09/22/2021 0855   RBC 4.94 01/31/2015 1400   HGB 10.1 (L) 10/12/2022 1407   HCT 38.9 09/22/2021 0855   PLT 270 09/22/2021 0855   MCV 84 09/22/2021 0855   MCH 26.8 09/22/2021 0855   MCH 31.4 01/31/2015 1400   MCHC 31.9 09/22/2021 0855   MCHC 35.1 01/31/2015 1400   RDW 14.8 09/22/2021 0855   LYMPHSABS 0.7 01/31/2015 1400   MONOABS 0.5 01/31/2015 1400   EOSABS 0.0 01/31/2015 1400   BASOSABS 0.0 01/31/2015 1400   Chronic anemia     Assessment & Plan:   Discussion: 82 year old male never smoker with HTN, depression, anxiety, CKD III, atrial fibrillation, secondary hypercoagulable state, GERD, HLD who present for evaluation for CT. Reviewed and discussed  CT. Will repeat CT scan to determine if this is a chronic process. Suspect infectious/inflammatory etiology at this time. May need bronchoscopy if findings persist however would wait until symptoms or radiologic findings worsen before pursuing invasive diagnostic testing.   Abnormal CT: Possible atypical infection vs viral --ORDER CT Chest without contrast in Feb  --Follow-up after CT to discuss repeat imaging  Shortness of breath --ORDER pulmonary function test prior to [place] Maintenance Immunization History  Administered Date(s) Administered   DTaP 02/12/2007   PFIZER(Purple  Top)SARS-COV-2 Vaccination 01/06/2020, 01/27/2020, 01/27/2020, 09/24/2020   Pneumococcal Conjugate-13 02/12/2007   CT Lung Screen - not qualified  Orders Placed This Encounter  Procedures   CT Chest Wo Contrast    Standing Status:   Future    Standing Expiration Date:   01/15/2024    Scheduling Instructions:     To be completed end of Jan    Order Specific Question:   Preferred imaging location?    Answer:   [facility]   Pulmonary function test    Standing Status:   Future    Standing Expiration Date:   01/15/2024    Order Specific Question:   Where should this test be performed?    Answer:   [facility]    Order Specific Question:   Full PFT: includes the following: basic spirometry, spirometry pre & post bronchodilator, diffusion capacity (DLCO), lung volumes    Answer:   Full PFT  No orders of the defined types were placed in this encounter.   Return in about 3 weeks (around 02/04/2023).  I have spent a total time of 45-minutes on the day of the appointment reviewing prior documentation, coordinating care and discussing medical diagnosis and plan with the patient/family. Imaging, labs and tests included in this note have been reviewed and interpreted independently by me.  [place], MD [facility] 01/14/2023 10:08 AM  Office Number [phone]

## 2023-01-14 NOTE — Patient Instructions (Signed)
Abnormal CT: Possible atypical infection vs viral --ORDER CT Chest without contrast in Feb  --Follow-up after CT to discuss repeat imaging  Shortness of breath --ORDER pulmonary function test prior to vist  Follow-up with me in Feb with PFTs prior to visit

## 2023-01-26 ENCOUNTER — Ambulatory Visit (HOSPITAL_BASED_OUTPATIENT_CLINIC_OR_DEPARTMENT_OTHER)
Admission: RE | Admit: 2023-01-26 | Discharge: 2023-01-26 | Disposition: A | Discharge status: Home / Self Care | Payer: Medicare PPO | Source: Ambulatory Visit | Attending: Pulmonary Disease | Admitting: Pulmonary Disease

## 2023-01-26 ENCOUNTER — Encounter (HOSPITAL_BASED_OUTPATIENT_CLINIC_OR_DEPARTMENT_OTHER): Payer: Self-pay

## 2023-01-26 DIAGNOSIS — J90 Pleural effusion, not elsewhere classified: Secondary | ICD-10-CM | POA: Diagnosis not present

## 2023-01-26 DIAGNOSIS — R9389 Abnormal findings on diagnostic imaging of other specified body structures: Secondary | ICD-10-CM | POA: Diagnosis not present

## 2023-01-26 DIAGNOSIS — J811 Chronic pulmonary edema: Secondary | ICD-10-CM | POA: Diagnosis not present

## 2023-02-02 ENCOUNTER — Ambulatory Visit (INDEPENDENT_AMBULATORY_CARE_PROVIDER_SITE_OTHER): Payer: Medicare PPO | Admitting: Pulmonary Disease

## 2023-02-02 ENCOUNTER — Encounter (HOSPITAL_BASED_OUTPATIENT_CLINIC_OR_DEPARTMENT_OTHER): Payer: Self-pay | Admitting: Pulmonary Disease

## 2023-02-02 ENCOUNTER — Telehealth (HOSPITAL_BASED_OUTPATIENT_CLINIC_OR_DEPARTMENT_OTHER): Payer: Self-pay | Admitting: *Deleted

## 2023-02-02 VITALS — BP 112/70 | HR 67 | Ht 69.0 in | Wt 371.0 lb

## 2023-02-02 DIAGNOSIS — R0602 Shortness of breath: Secondary | ICD-10-CM

## 2023-02-02 DIAGNOSIS — I501 Left ventricular failure: Secondary | ICD-10-CM | POA: Diagnosis not present

## 2023-02-02 LAB — PULMONARY FUNCTION TEST
DL/VA % pred: 129 %
DL/VA: 5.02 ml/min/mmHg/L
DLCO cor % pred: 75 %
DLCO cor: 17.85 ml/min/mmHg
DLCO unc % pred: 75 %
DLCO unc: 17.85 ml/min/mmHg
FEF 25-75 Post: 0.71 L/sec
FEF 25-75 Pre: 1.7 L/sec
FEF2575-%Change-Post: -58 %
FEF2575-%Pred-Post: 38 %
FEF2575-%Pred-Pre: 93 %
FEV1-%Change-Post: -15 %
FEV1-%Pred-Post: 58 %
FEV1-%Pred-Pre: 68 %
FEV1-Post: 1.57 L
FEV1-Pre: 1.86 L
FEV1FVC-%Change-Post: -6 %
FEV1FVC-%Pred-Pre: 110 %
FEV6-%Change-Post: -9 %
FEV6-%Pred-Post: 59 %
FEV6-%Pred-Pre: 66 %
FEV6-Post: 2.12 L
FEV6-Pre: 2.35 L
FEV6FVC-%Change-Post: 0 %
FEV6FVC-%Pred-Post: 106 %
FEV6FVC-%Pred-Pre: 107 %
FVC-%Change-Post: -9 %
FVC-%Pred-Post: 55 %
FVC-%Pred-Pre: 61 %
FVC-Post: 2.14 L
FVC-Pre: 2.35 L
Post FEV1/FVC ratio: 74 %
Post FEV6/FVC ratio: 99 %
Pre FEV1/FVC ratio: 79 %
Pre FEV6/FVC Ratio: 100 %
RV % pred: 66 %
RV: 1.73 L
TLC % pred: 63 %
TLC: 4.34 L

## 2023-02-02 MED ORDER — FUROSEMIDE 20 MG PO TABS: ORAL_TABLET | ORAL | 0 refills | Status: DC

## 2023-02-02 MED ORDER — ALBUTEROL SULFATE HFA 108 (90 BASE) MCG/ACT IN AERS: 2.0000 | INHALATION_SPRAY | Freq: Four times a day (QID) | RESPIRATORY_TRACT | 3 refills | Status: DC | PRN

## 2023-02-02 NOTE — Patient Instructions (Addendum)
Shortness of breath secondary pulmonary edema Moderately severe restrictive defect 2/2 chest wall compliance +/- volume overload/pulmonary edema --ORDER albuterol AS NEEDED for shortness of breath or wheezing --START lasix 40 mg for three days. STOP HCTZ --Resume HCTZ after completing lasix --Contact cardiologist for sooner appointment if able --ORDER labs: CBC and BMET  Follow up with me in 2 weeks (ok to overbook)

## 2023-02-02 NOTE — Progress Notes (Signed)
Subjective:   PATIENT ID: Joseph [name] GENDER: male DOB: 1941/08/29, MRN: [id number]  Chief Complaint  Patient presents with   Follow-up    CT results PFT results    Reason for Visit: Follow-up shortness of breath  [name] is a 82 year old male never smoker with HTN, depression, anxiety, CKD III, atrial fibrillation, secondary hypercoagulable state, GERD, HLD who presents for follow-up. Presents with wife.  Initial consult Referred by his PCP for incidental pulmonary findings during surveillance of his thoracic aneurysm. He reports shortness of breath with activity. Worsened in the last four months. May have had atypical viral illness but not sure. Denies associated cough, wheezing, chest pain. Before this time point he felt like he was more ambulatory on his walker but currently has had to slow down. Activity limited by back pain and atrial fib.  02/02/23 Since our last visit he remains short of breath with exertion. Denies wheezing or cough. Has had worsening lower extremity swelling. Has raised his legs up for some relief.   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  Past Medical History:  Diagnosis Date   Allergy    Atrial fibrillation ([place]) 11/2008   Atrial fibrillation ([place])    Atypical atrial flutter ([place]) 07/13/2014   Diagnosis on EKG 07/13/14 : Cardioversion August 2015   Colon polyp    Depression    Diverticulosis    Dysrhythmia    GERD (gastroesophageal reflux disease)    Headache(784.0)    Hyperlipidemia    Hypertension    Panic attacks    PONV (postoperative nausea and vomiting)    Thoracic aneurysm      Family History  Problem Relation Age of Onset   Heart disease Mother 71       mother   Heart attack Mother    Lung cancer Father    Heart disease Father    Kidney disease Brother 65       died age 47 heart attack   Heart attack Brother    Heart disease Maternal Grandfather     Colon cancer Neg Hx      Social History   Occupational History   Occupation: Retired  Tobacco Use   Smoking status: Never   Smokeless tobacco: Never   Tobacco comments:    Never smoke 11/23/22  Vaping Use   Vaping Use: Never used  Substance and Sexual Activity   Alcohol use: No   Drug use: No   Sexual activity: Not on file    No Known Allergies   Outpatient Medications Prior to Visit  Medication Sig Dispense Refill   acetaminophen (TYLENOL) 500 MG tablet Take 500 mg by mouth every 6 (six) hours as needed (pain).     ALPRAZolam (XANAX) 0.5 MG tablet Take 0.25-0.5 mg by mouth 3 (three) times daily as needed (ear ringing.).     amiodarone (PACERONE) 200 MG tablet Take 1 tablet (200 mg total) by mouth daily. 90 tablet 3   Azelaic Acid (FINACEA) 15 % gel 1 application Externally Twice a day     Cholecalciferol (VITAMIN D3) 25 MCG (1000 UT) CAPS 1 capsule Orally Once a day     CONTRAVE 8-90 MG TB12 Take 2 tablets by mouth 2 (two) times daily.     cyanocobalamin (VITAMIN B12) 1000 MCG tablet 1 tablet Orally Once a day     Ferrous Sulfate (IRON) 325 (65 Fe) MG TABS 1 tablet Orally Three times  a Week     hydrochlorothiazide (HYDRODIURIL) 25 MG tablet Take 25 mg by mouth in the morning.     metoprolol succinate (TOPROL-XL) 100 MG 24 hr tablet Take 100 mg by mouth in the morning. Take with or immediately following a meal.     Multiple Minerals-Vitamins (CAL-MAG-ZINC-D) TABS 1 tablet Orally once a day     Multiple Vitamins-Minerals (CENTRUM SILVER) tablet as directed Orally     olmesartan (BENICAR) 40 MG tablet Take 40 mg by mouth in the morning.     pantoprazole (PROTONIX) 40 MG tablet Take 40 mg by mouth in the morning.  0   potassium chloride SA (K-DUR,KLOR-CON) 20 MEQ tablet Take 20 mEq by mouth in the morning.     rosuvastatin (CRESTOR) 20 MG tablet TAKE 1 TABLET(20 MG) BY MOUTH DAILY 90 tablet 1   sertraline (ZOLOFT) 100 MG tablet Take 100 mg by mouth in the morning.  0   XARELTO  20 MG TABS tablet TAKE 1 TABLET(20 MG) BY MOUTH DAILY WITH SUPPER 90 tablet 1   doxycycline (VIBRA-TABS) 100 MG tablet Take 100 mg by mouth 2 (two) times daily. (Patient not taking: Reported on 02/02/2023)  2   No facility-administered medications prior to visit.    Review of Systems  Constitutional:  Negative for chills, diaphoresis, fever, malaise/fatigue and weight loss.  HENT:  Negative for congestion.   Respiratory:  Positive for shortness of breath. Negative for cough, hemoptysis, sputum production and wheezing.   Cardiovascular:  Negative for chest pain, palpitations and leg swelling.     Objective:   Vitals:   02/02/23 1030  BP: 112/70  Pulse: 67  SpO2: 97%  Weight: (!) 371 lb (168.3 kg)  Height: '5\' 9"'$  (1.753 m)   SpO2: 97 % O2 Device: None (Room air)  Physical Exam: General: Well-appearing, no acute distress HENT: [place], AT Eyes: EOMI, no scleral icterus Respiratory: Diminished to auscultation at the bases.  No crackles, wheezing or rales Cardiovascular: RRR, -M/R/G, no JVD Extremities:2+ pitting edema in lower extremities, -tenderness Neuro: AAO x4, CNII-XII grossly intact Psych: Normal mood, normal affect  Data Reviewed:  Imaging: CTA 10/21/22 - Stable thoracic aneurysm. Bilateral upper lobe predominant ill-defined GGO, nonspecific CT Chest 01/26/23 - Pulmonary edema. Increased mediastinal and hilar lymph nodes, suspect reactive in setting of edema. Mild diffuse interlobular septal thickening. Prior bilateral upper and RML GGO less prominent. Resolved RML nodule  PFT: 02/02/23 FVC 2.14 (55%) FEV1 1.57 (58%) Ratio 79  TLC 63% DLCO 75% Interpretation: Moderately severe restrictive defect with mild reduction in DLCO   Labs: CBC    Component Value Date/Time   WBC 6.6 09/22/2021 0855   WBC 5.6 01/31/2015 1400   RBC 4.63 09/22/2021 0855   RBC 4.94 01/31/2015 1400   HGB 10.1 (L) 10/12/2022 1407   HCT 38.9 09/22/2021 0855   PLT 270 09/22/2021 0855   MCV 84  09/22/2021 0855   MCH 26.8 09/22/2021 0855   MCH 31.4 01/31/2015 1400   MCHC 31.9 09/22/2021 0855   MCHC 35.1 01/31/2015 1400   RDW 14.8 09/22/2021 0855   LYMPHSABS 0.7 01/31/2015 1400   MONOABS 0.5 01/31/2015 1400   EOSABS 0.0 01/31/2015 1400   BASOSABS 0.0 01/31/2015 1400   Chronic anemia     Assessment & Plan:   Discussion: 82 year old male never smoker with HTN, depression, anxiety, CKD III, atrial fibrillation, secondary hypercoagulable state, GERD, HLD who present for follow. CT reviewed with resolution of GGO and RML nodule,  interval pulmonary edema. PFTs reviewed with restrictive defect. Clincally patient with exam findings and chest imaging consistent with volume overload   Abnormal CT - resolved --No further imaging indicated --No bronchoscopy indicated  Shortness of breath secondary pulmonary edema Moderately severe restrictive defect 2/2 chest wall compliance +/- volume overload/pulmonary edema Hx of anemia. No recent bleeding --ORDER albuterol AS NEEDED for shortness of breath or wheezing --START lasix 40 mg for three days. STOP HCTZ --Resume HCTZ after completing lasix --Contact cardiologist for sooner appointment if able --ORDER labs: CBC and BMET --Echocardiogram ordered  Health Maintenance Immunization History  Administered Date(s) Administered   DTaP 02/12/2007   PFIZER(Purple Top)SARS-COV-2 Vaccination 01/06/2020, 01/27/2020, 01/27/2020, 09/24/2020   Pneumococcal Conjugate-13 02/12/2007   CT Lung Screen - not qualified  Orders Placed This Encounter  Procedures   CBC   Basic Metabolic Panel (BMET)   ECHOCARDIOGRAM COMPLETE    Standing Status:   Future    Standing Expiration Date:   02/03/2024    Order Specific Question:   Where should this test be performed    Answer:   [facility]    Order Specific Question:   Perflutren DEFINITY (image enhancing agent) should be administered unless hypersensitivity or allergy exist    Answer:    Administer Perflutren    Order Specific Question:   Is a special reader required? (athlete or structural heart)    Answer:   No    Order Specific Question:   Does this study need to be read by the Structural team/Level 3 readers?    Answer:   Yes - Echo_Structural_Heart    Order Specific Question:   Reason for exam-Echo    Answer:   Other-Full Diagnosis List    Order Specific Question:   Full ICD-10/Reason for Exam    Answer:   Pulmonary edema [338250]    Order Specific Question:   Release to patient    Answer:   Immediate   Meds ordered this encounter  Medications   albuterol (VENTOLIN HFA) 108 (90 Base) MCG/ACT inhaler    Sig: Inhale 2 puffs into the lungs every 6 (six) hours as needed for wheezing or shortness of breath.    Dispense:  8 g    Refill:  3   furosemide (LASIX) 20 MG tablet    Sig: Take 2 tablets for three days. While taking, STOP taking hydrochlorothiazide (HCTZ). OK to restart after completing lasix.    Dispense:  30 tablet    Refill:  0    Return in about 2 weeks (around 02/16/2023).  I have spent a total time of 35-minutes on the day of the appointment including chart review, data review, collecting history, coordinating care and discussing medical diagnosis and plan with the patient/family. Past medical history, allergies, medications were reviewed. Pertinent imaging, labs and tests included in this note have been reviewed and interpreted independently by me.  [place], MD [facility] 02/02/2023 12:58 PM  Office Number [phone]

## 2023-02-02 NOTE — Progress Notes (Signed)
Full PFT Performed Today. 

## 2023-02-02 NOTE — Patient Instructions (Signed)
Full PFT Performed Today. 

## 2023-02-02 NOTE — Telephone Encounter (Signed)
Called patient to discuss scheduling the Echocardiogram ordered by Dr. Sampson Goon answer and no voice mail

## 2023-02-03 LAB — BASIC METABOLIC PANEL
BUN/Creatinine Ratio: 14 (ref 10–24)
BUN: 18 mg/dL (ref 8–27)
CO2: 22 mmol/L (ref 20–29)
Calcium: 9.4 mg/dL (ref 8.6–10.2)
Chloride: 98 mmol/L (ref 96–106)
Creatinine, Ser: 1.31 mg/dL — ABNORMAL HIGH (ref 0.76–1.27)
Glucose: 108 mg/dL — ABNORMAL HIGH (ref 70–99)
Potassium: 4.3 mmol/L (ref 3.5–5.2)
Sodium: 137 mmol/L (ref 134–144)
eGFR: 55 mL/min/{1.73_m2} — ABNORMAL LOW (ref >59)

## 2023-02-03 LAB — CBC
Hematocrit: 39.3 % (ref 37.5–51.0)
Hemoglobin: 11.7 g/dL — ABNORMAL LOW (ref 13.0–17.7)
MCH: 25.9 pg — ABNORMAL LOW (ref 26.6–33.0)
MCHC: 29.8 g/dL — ABNORMAL LOW (ref 31.5–35.7)
MCV: 87 fL (ref 79–97)
Platelets: 319 10*3/uL (ref 150–450)
RBC: 4.51 x10E6/uL (ref 4.14–5.80)
RDW: 20.5 % — ABNORMAL HIGH (ref 11.6–15.4)
WBC: 8.7 10*3/uL (ref 3.4–10.8)

## 2023-02-03 NOTE — Telephone Encounter (Signed)
Left message for patient to call and discuss scheduling the Echocardiogram ordered by Dr. Loanne Drilling

## 2023-02-15 ENCOUNTER — Ambulatory Visit (HOSPITAL_BASED_OUTPATIENT_CLINIC_OR_DEPARTMENT_OTHER): Payer: Medicare PPO | Admitting: Pulmonary Disease

## 2023-02-24 ENCOUNTER — Ambulatory Visit (INDEPENDENT_AMBULATORY_CARE_PROVIDER_SITE_OTHER): Payer: Medicare PPO

## 2023-02-24 DIAGNOSIS — I501 Left ventricular failure: Secondary | ICD-10-CM

## 2023-02-24 LAB — ECHOCARDIOGRAM COMPLETE
Area-P 1/2: 4.89 cm2
MV M vel: 1.72 m/s
MV Peak grad: 11.8 mmHg
S' Lateral: 2.78 cm

## 2023-02-26 ENCOUNTER — Ambulatory Visit (INDEPENDENT_AMBULATORY_CARE_PROVIDER_SITE_OTHER): Payer: Medicare PPO

## 2023-02-26 ENCOUNTER — Ambulatory Visit (HOSPITAL_BASED_OUTPATIENT_CLINIC_OR_DEPARTMENT_OTHER): Payer: Medicare PPO | Admitting: Pulmonary Disease

## 2023-02-26 ENCOUNTER — Encounter (HOSPITAL_BASED_OUTPATIENT_CLINIC_OR_DEPARTMENT_OTHER): Payer: Self-pay | Admitting: Pulmonary Disease

## 2023-02-26 VITALS — BP 110/60 | HR 76 | Ht 69.0 in | Wt 368.8 lb

## 2023-02-26 DIAGNOSIS — R3589 Other polyuria: Secondary | ICD-10-CM | POA: Diagnosis not present

## 2023-02-26 DIAGNOSIS — R600 Localized edema: Secondary | ICD-10-CM | POA: Diagnosis not present

## 2023-02-26 DIAGNOSIS — R0602 Shortness of breath: Secondary | ICD-10-CM | POA: Insufficient documentation

## 2023-02-26 DIAGNOSIS — I272 Pulmonary hypertension, unspecified: Secondary | ICD-10-CM | POA: Diagnosis not present

## 2023-02-26 LAB — BASIC METABOLIC PANEL
BUN/Creatinine Ratio: 14 (ref 10–24)
BUN: 19 mg/dL (ref 8–27)
CO2: 19 mmol/L — ABNORMAL LOW (ref 20–29)
Calcium: 9.3 mg/dL (ref 8.6–10.2)
Chloride: 102 mmol/L (ref 96–106)
Creatinine, Ser: 1.35 mg/dL — ABNORMAL HIGH (ref 0.76–1.27)
Glucose: 110 mg/dL — ABNORMAL HIGH (ref 70–99)
Potassium: 4.4 mmol/L (ref 3.5–5.2)
Sodium: 140 mmol/L (ref 134–144)
eGFR: 53 mL/min/{1.73_m2} — ABNORMAL LOW (ref >59)

## 2023-02-26 NOTE — Progress Notes (Signed)
Subjective:   PATIENT ID: Joseph [name] GENDER: male DOB: Apr 23, 1941, MRN: [id number]  Chief Complaint  Patient presents with   Follow-up    Inhaler is working     Reason for Visit: Follow-up shortness of breath  [name] is a 82 year old male never smoker with HTN, depression, anxiety, CKD III, atrial fibrillation, secondary hypercoagulable state, GERD, HLD who presents for follow-up. Presents with wife  Initial consult Referred by his PCP for incidental pulmonary findings during surveillance of his thoracic aneurysm. He reports shortness of breath with activity. Worsened in the last four months. May have had atypical viral illness but not sure. Denies associated cough, wheezing, chest pain. Before this time point he felt like he was more ambulatory on his walker but currently has had to slow down. Activity limited by back pain and atrial fib.  02/02/23 Since our last visit he remains short of breath with exertion. Denies wheezing or cough. Has had worsening lower extremity swelling. Has raised his legs up for some relief.   02/26/23 Since our last visit his albuterol helps his breathing. Takes it 3 times a day with relief. His shortness of breath has improved after lasix but does feel like he needs additional lasix for 15 out of 30 days this month. Increased lower extremity swelling today after returning to his HCTZ. Activity limited by his back pain. Considering evaluation for surgery in May 8th.  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  Past Medical History:  Diagnosis Date   Allergy    Atrial fibrillation ([place]) 11/2008   Atrial fibrillation ([employer])    Atypical atrial flutter ([place]) 07/13/2014   Diagnosis on EKG 07/13/14 : Cardioversion August 2015   Colon polyp    Depression    Diverticulosis    Dysrhythmia    GERD (gastroesophageal reflux disease)    Headache(784.0)    Hyperlipidemia    Hypertension     Panic attacks    PONV (postoperative nausea and vomiting)    Thoracic aneurysm      Family History  Problem Relation Age of Onset   Heart disease Mother 69       mother   Heart attack Mother    Lung cancer Father    Heart disease Father    Kidney disease Brother 75       died age 39 heart attack   Heart attack Brother    Heart disease Maternal Grandfather    Colon cancer Neg Hx      Social History   Occupational History   Occupation: Retired  Tobacco Use   Smoking status: Never   Smokeless tobacco: Never   Tobacco comments:    Never smoke 11/23/22  Vaping Use   Vaping Use: Never used  Substance and Sexual Activity   Alcohol use: No   Drug use: No   Sexual activity: Not on file    Allergies  Allergen Reactions   Eszopiclone     Other Reaction(s): not effective     Outpatient Medications Prior to Visit  Medication Sig Dispense Refill   acetaminophen (TYLENOL) 500 MG tablet Take 500 mg by mouth every 6 (six) hours as needed (pain).     albuterol (VENTOLIN HFA) 108 (90 Base) MCG/ACT inhaler Inhale 2 puffs into the lungs every 6 (six) hours as needed for wheezing or shortness of breath. 8 g 3   ALPRAZolam (XANAX) 0.5 MG tablet Take 0.25-0.5 mg by mouth  3 (three) times daily as needed (ear ringing.).     amiodarone (PACERONE) 200 MG tablet Take 1 tablet (200 mg total) by mouth daily. 90 tablet 3   Azelaic Acid (FINACEA) 15 % gel 1 application Externally Twice a day     Cholecalciferol (VITAMIN D3) 25 MCG (1000 UT) CAPS 1 capsule Orally Once a day     CONTRAVE 8-90 MG TB12 Take 2 tablets by mouth 2 (two) times daily.     cyanocobalamin (VITAMIN B12) 1000 MCG tablet 1 tablet Orally Once a day     Ferrous Sulfate (IRON) 325 (65 Fe) MG TABS 1 tablet Orally Three times a Week     furosemide (LASIX) 20 MG tablet Take 2 tablets for three days. While taking, STOP taking hydrochlorothiazide (HCTZ). OK to restart after completing lasix. 30 tablet 0   hydrochlorothiazide  (HYDRODIURIL) 25 MG tablet Take 25 mg by mouth in the morning.     metoprolol succinate (TOPROL-XL) 100 MG 24 hr tablet Take 100 mg by mouth in the morning. Take with or immediately following a meal.     Multiple Minerals-Vitamins (CAL-MAG-ZINC-D) TABS 1 tablet Orally once a day     Multiple Vitamins-Minerals (CENTRUM SILVER) tablet as directed Orally     olmesartan (BENICAR) 40 MG tablet Take 40 mg by mouth in the morning.     pantoprazole (PROTONIX) 40 MG tablet Take 40 mg by mouth in the morning.  0   potassium chloride SA (K-DUR,KLOR-CON) 20 MEQ tablet Take 20 mEq by mouth in the morning.     rosuvastatin (CRESTOR) 20 MG tablet TAKE 1 TABLET(20 MG) BY MOUTH DAILY 90 tablet 1   sertraline (ZOLOFT) 100 MG tablet Take 100 mg by mouth in the morning.  0   XARELTO 20 MG TABS tablet TAKE 1 TABLET(20 MG) BY MOUTH DAILY WITH SUPPER 90 tablet 1   No facility-administered medications prior to visit.    Review of Systems  Constitutional:  Negative for chills, diaphoresis, fever, malaise/fatigue and weight loss.  HENT:  Negative for congestion.   Respiratory:  Positive for shortness of breath. Negative for cough, hemoptysis, sputum production and wheezing.   Cardiovascular:  Negative for chest pain, palpitations and leg swelling.     Objective:   Vitals:   02/26/23 1032  BP: 110/60  Pulse: 76  SpO2: 93%  Weight: (!) 368 lb 13.3 oz (167.3 kg)  Height: '5\' 9"'$  (1.753 m)   SpO2: 93 % O2 Device: None (Room air)  Physical Exam: General: Well-appearing, no acute distress HENT: [place], AT Eyes: EOMI, no scleral icterus Respiratory: Clear to auscultation bilaterally.  No crackles, wheezing or rales Cardiovascular: RRR, -M/R/G, no JVD Extremities:2+ pitting edema in lower extremities,-tenderness Neuro: AAO x4, CNII-XII grossly intact Psych: Normal mood, normal affect   Data Reviewed:  Imaging: CTA 10/21/22 - Stable thoracic aneurysm. Bilateral upper lobe predominant ill-defined GGO,  nonspecific CT Chest 01/26/23 - Pulmonary edema. Increased mediastinal and hilar lymph nodes, suspect reactive in setting of edema. Mild diffuse interlobular septal thickening. Prior bilateral upper and RML GGO less prominent. Resolved RML nodule  PFT: 02/02/23 FVC 2.14 (55%) FEV1 1.57 (58%) Ratio 79  TLC 63% DLCO 75% Interpretation: Moderately severe restrictive defect with mild reduction in DLCO   Labs:    Latest Ref Rng & Units 02/02/2023   11:26 AM 10/12/2022    2:07 PM 09/22/2021    8:55 AM  CBC  WBC 3.4 - 10.8 x10E3/uL 8.7   6.6   Hemoglobin 13.0 -  17.7 g/dL 11.7  10.1  12.4   Hematocrit 37.5 - 51.0 % 39.3   38.9   Platelets 150 - 450 x10E3/uL 319   270        Latest Ref Rng & Units 02/02/2023   11:26 AM 10/12/2022    2:07 PM 09/22/2021    8:55 AM  BMP  Glucose 70 - 99 mg/dL 108  101  104   BUN 8 - 27 mg/dL '18  20  14   '$ Creatinine 0.76 - 1.27 mg/dL 1.31  1.37  1.23   BUN/Creat Ratio 10 - '24 14  15  11   '$ Sodium 134 - 144 mmol/L 137  134  137   Potassium 3.5 - 5.2 mmol/L 4.3  5.3  4.5   Chloride 96 - 106 mmol/L 98  97  99   CO2 20 - 29 mmol/L '22  22  23   '$ Calcium 8.6 - 10.2 mg/dL 9.4  9.2  8.9    Cardiac: Echocardiogram 02/24/23 - Normal EF. No WMA. RVSP 59.2 mm Hg. Normal RV function. Ascending aorta measuring 5 cm    Assessment & Plan:   Discussion: 82 year old male never smoker with HTN, depression, anxiety, CKD III, atrial fibrillation, secondary hypercoagulable state, GERD, HLD who present for follow. CT reviewed with resolution of GGO and RML nodule, interval pulmonary edema. PFTs reviewed with restrictive defect. Clincally patient with exam findings and chest imaging consistent with volume overload  Shortness of breath Lower extremity edema Moderately severe restrictive defect 2/2 chest wall compliance +/- volume overload --CONTINUE albuterol AS NEEDED for shortness of breath or wheezing --START lasix 40 mg for three days.STOP HCTZ --Resume HCTZ after completing  lasix --Monitor weight. Take lasix if 3lb weight gain in a day or 5lb weight gain in a week --ORDER CXR. Will contact you with results --ORDER labs: BMET  Probable pulmonary hypertension --Reviewed echocardiogram. Offered right heart cath. Patient will discuss with Cardiology --Keep Cardiology appointment --ORDER overnight oximetry  Ascending aortic aneurysm 5 cm --Send message to Cardiology  Back pain --Planning for surgery. Will need to optimize cardiac and pulmonary status before this --Consider sleep study prior to this   Health Maintenance Immunization History  Administered Date(s) Administered   DTaP 02/12/2007   Influenza, High Dose Seasonal PF 09/19/2017, 08/17/2019   PFIZER(Purple Top)SARS-COV-2 Vaccination 01/06/2020, 01/27/2020, 01/27/2020, 09/24/2020   Pneumococcal Conjugate-13 02/12/2007   CT Lung Screen - not qualified  Orders Placed This Encounter  Procedures   DG Chest 2 View    Standing Status:   Future    Number of Occurrences:   1    Standing Expiration Date:   02/26/2024    Order Specific Question:   Reason for Exam (SYMPTOM  OR DIAGNOSIS REQUIRED)    Answer:   sob    Order Specific Question:   Preferred imaging location?    Answer:   [facility]   Basic Metabolic Panel (BMET)   Pulse oximetry, overnight    Standing Status:   Future    Standing Expiration Date:   02/26/2024   No orders of the defined types were placed in this encounter.   Return in about 3 months (around 05/29/2023).  I have spent a total time of 35-minutes on the day of the appointment including chart review, data review, collecting history, coordinating care and discussing medical diagnosis and plan with the patient/family. Past medical history, allergies, medications were reviewed. Pertinent imaging, labs and tests included in this note have  been reviewed and interpreted independently by me.  [place], MD [facility] 02/26/2023 1:30 PM  Office  Number [phone]

## 2023-02-26 NOTE — Patient Instructions (Addendum)
  Shortness of breath --CONTINUE albuterol AS NEEDED for shortness of breath or wheezing --START lasix 40 mg for three days.STOP HCTZ --Resume HCTZ after completing lasix --Monitor weight. Take lasix if 3lb weight gain in a day or 5lb weight gain in a week --ORDER CXR. Will contact you with results  Probable pulmonary hypertension --ORDER overnight oximetry  Back pain --Planning for surgery. Will need to optimize cardiac and pulmonary status before this --Consider sleep study prior to this  Please call our office once your night oxygen test is completed so we can request results  Follow-up with me in 3 months (end of May)

## 2023-03-02 NOTE — Addendum Note (Signed)
Addended by: Margaretha Seeds on: 03/02/2023 11:30 AM   Modules accepted: Orders

## 2023-03-03 DIAGNOSIS — I8312 Varicose veins of left lower extremity with inflammation: Secondary | ICD-10-CM | POA: Diagnosis not present

## 2023-03-03 DIAGNOSIS — L570 Actinic keratosis: Secondary | ICD-10-CM | POA: Diagnosis not present

## 2023-03-03 DIAGNOSIS — I8311 Varicose veins of right lower extremity with inflammation: Secondary | ICD-10-CM | POA: Diagnosis not present

## 2023-03-03 DIAGNOSIS — L718 Other rosacea: Secondary | ICD-10-CM | POA: Diagnosis not present

## 2023-03-03 DIAGNOSIS — L814 Other melanin hyperpigmentation: Secondary | ICD-10-CM | POA: Diagnosis not present

## 2023-03-03 DIAGNOSIS — L821 Other seborrheic keratosis: Secondary | ICD-10-CM | POA: Diagnosis not present

## 2023-03-03 DIAGNOSIS — I872 Venous insufficiency (chronic) (peripheral): Secondary | ICD-10-CM | POA: Diagnosis not present

## 2023-03-03 DIAGNOSIS — Z85828 Personal history of other malignant neoplasm of skin: Secondary | ICD-10-CM | POA: Diagnosis not present

## 2023-03-03 DIAGNOSIS — R0602 Shortness of breath: Secondary | ICD-10-CM | POA: Diagnosis not present

## 2023-03-03 DIAGNOSIS — L309 Dermatitis, unspecified: Secondary | ICD-10-CM | POA: Diagnosis not present

## 2023-03-03 DIAGNOSIS — D044 Carcinoma in situ of skin of scalp and neck: Secondary | ICD-10-CM | POA: Diagnosis not present

## 2023-03-03 DIAGNOSIS — C44519 Basal cell carcinoma of skin of other part of trunk: Secondary | ICD-10-CM | POA: Diagnosis not present

## 2023-03-03 DIAGNOSIS — Z6841 Body Mass Index (BMI) 40.0 and over, adult: Secondary | ICD-10-CM | POA: Diagnosis not present

## 2023-03-03 DIAGNOSIS — I480 Paroxysmal atrial fibrillation: Secondary | ICD-10-CM | POA: Diagnosis not present

## 2023-03-03 DIAGNOSIS — R600 Localized edema: Secondary | ICD-10-CM | POA: Diagnosis not present

## 2023-03-09 DIAGNOSIS — H2511 Age-related nuclear cataract, right eye: Secondary | ICD-10-CM | POA: Diagnosis not present

## 2023-03-09 DIAGNOSIS — H18413 Arcus senilis, bilateral: Secondary | ICD-10-CM | POA: Diagnosis not present

## 2023-03-09 DIAGNOSIS — H2513 Age-related nuclear cataract, bilateral: Secondary | ICD-10-CM | POA: Diagnosis not present

## 2023-03-09 DIAGNOSIS — H25013 Cortical age-related cataract, bilateral: Secondary | ICD-10-CM | POA: Diagnosis not present

## 2023-03-09 DIAGNOSIS — H25043 Posterior subcapsular polar age-related cataract, bilateral: Secondary | ICD-10-CM | POA: Diagnosis not present

## 2023-03-10 DIAGNOSIS — G4730 Sleep apnea, unspecified: Secondary | ICD-10-CM | POA: Diagnosis not present

## 2023-03-10 DIAGNOSIS — R0683 Snoring: Secondary | ICD-10-CM | POA: Diagnosis not present

## 2023-03-17 ENCOUNTER — Telehealth (HOSPITAL_BASED_OUTPATIENT_CLINIC_OR_DEPARTMENT_OTHER): Payer: Self-pay | Admitting: Pulmonary Disease

## 2023-03-17 DIAGNOSIS — G4734 Idiopathic sleep related nonobstructive alveolar hypoventilation: Secondary | ICD-10-CM

## 2023-03-17 NOTE — Telephone Encounter (Signed)
Order placed. Nothing further needed. 

## 2023-03-17 NOTE — Telephone Encounter (Signed)
[facility] Telephone Encounter  ONO 03/10/23 SpO2 < 88% 4hr 44 min 47 sec. Nadir SpO2 78%. Baseline SpO2 89% Interpretation: Qualifies for oxygen Recommend 2L oxygen nightly  Staff please order 2L O2 via nasal cannula nightly

## 2023-03-18 ENCOUNTER — Other Ambulatory Visit: Payer: Self-pay | Admitting: Cardiology

## 2023-03-18 ENCOUNTER — Encounter: Payer: Self-pay | Admitting: Cardiology

## 2023-03-18 ENCOUNTER — Ambulatory Visit: Payer: Medicare PPO | Admitting: Cardiology

## 2023-03-18 VITALS — BP 144/92 | HR 74 | Resp 16 | Ht 69.0 in | Wt 362.0 lb

## 2023-03-18 DIAGNOSIS — N1831 Chronic kidney disease, stage 3a: Secondary | ICD-10-CM

## 2023-03-18 DIAGNOSIS — Z79899 Other long term (current) drug therapy: Secondary | ICD-10-CM

## 2023-03-18 DIAGNOSIS — I2729 Other secondary pulmonary hypertension: Secondary | ICD-10-CM | POA: Diagnosis not present

## 2023-03-18 DIAGNOSIS — I480 Paroxysmal atrial fibrillation: Secondary | ICD-10-CM

## 2023-03-18 DIAGNOSIS — I4819 Other persistent atrial fibrillation: Secondary | ICD-10-CM | POA: Diagnosis not present

## 2023-03-18 DIAGNOSIS — I5033 Acute on chronic diastolic (congestive) heart failure: Secondary | ICD-10-CM

## 2023-03-18 DIAGNOSIS — I129 Hypertensive chronic kidney disease with stage 1 through stage 4 chronic kidney disease, or unspecified chronic kidney disease: Secondary | ICD-10-CM | POA: Diagnosis not present

## 2023-03-18 MED ORDER — DAPAGLIFLOZIN PROPANEDIOL 10 MG PO TABS: 10.0000 mg | ORAL_TABLET | Freq: Every day | ORAL | 2 refills | Status: DC

## 2023-03-18 MED ORDER — TADALAFIL (PAH) 20 MG PO TABS: 20.0000 mg | ORAL_TABLET | Freq: Every day | ORAL | 1 refills | Status: DC

## 2023-03-18 NOTE — Progress Notes (Signed)
Primary Physician/Referring:  Carol Ada, MD  Patient ID: Joseph [name], male    DOB: Jul 17, 1941, 82 y.o.   MRN: [id number]  Chief Complaint  Patient presents with   Atrial Fibrillation   New Patient (Initial Visit)   HPI:    Joseph [name]  is a 82 y.o. Caucasian male patient with paroxysmal atrial fibrillation SP atrial fibrillation ablation on 2013, has had cardioversion in 2015, presently on chronic amiodarone, ascending thoracic aortic aneurysm being followed by Dr. Thayer Headings stable at around 5 cm since 2017 or longer, primary hypertension, hypercholesterolemia, morbid obesity, negative sleep study in the past referred to me for evaluation of worsening leg edema and dyspnea on exertion.  Patient has been chronically ill with regard to his dyspnea, leg edema and atrial fibrillation issues.  He is markedly sedentary.  His wife is present.  Except for generalized weakness, marked dyspnea, leg edema, no specific symptoms per se.  In spite of his medical issues, elderly age, continues to be jovial.  His wife is present.  Past Medical History:  Diagnosis Date   Allergy    Atrial fibrillation ([place]) 11/2008   Atrial fibrillation ([place])    Atypical atrial flutter ([place]) 07/13/2014   Diagnosis on EKG 07/13/14 : Cardioversion August 2015   Colon polyp    Depression    Diverticulosis    Dysrhythmia    GERD (gastroesophageal reflux disease)    Headache(784.0)    Hyperlipidemia    Hypertension    Panic attacks    PONV (postoperative nausea and vomiting)    Thoracic aneurysm    Past Surgical History:  Procedure Laterality Date   ablataion  07/06/2012   APPENDECTOMY  1958   BIOPSY  12/03/2022   Procedure: BIOPSY;  Surgeon: Ladene Artist, MD;  Location: [facility];  Service: Gastroenterology;;   CARDIAC CATHETERIZATION  2009   CARDIOVERSION  03/18/2012   Procedure: CARDIOVERSION;  Surgeon: [name], MD;  Location: [facility];  Service: Cardiovascular;  Laterality: N/A;    CARDIOVERSION N/A 08/23/2014   Procedure: CARDIOVERSION;  Surgeon: [name], MD;  Location: [facility];  Service: Cardiovascular;  Laterality: N/A;   COLONOSCOPY WITH PROPOFOL N/A 12/03/2022   Procedure: COLONOSCOPY WITH PROPOFOL;  Surgeon: Ladene Artist, MD;  Location: [facility];  Service: Gastroenterology;  Laterality: N/A;   ESOPHAGOGASTRODUODENOSCOPY (EGD) WITH PROPOFOL N/A 12/03/2022   Procedure: ESOPHAGOGASTRODUODENOSCOPY (EGD) WITH PROPOFOL;  Surgeon: Ladene Artist, MD;  Location: [facility];  Service: Gastroenterology;  Laterality: N/A;   INGUINAL HERNIA REPAIR     right   LUMBAR LAMINECTOMY/DECOMPRESSION MICRODISCECTOMY Bilateral 02/08/2015   Procedure: Laminectomy and Foraminotomy - bilateral - Lumbar two-three, three-four, left lumbar four-five with resection of synovial cyst ;  Surgeon: Charlie Pitter, MD;  Location: [facility];  Service: Neurosurgery;  Laterality: Bilateral;   POLYPECTOMY  12/03/2022   Procedure: POLYPECTOMY;  Surgeon: Ladene Artist, MD;  Location: [facility];  Service: Gastroenterology;;   spinal tap     VASECTOMY     Family History  Problem Relation Age of Onset   Heart disease Mother 66       mother   Heart attack Mother    Lung cancer Father    Heart disease Father    Kidney disease Brother 16       died age 92 heart attack   Heart attack Brother    Heart disease Maternal Grandfather    Colon cancer Neg Hx  Social History   Tobacco Use   Smoking status: Never   Smokeless tobacco: Never   Tobacco comments:    Never smoke 11/23/22  Substance Use Topics   Alcohol use: No   Marital Status: Married  ROS  Review of Systems  Constitutional: Positive for malaise/fatigue.  Cardiovascular:  Positive for dyspnea on exertion and leg swelling. Negative for chest pain.   Objective      03/18/2023   11:28 AM 02/26/2023   10:32 AM 02/02/2023   10:30 AM  Vitals with BMI  Height 5\' 9"  5\' 9"  5\' 9"   Weight 362 lbs 368 lbs 13 oz  371 lbs  BMI 53.43 [id number]  Systolic 123456 [id number]  Diastolic 92 60 70  Pulse 74 76 67   SpO2: 94 %  Physical Exam Constitutional:      Appearance: He is morbidly obese.  Neck:     Vascular: No carotid bruit or JVD (Short neck).  Cardiovascular:     Rate and Rhythm: Normal rate. Rhythm irregular.     Pulses:          Dorsalis pedis pulses are 0 on the right side and 0 on the left side.       Posterior tibial pulses are 0 on the right side and 0 on the left side.     Heart sounds: No murmur heard.    Comments: Difficult to palpate his popliteal and femoral arteries due to body habitus Pulmonary:     Effort: Pulmonary effort is normal.     Breath sounds: Normal breath sounds.  Abdominal:     General: Abdomen is protuberant. Bowel sounds are normal.     Palpations: Abdomen is soft.  Musculoskeletal:     Right lower leg: Edema (2+ pitting below knee) present.     Left lower leg: Edema (2+ pitting below knee) present.  Skin:    Capillary Refill: Capillary refill takes less than 2 seconds.    Medications and allergies   Allergies  Allergen Reactions   Eszopiclone     Other Reaction(s): not effective     Medication list   Current Outpatient Medications:    acetaminophen (TYLENOL) 500 MG tablet, Take 500 mg by mouth every 6 (six) hours as needed (pain)., Disp: , Rfl:    albuterol (VENTOLIN HFA) 108 (90 Base) MCG/ACT inhaler, Inhale 2 puffs into the lungs every 6 (six) hours as needed for wheezing or shortness of breath., Disp: 8 g, Rfl: 3   ALPRAZolam (XANAX) 0.5 MG tablet, Take 0.25-0.5 mg by mouth 3 (three) times daily as needed (ear ringing.)., Disp: , Rfl:    amiodarone (PACERONE) 200 MG tablet, Take 1 tablet (200 mg total) by mouth daily., Disp: 90 tablet, Rfl: 3   Azelaic Acid (FINACEA) 15 % gel, 1 application Externally Twice a day, Disp: , Rfl:    Cholecalciferol (VITAMIN D3) 25 MCG (1000 UT) CAPS, 1 capsule Orally Once a day, Disp: , Rfl:    cyanocobalamin  (VITAMIN B12) 1000 MCG tablet, 1 tablet Orally Once a day, Disp: , Rfl:    dapagliflozin propanediol (FARXIGA) 10 MG TABS tablet, Take 1 tablet (10 mg total) by mouth daily before breakfast., Disp: 30 tablet, Rfl: 2   Ferrous Sulfate (IRON) 325 (65 Fe) MG TABS, 1 tablet Orally Three times a Week, Disp: , Rfl:    furosemide (LASIX) 20 MG tablet, Take 2 tablets for three days. While taking, STOP taking hydrochlorothiazide (HCTZ). OK to restart after completing lasix.,  Disp: 30 tablet, Rfl: 0   metoprolol succinate (TOPROL-XL) 100 MG 24 hr tablet, Take 100 mg by mouth in the morning. Take with or immediately following a meal., Disp: , Rfl:    Multiple Minerals-Vitamins (CAL-MAG-ZINC-D) TABS, 1 tablet Orally once a day, Disp: , Rfl:    Multiple Vitamins-Minerals (CENTRUM SILVER) tablet, as directed Orally, Disp: , Rfl:    olmesartan (BENICAR) 40 MG tablet, Take 40 mg by mouth in the morning., Disp: , Rfl:    pantoprazole (PROTONIX) 40 MG tablet, Take 40 mg by mouth in the morning., Disp: , Rfl: 0   potassium chloride SA (K-DUR,KLOR-CON) 20 MEQ tablet, Take 20 mEq by mouth in the morning., Disp: , Rfl:    rosuvastatin (CRESTOR) 20 MG tablet, TAKE 1 TABLET(20 MG) BY MOUTH DAILY, Disp: 90 tablet, Rfl: 1   sertraline (ZOLOFT) 100 MG tablet, Take 100 mg by mouth in the morning., Disp: , Rfl: 0   tadalafil, PAH, (ADCIRCA) 20 MG tablet, Take 1 tablet (20 mg total) by mouth daily., Disp: 30 tablet, Rfl: 1   tamsulosin (FLOMAX) 0.4 MG CAPS capsule, Take 0.4 mg by mouth daily after breakfast., Disp: , Rfl:    triamcinolone cream (KENALOG) 0.1 %, Apply 1 Application topically as needed., Disp: , Rfl:    XARELTO 20 MG TABS tablet, TAKE 1 TABLET(20 MG) BY MOUTH DAILY WITH SUPPER, Disp: 90 tablet, Rfl: 1 Laboratory examination:   Recent Labs    10/12/22 1407 02/02/23 1126 02/26/23 1137  NA 134 137 140  K 5.3* 4.3 4.4  CL 97 98 102  CO2 22 22 19*  GLUCOSE 101* 108* 110*  BUN 20 18 19   CREATININE 1.37*  1.31* 1.35*  CALCIUM 9.2 9.4 9.3   Lab Results  Component Value Date   GLUCOSE 110 (H) 02/26/2023   NA 140 02/26/2023   K 4.4 02/26/2023   CL 102 02/26/2023   CO2 19 (L) 02/26/2023   BUN 19 02/26/2023   CREATININE 1.35 (H) 02/26/2023   EGFR 53 (L) 02/26/2023   CALCIUM 9.3 02/26/2023   PROT 6.9 11/01/2019   ALBUMIN 4.7 11/01/2019   BILITOT 0.4 11/01/2019   ALKPHOS 66 11/01/2019   AST 21 11/01/2019   ALT 20 11/01/2019   ANIONGAP 6 01/31/2015      TSH Recent Labs    10/12/22 1407  TSH 2.890    External labs:   Labs 03/03/2023:  Serum glucose 97 mg, BUN 19, creatinine 1.38, EGFR 50 to mL, potassium 4.2.  LFTs normal.  Iron studies reveal mild decrease in iron.  Labs 12/29/2022:  Hb 10.1/HCT 32.9, platelets 346, microcytic indicis.  Cholesterol, total 129.000 m 07/23/2022 HDL 62.000 mg 07/23/2022 LDL 51.000 mg 07/23/2022 Triglycerides 76.000 mg 07/23/2022  Radiology:   CT angiogram chest 01/26/2023: 1. The previous nodular ground-glass opacities have nearly resolved compatible with resolving infection. 2. Cardiomegaly. New pulmonary edema since 10/21/2022. Trace right pleural effusion. 3. Unchanged 49 mm ascending aortic aneurysm. Ascending thoracic aortic aneurysm. Recommend semi-annual imaging followup by CTA or MRA and referral to cardiothoracic surgery if not already obtained. Aortic aneurysm stable since 2017.  Chest x-ray two-view 02/28/2023: Cardiac silhouette is prominent. There is pulmonary interstitial prominence with vascular congestion. No focal consolidation. No pneumothorax or pleural effusion identified. There are thoracic degenerative changes. IMPRESSION: Findings suggest CHF.  PFTs 02/02/2023: Moderately severe restrictive defect with mild reduction in DLCO  Cardiac Studies:   Left Heart Catheterization 12/13/2008:  1. Hemodynamic data:      a.  Aortic pressure 126/76.      b.     Left ventricular pressure 128/40 mmHg.  2. Left  ventriculography:  LV cavity size and function are normal.  EF is 60%.  No obvious mitral regurgitation is noted.  3. Coronary angiography.      a.     Left main coronary artery:  Normal.      b.     Left anterior descending coronary artery:  Normal.  The vessel gives origin to 2 diagonal branches also normal.  The LAD is transapical.      c.     Circumflex artery:  The circumflex coronary artery gives origin to 3 marginals, they are normal.      d.     Right coronary artery:  The right coronary artery is normal. The PDA is large and normal.  Echocardiogram 02/24/2023: 1. Left ventricular ejection fraction, by estimation, is 60 to 65%. The left ventricle has normal function. The left ventricle has no regional wall motion abnormalities. There is mild left ventricular hypertrophy. Left ventricular diastolic parameters  were normal.  2. Right ventricular systolic function is normal. The right ventricular size is normal. There is moderately elevated pulmonary artery systolic pressure. The estimated right ventricular systolic pressure is [id number] mmHg.  3. Left atrial size was mildly dilated.  4. Right atrial size was mildly dilated.  5. The mitral valve is normal in structure. No evidence of mitral valve regurgitation. No evidence of mitral stenosis.  6. The aortic valve is normal in structure. Aortic valve regurgitation is not visualized. No aortic stenosis is present.  7. There is severe dilatation of the ascending aorta, measuring 50 mm.  8. The inferior vena cava is dilated in size with <50% respiratory variability, suggesting right atrial pressure of 15 mmHg.   EKG:   EKG 03/18/2023: Atrial fibrillation with controlled ventricular response at rate of 74 bpm, left axis deviation, left anterior fascicular block.  Incomplete April branch block.  Normal QT interval. Compared to 11/23/2022, sinus rhythm with first-degree AV block has been replaced.  Assessment     ICD-10-CM   1. Paroxysmal atrial  fibrillation (HCC)  I48.0 EKG 12-Lead    2. Acute on chronic diastolic heart failure (HCC)  I50.33 dapagliflozin propanediol (FARXIGA) 10 MG TABS tablet    Pro b natriuretic peptide (BNP)    Basic metabolic panel    tadalafil, PAH, (ADCIRCA) 20 MG tablet    3. Other secondary pulmonary hypertension (HCC)  I27.29 tadalafil, PAH, (ADCIRCA) 20 MG tablet    4. High risk medication use  Z79.899     5. Stage 3a chronic kidney disease (HCC)  N18.31        Orders Placed This Encounter  Procedures   Pro b natriuretic peptide (BNP)    Standing Status:   Standing    Number of Occurrences:   3    Standing Expiration Date:   123456   Basic metabolic panel    Standing Status:   Standing    Number of Occurrences:   3    Standing Expiration Date:   03/17/2024   EKG 12-Lead    Meds ordered this encounter  Medications   dapagliflozin propanediol (FARXIGA) 10 MG TABS tablet    Sig: Take 1 tablet (10 mg total) by mouth daily before breakfast.    Dispense:  30 tablet    Refill:  2   tadalafil, PAH, (ADCIRCA) 20 MG tablet    Sig: Take 1 tablet (20  mg total) by mouth daily.    Dispense:  30 tablet    Refill:  1    Medications Discontinued During This Encounter  Medication Reason   hydrochlorothiazide (HYDRODIURIL) 25 MG tablet Change in therapy   CONTRAVE 8-90 MG TB12 Ineffective     Recommendations:   Joseph [name] is a 82 y.o. Caucasian male patient with paroxysmal atrial fibrillation SP atrial fibrillation ablation on 2013, ascending thoracic aortic aneurysm being followed by Dr. Thayer Headings stable at around 5 cm since 2017 or longer, primary hypertension, hypercholesterolemia, morbid obesity, negative sleep study in the past referred to me for evaluation of worsening leg edema and dyspnea on exertion.  1. Paroxysmal atrial fibrillation [facility]) Patient is back in atrial fibrillation.  He is asymptomatic with regard to feeling of palpitations that used to feel previously.  Patient was  in sinus rhythm 3 months ago.  PAF could be precipitated by acute decompensated heart failure or vice versa.  However he is rate controlled.  2. High risk medication use Patient is presently on amiodarone for greater than a decade.  I reviewed his recently performed PFT, there is mild decrease in DLCO and also imaging studies continue to show persistent infiltrates.  I am concerned that he may be developing pulmonary toxicity.  I have discussed with his pulmonologist to see whether she would agree with my assessment and I may have to discontinue amiodarone after discussions.  He is also on anticoagulant, continue the same for now in view of high CHA2DS2-VASc risk score.  3. Acute on chronic diastolic heart failure ([place]) Patient is in acute decompensated heart failure with elevated JVD, probably ascites but limited exam due to morbid obesity, and 2-3+ bilateral leg edema.  I have started him on Farxiga 10 mg daily.  Extensive discussion with the patient and his wife, presently they eat at least 3-4 times a week outside and this has to completely change to home food and complete salt restriction discussed.  I will loosen up the salt restriction once he is more compensated.  Weekly labs for NT proBNP and BMP ordered as patient also has stage IIIa chronic kidney disease.  For now she will also continue using furosemide 40 mg daily but suspect with aggressive diet modification and Farxiga we may be able to decrease the dose or change to as needed use.  4. Other secondary pulmonary hypertension ([place]) Patient has secondary pulmonary hypertension related to diastolic heart failure and also restrictive lung disease from morbid obesity, WHO group 2.  I have started him on tadalafil 20 mg daily both to improve his systemic hypertension and also for reducing his RV failure although echocardiogram that was recently performed reveals preserved RV EF, does have moderate to moderately severe pulmonary  hypertension.  Add: I discussed with his pulmonologist Dr. Margaretha Seeds (Pul). His presentation is fairly complex and cannot completely exclude Amio toxicity unless after diuresis his infiltrates persist. I personally feel that long duration infiltrates is unlikely to be chronic pulmonary edema. Will discontinue Amio and I can always restart or choose another agent. I will call patient tomorrow morning and advise to discontinue this.   5. Stage 3a chronic kidney disease ([place]) In view of stage IIIa chronic kidney disease, he will need continuous and serial monitoring closely of his kidney function in view of the above changes.  Patient has a large thoracic aortic aneurysm measuring approximately 5 cm but fortunately has remained stable over the past >10 years.  Patient  also has chronic anemia and has iron deficiency, in view of acute decompensated heart failure, best option is parenteral iron supplementation.  We will look into this as we go along.  This was a 90-minute office visit encounter in review of his external records, external labs and coordination of care.    Adrian Prows, MD, [facility] 03/18/2023, 12:56 PM Office: [phone]

## 2023-03-19 ENCOUNTER — Encounter: Payer: Self-pay | Admitting: Cardiology

## 2023-03-19 LAB — BASIC METABOLIC PANEL
BUN/Creatinine Ratio: 16 (ref 10–24)
BUN: 21 mg/dL (ref 8–27)
CO2: 22 mmol/L (ref 20–29)
Calcium: 9.1 mg/dL (ref 8.6–10.2)
Chloride: 103 mmol/L (ref 96–106)
Creatinine, Ser: 1.34 mg/dL — ABNORMAL HIGH (ref 0.76–1.27)
Glucose: 96 mg/dL (ref 70–99)
Potassium: 4.2 mmol/L (ref 3.5–5.2)
Sodium: 139 mmol/L (ref 134–144)
eGFR: 53 mL/min/{1.73_m2} — ABNORMAL LOW (ref >59)

## 2023-03-19 LAB — PRO B NATRIURETIC PEPTIDE: NT-Pro BNP: 1150 pg/mL — ABNORMAL HIGH (ref 0–486)

## 2023-03-19 NOTE — Telephone Encounter (Signed)
From pt

## 2023-03-22 DIAGNOSIS — G4733 Obstructive sleep apnea (adult) (pediatric): Secondary | ICD-10-CM | POA: Diagnosis not present

## 2023-03-23 ENCOUNTER — Other Ambulatory Visit: Payer: Self-pay

## 2023-03-23 DIAGNOSIS — I5033 Acute on chronic diastolic (congestive) heart failure: Secondary | ICD-10-CM

## 2023-03-25 DIAGNOSIS — I5033 Acute on chronic diastolic (congestive) heart failure: Secondary | ICD-10-CM | POA: Diagnosis not present

## 2023-03-26 LAB — BASIC METABOLIC PANEL
BUN/Creatinine Ratio: 16 (ref 10–24)
BUN: 22 mg/dL (ref 8–27)
CO2: 18 mmol/L — ABNORMAL LOW (ref 20–29)
Calcium: 9.6 mg/dL (ref 8.6–10.2)
Chloride: 97 mmol/L (ref 96–106)
Creatinine, Ser: 1.39 mg/dL — ABNORMAL HIGH (ref 0.76–1.27)
Glucose: 101 mg/dL — ABNORMAL HIGH (ref 70–99)
Potassium: 4.5 mmol/L (ref 3.5–5.2)
Sodium: 134 mmol/L (ref 134–144)
eGFR: 51 mL/min/{1.73_m2} — ABNORMAL LOW (ref >59)

## 2023-03-27 LAB — PRO B NATRIURETIC PEPTIDE: NT-Pro BNP: 1124 pg/mL — ABNORMAL HIGH (ref 0–486)

## 2023-03-29 DIAGNOSIS — N401 Enlarged prostate with lower urinary tract symptoms: Secondary | ICD-10-CM | POA: Diagnosis not present

## 2023-03-29 DIAGNOSIS — R3914 Feeling of incomplete bladder emptying: Secondary | ICD-10-CM | POA: Diagnosis not present

## 2023-03-30 ENCOUNTER — Ambulatory Visit: Payer: Medicare PPO | Admitting: Cardiology

## 2023-03-30 ENCOUNTER — Encounter: Payer: Self-pay | Admitting: Cardiology

## 2023-03-30 VITALS — BP 120/58 | HR 68 | Resp 16 | Ht 69.0 in | Wt 352.0 lb

## 2023-03-30 DIAGNOSIS — I5033 Acute on chronic diastolic (congestive) heart failure: Secondary | ICD-10-CM

## 2023-03-30 DIAGNOSIS — I129 Hypertensive chronic kidney disease with stage 1 through stage 4 chronic kidney disease, or unspecified chronic kidney disease: Secondary | ICD-10-CM | POA: Diagnosis not present

## 2023-03-30 DIAGNOSIS — N1831 Chronic kidney disease, stage 3a: Secondary | ICD-10-CM | POA: Diagnosis not present

## 2023-03-30 DIAGNOSIS — I480 Paroxysmal atrial fibrillation: Secondary | ICD-10-CM | POA: Diagnosis not present

## 2023-03-30 MED ORDER — DAPAGLIFLOZIN PROPANEDIOL 10 MG PO TABS
10.0000 mg | ORAL_TABLET | Freq: Every day | ORAL | 1 refills | Status: DC
Start: 2023-03-30 — End: 2023-12-08

## 2023-03-30 MED ORDER — FUROSEMIDE 40 MG PO TABS: 40.0000 mg | ORAL_TABLET | ORAL | 1 refills | Status: DC

## 2023-03-30 MED ORDER — POTASSIUM CHLORIDE CRYS ER 20 MEQ PO TBCR
20.0000 meq | EXTENDED_RELEASE_TABLET | Freq: Every morning | ORAL | 1 refills | Status: AC
Start: 2023-03-30 — End: ?

## 2023-03-30 MED ORDER — KERENDIA 10 MG PO TABS
1.0000 | ORAL_TABLET | Freq: Every day | ORAL | 2 refills | Status: DC
Start: 2023-03-30 — End: 2023-04-29

## 2023-03-30 NOTE — Progress Notes (Signed)
Primary Physician/Referring:  Carol Ada, MD  Patient ID: Joseph [name], male    DOB: 1941/03/19, 82 y.o.   MRN: [id number]  Chief Complaint  Patient presents with   Atrial Fibrillation   Follow-up   HPI:    Joseph [name]  is a 82 y.o. Caucasian male patient with paroxysmal atrial fibrillation SP atrial fibrillation ablation on 2013, has had cardioversion in 2015, presently on chronic amiodarone, ascending thoracic aortic aneurysm being followed by Dr. Thayer Headings stable at around 5 cm since 2017 or longer, primary hypertension, hypercholesterolemia, morbid obesity, negative sleep study in the past referred to me for evaluation of worsening leg edema and dyspnea on exertion.  Patient has been chronically ill with regard to his dyspnea, leg edema and atrial fibrillation issues.  He is markedly sedentary.  His wife is present.  I had seen him 2 weeks ago for acute decompensated heart failure and recurrence of atrial fibrillation.  He now presents for follow-up, states that he is feeling remarkably well and his dyspnea has markedly improved leg edema has improved and he has lost about 16 to 18 pounds in weight and has been extremely careful with his diet.  No further PND, denies chest pain or palpitations.  Past Medical History:  Diagnosis Date   Allergy    Atrial fibrillation 11/2008   Atypical atrial flutter 07/13/2014   Colon polyp    Depression    Diverticulosis    Dysrhythmia    GERD (gastroesophageal reflux disease)    Headache(784.0)    Hyperlipidemia    Hypertension    Panic attacks    PONV (postoperative nausea and vomiting)    Thoracic aneurysm    Past Surgical History:  Procedure Laterality Date   ablataion  07/06/2012   APPENDECTOMY  1958   BIOPSY  12/03/2022   Procedure: BIOPSY;  Surgeon: Ladene Artist, MD;  Location: [facility];  Service: Gastroenterology;;   CARDIAC CATHETERIZATION  2009   CARDIOVERSION  03/18/2012   Procedure: CARDIOVERSION;  Surgeon:  [name], MD;  Location: [facility];  Service: Cardiovascular;  Laterality: N/A;   CARDIOVERSION N/A 08/23/2014   Procedure: CARDIOVERSION;  Surgeon: [name], MD;  Location: [place];  Service: Cardiovascular;  Laterality: N/A;   COLONOSCOPY WITH PROPOFOL N/A 12/03/2022   Procedure: COLONOSCOPY WITH PROPOFOL;  Surgeon: Ladene Artist, MD;  Location: [facility];  Service: Gastroenterology;  Laterality: N/A;   ESOPHAGOGASTRODUODENOSCOPY (EGD) WITH PROPOFOL N/A 12/03/2022   Procedure: ESOPHAGOGASTRODUODENOSCOPY (EGD) WITH PROPOFOL;  Surgeon: Ladene Artist, MD;  Location: [facility];  Service: Gastroenterology;  Laterality: N/A;   INGUINAL HERNIA REPAIR     right   LUMBAR LAMINECTOMY/DECOMPRESSION MICRODISCECTOMY Bilateral 02/08/2015   Procedure: Laminectomy and Foraminotomy - bilateral - Lumbar two-three, three-four, left lumbar four-five with resection of synovial cyst ;  Surgeon: Charlie Pitter, MD;  Location: [facility];  Service: Neurosurgery;  Laterality: Bilateral;   POLYPECTOMY  12/03/2022   Procedure: POLYPECTOMY;  Surgeon: Ladene Artist, MD;  Location: [facility];  Service: Gastroenterology;;   spinal tap     VASECTOMY     Family History  Problem Relation Age of Onset   Heart disease Mother 59       mother   Heart attack Mother    Lung cancer Father    Heart disease Father    Kidney disease Brother 83       died age 83 heart attack   Heart attack  Brother    Heart disease Maternal Grandfather    Colon cancer Neg Hx     Social History   Tobacco Use   Smoking status: Never   Smokeless tobacco: Never   Tobacco comments:    Never smoke 11/23/22  Substance Use Topics   Alcohol use: No   Marital Status: Married  ROS  Review of Systems  Constitutional: Positive for malaise/fatigue.  Cardiovascular:  Positive for dyspnea on exertion and leg swelling. Negative for chest pain.   Objective      03/30/2023   10:08 AM 03/18/2023   11:28 AM 02/26/2023    10:32 AM  Vitals with BMI  Height 5\' 9"  5\' 9"  5\' 9"   Weight 352 lbs 362 lbs 368 lbs 13 oz  BMI 51.96 123456 [id number]  Systolic 123456 123456 [id number]  Diastolic 58 92 60  Pulse 68 74 76   SpO2: 97 %  Physical Exam Constitutional:      Appearance: He is morbidly obese.  Neck:     Vascular: No carotid bruit or JVD (Short neck).  Cardiovascular:     Rate and Rhythm: Normal rate. Rhythm irregular.     Pulses:          Dorsalis pedis pulses are 0 on the right side and 0 on the left side.       Posterior tibial pulses are 0 on the right side and 0 on the left side.     Heart sounds: No murmur heard.    Comments: Difficult to palpate his popliteal and femoral arteries due to body habitus Pulmonary:     Effort: Pulmonary effort is normal.     Breath sounds: Normal breath sounds.  Abdominal:     General: Abdomen is protuberant. Bowel sounds are normal.     Palpations: Abdomen is soft.  Musculoskeletal:     Right lower leg: Edema (2+ pitting below knee) present.     Left lower leg: Edema (2+ pitting below knee) present.  Skin:    Capillary Refill: Capillary refill takes less than 2 seconds.      Laboratory examination:   Recent Labs    02/26/23 1137 03/18/23 1327 03/25/23 1048  NA 140 139 134  K 4.4 4.2 4.5  CL 102 103 97  CO2 19* 22 18*  GLUCOSE 110* 96 101*  BUN 19 21 22   CREATININE 1.35* 1.34* 1.39*  CALCIUM 9.3 9.1 9.6   Lab Results  Component Value Date   GLUCOSE 101 (H) 03/25/2023   NA 134 03/25/2023   K 4.5 03/25/2023   CL 97 03/25/2023   CO2 18 (L) 03/25/2023   BUN 22 03/25/2023   CREATININE 1.39 (H) 03/25/2023   EGFR 51 (L) 03/25/2023   CALCIUM 9.6 03/25/2023   PROT 6.9 11/01/2019   ALBUMIN 4.7 11/01/2019   BILITOT 0.4 11/01/2019   ALKPHOS 66 11/01/2019   AST 21 11/01/2019   ALT 20 11/01/2019   ANIONGAP 6 01/31/2015      TSH Recent Labs    10/12/22 1407  TSH 2.890   BNP (last 3 results) No results for input(s): "BNP" in the last 8760 hours.  ProBNP  (last 3 results) Recent Labs    10/12/22 1407 03/18/23 1327 03/25/23 1052  PROBNP 913* 1,150* 1,124*    External labs:   Labs 03/03/2023:  Serum glucose 97 mg, BUN 19, creatinine 1.38, EGFR 50 to mL, potassium 4.2.  LFTs normal.  Iron studies reveal mild decrease in iron.  Labs 12/29/2022:  Hb 10.1/HCT 32.9, platelets 346, microcytic indicis.  Cholesterol, total 129.000 m 07/23/2022 HDL 62.000 mg 07/23/2022 LDL 51.000 mg 07/23/2022 Triglycerides 76.000 mg 07/23/2022  Radiology:   CT angiogram chest 01/26/2023: 1. The previous nodular ground-glass opacities have nearly resolved compatible with resolving infection. 2. Cardiomegaly. New pulmonary edema since 10/21/2022. Trace right pleural effusion. 3. Unchanged 49 mm ascending aortic aneurysm. Ascending thoracic aortic aneurysm. Recommend semi-annual imaging followup by CTA or MRA and referral to cardiothoracic surgery if not already obtained. Aortic aneurysm stable since 2017.  Chest x-ray two-view 02/28/2023: Cardiac silhouette is prominent. There is pulmonary interstitial prominence with vascular congestion. No focal consolidation. No pneumothorax or pleural effusion identified. There are thoracic degenerative changes. IMPRESSION: Findings suggest CHF.  PFTs 02/02/2023: Moderately severe restrictive defect with mild reduction in DLCO  Cardiac Studies:   Left Heart Catheterization 12/13/2008:  1. Hemodynamic data:      a.     Aortic pressure 126/76.      b.     Left ventricular pressure 128/40 mmHg.  2. Left ventriculography:  LV cavity size and function are normal.  EF is 60%.  No obvious mitral regurgitation is noted.  3. Coronary angiography.      a.     Left main coronary artery:  Normal.      b.     Left anterior descending coronary artery:  Normal.  The vessel gives origin to 2 diagonal branches also normal.  The LAD is transapical.      c.     Circumflex artery:  The circumflex coronary artery gives origin to 3  marginals, they are normal.      d.     Right coronary artery:  The right coronary artery is normal. The PDA is large and normal.  Echocardiogram 02/24/2023: 1. Left ventricular ejection fraction, by estimation, is 60 to 65%. The left ventricle has normal function. The left ventricle has no regional wall motion abnormalities. There is mild left ventricular hypertrophy. Left ventricular diastolic parameters  were normal.  2. Right ventricular systolic function is normal. The right ventricular size is normal. There is moderately elevated pulmonary artery systolic pressure. The estimated right ventricular systolic pressure is [id number] mmHg.  3. Left atrial size was mildly dilated.  4. Right atrial size was mildly dilated.  5. The mitral valve is normal in structure. No evidence of mitral valve regurgitation. No evidence of mitral stenosis.  6. The aortic valve is normal in structure. Aortic valve regurgitation is not visualized. No aortic stenosis is present.  7. There is severe dilatation of the ascending aorta, measuring 50 mm.  8. The inferior vena cava is dilated in size with <50% respiratory variability, suggesting right atrial pressure of 15 mmHg.  EKG:   EKG 03/30/2023: Atypical atrial flutter with variable AV conduction at rate of 65 bpm, left axis deviation, left anterior fascicular block.  Incomplete right bundle branch block.  Poor R progression, cannot exclude anterolateral infarct old.  Low-voltage complexes, consider pulmonary disease pattern.  Compared to 03/18/2023, atrial fibrillation now appears to be atypical a flutter.  There is baseline artifact overall no significant change.Compared to 11/23/2022, sinus rhythm with first-degree AV block has been replaced.  Medications and allergies   Allergies  Allergen Reactions   Amiodarone Other (See Comments)    Pulmonary toxicity   Eszopiclone     Other Reaction(s): not effective    Medication list    Current Outpatient Medications:     acetaminophen (TYLENOL) 500 MG  tablet, Take 500 mg by mouth every 6 (six) hours as needed (pain)., Disp: , Rfl:    albuterol (VENTOLIN HFA) 108 (90 Base) MCG/ACT inhaler, Inhale 2 puffs into the lungs every 6 (six) hours as needed for wheezing or shortness of breath., Disp: 8 g, Rfl: 3   ALPRAZolam (XANAX) 0.5 MG tablet, Take 0.25-0.5 mg by mouth 3 (three) times daily as needed (ear ringing.)., Disp: , Rfl:    Azelaic Acid (FINACEA) 15 % gel, 1 application Externally Twice a day, Disp: , Rfl:    finasteride (PROSCAR) 5 MG tablet, Take 5 mg by mouth daily., Disp: , Rfl:    Finerenone (KERENDIA) 10 MG TABS, Take 1 tablet (10 mg total) by mouth daily., Disp: 90 tablet, Rfl: 2   metoprolol succinate (TOPROL-XL) 100 MG 24 hr tablet, Take 100 mg by mouth in the morning. Take with or immediately following a meal., Disp: , Rfl:    olmesartan (BENICAR) 40 MG tablet, Take 40 mg by mouth daily., Disp: , Rfl:    pantoprazole (PROTONIX) 40 MG tablet, Take 40 mg by mouth in the morning., Disp: , Rfl: 0   rosuvastatin (CRESTOR) 20 MG tablet, TAKE 1 TABLET(20 MG) BY MOUTH DAILY, Disp: 90 tablet, Rfl: 1   sertraline (ZOLOFT) 100 MG tablet, Take 100 mg by mouth in the morning., Disp: , Rfl: 0   tamsulosin (FLOMAX) 0.4 MG CAPS capsule, Take 0.4 mg by mouth daily after breakfast., Disp: , Rfl:    triamcinolone cream (KENALOG) 0.1 %, Apply 1 Application topically as needed., Disp: , Rfl:    XARELTO 20 MG TABS tablet, TAKE 1 TABLET(20 MG) BY MOUTH DAILY WITH SUPPER, Disp: 90 tablet, Rfl: 1   dapagliflozin propanediol (FARXIGA) 10 MG TABS tablet, Take 1 tablet (10 mg total) by mouth daily before breakfast., Disp: 90 tablet, Rfl: 1   Ferrous Sulfate (IRON) 325 (65 Fe) MG TABS, 1 tablet Orally Three times a Week (Patient not taking: Reported on 03/30/2023), Disp: , Rfl:    furosemide (LASIX) 40 MG tablet, Take 1 tablet (40 mg total) by mouth every morning., Disp: 90 tablet, Rfl: 1   potassium chloride SA (KLOR-CON M) 20 MEQ  tablet, Take 1 tablet (20 mEq total) by mouth in the morning., Disp: 90 tablet, Rfl: 1   Assessment     ICD-10-CM   1. Paroxysmal atrial fibrillation  I48.0 EKG 12-Lead    2. Acute on chronic diastolic heart failure  [id number] furosemide (LASIX) 40 MG tablet    dapagliflozin propanediol (FARXIGA) 10 MG TABS tablet    potassium chloride SA (KLOR-CON M) 20 MEQ tablet    Finerenone (KERENDIA) 10 MG TABS    3. Stage 3a chronic kidney disease  N18.31 Finerenone (KERENDIA) 10 MG TABS       Orders Placed This Encounter  Procedures   EKG 12-Lead    Meds ordered this encounter  Medications   furosemide (LASIX) 40 MG tablet    Sig: Take 1 tablet (40 mg total) by mouth every morning.    Dispense:  90 tablet    Refill:  1   dapagliflozin propanediol (FARXIGA) 10 MG TABS tablet    Sig: Take 1 tablet (10 mg total) by mouth daily before breakfast.    Dispense:  90 tablet    Refill:  1   potassium chloride SA (KLOR-CON M) 20 MEQ tablet    Sig: Take 1 tablet (20 mEq total) by mouth in the morning.    Dispense:  90 tablet  Refill:  1   Finerenone (KERENDIA) 10 MG TABS    Sig: Take 1 tablet (10 mg total) by mouth daily.    Dispense:  90 tablet    Refill:  2    Medications Discontinued During This Encounter  Medication Reason   Multiple Minerals-Vitamins (CAL-MAG-ZINC-D) TABS    olmesartan (BENICAR) 40 MG tablet    Multiple Vitamins-Minerals (CENTRUM SILVER) tablet    tadalafil, PAH, (ADCIRCA) 20 MG tablet    cyanocobalamin (VITAMIN B12) 1000 MCG tablet Patient Preference   Cholecalciferol (VITAMIN D3) 25 MCG (1000 UT) CAPS Patient Preference   furosemide (LASIX) 20 MG tablet Reorder   dapagliflozin propanediol (FARXIGA) 10 MG TABS tablet Reorder   potassium chloride SA (K-DUR,KLOR-CON) 20 MEQ tablet Reorder     Recommendations:   Joseph [name] is a 82 y.o. Caucasian male patient with paroxysmal atrial fibrillation SP atrial fibrillation ablation on 2013, ascending thoracic aortic  aneurysm being followed by Dr. Thayer Headings stable at around 5 cm since 2017 or longer, primary hypertension, hypercholesterolemia, morbid obesity, negative sleep study in the past referred to me for evaluation of worsening leg edema and dyspnea on exertion.    ICD-10-CM   1. Paroxysmal atrial fibrillation  I48.0 EKG 12-Lead    2. Acute on chronic diastolic heart failure  Q000111Q furosemide (LASIX) 40 MG tablet    dapagliflozin propanediol (FARXIGA) 10 MG TABS tablet    potassium chloride SA (KLOR-CON M) 20 MEQ tablet    Finerenone (KERENDIA) 10 MG TABS    3. Stage 3a chronic kidney disease  N18.31 Finerenone (KERENDIA) 10 MG TABS       1. Paroxysmal atrial fibrillation (HCC) Patient was seen by me 2 weeks ago when he established with me, presenting with acute decompensated diastolic heart failure and recurrence of paroxysmal atrial fibrillation.  He had maintained sinus rhythm for most 8 years.  He is now rate controlled, continues to be in atypical atrial flutter/atrial fibrillation.  I had discontinued amiodarone on his last office visit in view of suspicion for pulmonary toxicity.  His options would be either consider sotalol versus Tikosyn.  However I would like to schedule him for direct-current cardioversion now that his acute decompensated heart failure is much more compensated and I expect that he will continue to do well over the next few weeks.  He has lost about 15 to 20 pounds in weight.  Breathing is significantly improved and lungs are clear.  2. Acute on chronic diastolic heart failure ([place]) Presently tolerating Joseph [name], he is also on furosemide 40 mg daily, continue the same.  In view of stage IIIa chronic kidney disease, I did not want to change him from ARB to ARB/ARNI combination.  Once he is stable from atrial fibrillation and acute decompensated heart failure, will do ischemic workup.  He has  3. Stage 3a chronic kidney disease ([place]) I been monitoring his chronic kidney  disease very closely, fortunately stage III chronic kidney disease has remained stable.  [facility] would be an excellent choice in view of heart failure.  Prescription sent, I will closely continue to monitor his renal function.  Potassium may need to be discontinued if his potassium levels goes up.  He is diuresing extremely well.  He is also very motivated and weight loss as well.  I would like to see him back in 4 weeks but in the interim I am going to set him up for direct-current cardioversion.     Adrian Prows, MD, [facility] 03/30/2023,  12:11 PM Office: [phone]

## 2023-03-30 NOTE — H&P (View-Only) (Signed)
 Primary Physician/Referring:  Smith, Candace, MD  Patient ID: Joseph [name], male    DOB: 09/30/1941, 82 y.o.   MRN: [id number]  Chief Complaint  Patient presents with   Atrial Fibrillation   Follow-up   HPI:    Joseph [name]  is a 82 y.o. Caucasian male patient with paroxysmal atrial fibrillation SP atrial fibrillation ablation on 2013, has had cardioversion in 2015, presently on chronic amiodarone, ascending thoracic aortic aneurysm being followed by Dr. Bryan Batle stable at around 5 cm since 2017 or longer, primary hypertension, hypercholesterolemia, morbid obesity, negative sleep study in the past referred to me for evaluation of worsening leg edema and dyspnea on exertion.  Patient has been chronically ill with regard to his dyspnea, leg edema and atrial fibrillation issues.  He is markedly sedentary.  His wife is present.  I had seen him 2 weeks ago for acute decompensated heart failure and recurrence of atrial fibrillation.  He now presents for follow-up, states that he is feeling remarkably well and his dyspnea has markedly improved leg edema has improved and he has lost about 16 to 18 pounds in weight and has been extremely careful with his diet.  No further PND, denies chest pain or palpitations.  Past Medical History:  Diagnosis Date   Allergy    Atrial fibrillation 11/2008   Atypical atrial flutter 07/13/2014   Colon polyp    Depression    Diverticulosis    Dysrhythmia    GERD (gastroesophageal reflux disease)    Headache(784.0)    Hyperlipidemia    Hypertension    Panic attacks    PONV (postoperative nausea and vomiting)    Thoracic aneurysm    Past Surgical History:  Procedure Laterality Date   ablataion  07/06/2012   APPENDECTOMY  1958   BIOPSY  12/03/2022   Procedure: BIOPSY;  Surgeon: Stark, Malcolm T, MD;  Location: [facility];  Service: Gastroenterology;;   CARDIAC CATHETERIZATION  2009   CARDIOVERSION  03/18/2012   Procedure: CARDIOVERSION;  Surgeon:  Henry W Smith III, MD;  Location: [facility];  Service: Cardiovascular;  Laterality: N/A;   CARDIOVERSION N/A 08/23/2014   Procedure: CARDIOVERSION;  Surgeon: Henry W Smith III, MD;  Location: [facility];  Service: Cardiovascular;  Laterality: N/A;   COLONOSCOPY WITH PROPOFOL N/A 12/03/2022   Procedure: COLONOSCOPY WITH PROPOFOL;  Surgeon: Stark, Malcolm T, MD;  Location: [facility];  Service: Gastroenterology;  Laterality: N/A;   ESOPHAGOGASTRODUODENOSCOPY (EGD) WITH PROPOFOL N/A 12/03/2022   Procedure: ESOPHAGOGASTRODUODENOSCOPY (EGD) WITH PROPOFOL;  Surgeon: Stark, Malcolm T, MD;  Location: [facility];  Service: Gastroenterology;  Laterality: N/A;   INGUINAL HERNIA REPAIR     right   LUMBAR LAMINECTOMY/DECOMPRESSION MICRODISCECTOMY Bilateral 02/08/2015   Procedure: Laminectomy and Foraminotomy - bilateral - Lumbar two-three, three-four, left lumbar four-five with resection of synovial cyst ;  Surgeon: Henry A Pool, MD;  Location: [facility];  Service: Neurosurgery;  Laterality: Bilateral;   POLYPECTOMY  12/03/2022   Procedure: POLYPECTOMY;  Surgeon: Stark, Malcolm T, MD;  Location: [facility];  Service: Gastroenterology;;   spinal tap     VASECTOMY     Family History  Problem Relation Age of Onset   Heart disease Mother 72       mother   Heart attack Mother    Lung cancer Father    Heart disease Father    Kidney disease Brother 20       died age 41 heart attack   Heart attack   Brother    Heart disease Maternal Grandfather    Colon cancer Neg Hx     Social History   Tobacco Use   Smoking status: Never   Smokeless tobacco: Never   Tobacco comments:    Never smoke 11/23/22  Substance Use Topics   Alcohol use: No   Marital Status: Married  ROS  Review of Systems  Constitutional: Positive for malaise/fatigue.  Cardiovascular:  Positive for dyspnea on exertion and leg swelling. Negative for chest pain.   Objective      03/30/2023   10:08 AM 03/18/2023   11:28 AM 02/26/2023    10:32 AM  Vitals with BMI  Height 5' 9" 5' 9" 5' 9"  Weight 352 lbs 362 lbs 368 lbs 13 oz  BMI 51.96 53.43 54.44  Systolic 120 144 110  Diastolic 58 92 60  Pulse 68 74 76   SpO2: 97 %  Physical Exam Constitutional:      Appearance: He is morbidly obese.  Neck:     Vascular: No carotid bruit or JVD (Short neck).  Cardiovascular:     Rate and Rhythm: Normal rate. Rhythm irregular.     Pulses:          Dorsalis pedis pulses are 0 on the right side and 0 on the left side.       Posterior tibial pulses are 0 on the right side and 0 on the left side.     Heart sounds: No murmur heard.    Comments: Difficult to palpate his popliteal and femoral arteries due to body habitus Pulmonary:     Effort: Pulmonary effort is normal.     Breath sounds: Normal breath sounds.  Abdominal:     General: Abdomen is protuberant. Bowel sounds are normal.     Palpations: Abdomen is soft.  Musculoskeletal:     Right lower leg: Edema (2+ pitting below knee) present.     Left lower leg: Edema (2+ pitting below knee) present.  Skin:    Capillary Refill: Capillary refill takes less than 2 seconds.      Laboratory examination:   Recent Labs    02/26/23 1137 03/18/23 1327 03/25/23 1048  NA 140 139 134  K 4.4 4.2 4.5  CL 102 103 97  CO2 19* 22 18*  GLUCOSE 110* 96 101*  BUN 19 21 22  CREATININE 1.35* 1.34* 1.39*  CALCIUM 9.3 9.1 9.6   Lab Results  Component Value Date   GLUCOSE 101 (H) 03/25/2023   NA 134 03/25/2023   K 4.5 03/25/2023   CL 97 03/25/2023   CO2 18 (L) 03/25/2023   BUN 22 03/25/2023   CREATININE 1.39 (H) 03/25/2023   EGFR 51 (L) 03/25/2023   CALCIUM 9.6 03/25/2023   PROT 6.9 11/01/2019   ALBUMIN 4.7 11/01/2019   BILITOT 0.4 11/01/2019   ALKPHOS 66 11/01/2019   AST 21 11/01/2019   ALT 20 11/01/2019   ANIONGAP 6 01/31/2015      TSH Recent Labs    10/12/22 1407  TSH 2.890   BNP (last 3 results) No results for input(s): "BNP" in the last 8760 hours.  ProBNP  (last 3 results) Recent Labs    10/12/22 1407 03/18/23 1327 03/25/23 1052  PROBNP 913* 1,150* 1,124*    External labs:   Labs 03/03/2023:  Serum glucose 97 mg, BUN 19, creatinine 1.38, EGFR 50 to mL, potassium 4.2.  LFTs normal.  Iron studies reveal mild decrease in iron.  Labs 12/29/2022:    Hb 10.1/HCT 32.9, platelets 346, microcytic indicis.  Cholesterol, total 129.000 m 07/23/2022 HDL 62.000 mg 07/23/2022 LDL 51.000 mg 07/23/2022 Triglycerides 76.000 mg 07/23/2022  Radiology:   CT angiogram chest 01/26/2023: 1. The previous nodular ground-glass opacities have nearly resolved compatible with resolving infection. 2. Cardiomegaly. New pulmonary edema since 10/21/2022. Trace right pleural effusion. 3. Unchanged 49 mm ascending aortic aneurysm. Ascending thoracic aortic aneurysm. Recommend semi-annual imaging followup by CTA or MRA and referral to cardiothoracic surgery if not already obtained. Aortic aneurysm stable since 2017.  Chest x-ray two-view 02/28/2023: Cardiac silhouette is prominent. There is pulmonary interstitial prominence with vascular congestion. No focal consolidation. No pneumothorax or pleural effusion identified. There are thoracic degenerative changes. IMPRESSION: Findings suggest CHF.  PFTs 02/02/2023: Moderately severe restrictive defect with mild reduction in DLCO  Cardiac Studies:   Left Heart Catheterization 12/13/2008:  1. Hemodynamic data:      a.     Aortic pressure 126/76.      b.     Left ventricular pressure 128/40 mmHg.  2. Left ventriculography:  LV cavity size and function are normal.  EF is 60%.  No obvious mitral regurgitation is noted.  3. Coronary angiography.      a.     Left main coronary artery:  Normal.      b.     Left anterior descending coronary artery:  Normal.  The vessel gives origin to 2 diagonal branches also normal.  The LAD is transapical.      c.     Circumflex artery:  The circumflex coronary artery gives origin to 3  marginals, they are normal.      d.     Right coronary artery:  The right coronary artery is normal. The PDA is large and normal.  Echocardiogram 02/24/2023: 1. Left ventricular ejection fraction, by estimation, is 60 to 65%. The left ventricle has normal function. The left ventricle has no regional wall motion abnormalities. There is mild left ventricular hypertrophy. Left ventricular diastolic parameters  were normal.  2. Right ventricular systolic function is normal. The right ventricular size is normal. There is moderately elevated pulmonary artery systolic pressure. The estimated right ventricular systolic pressure is 59.2 mmHg.  3. Left atrial size was mildly dilated.  4. Right atrial size was mildly dilated.  5. The mitral valve is normal in structure. No evidence of mitral valve regurgitation. No evidence of mitral stenosis.  6. The aortic valve is normal in structure. Aortic valve regurgitation is not visualized. No aortic stenosis is present.  7. There is severe dilatation of the ascending aorta, measuring 50 mm.  8. The inferior vena cava is dilated in size with <50% respiratory variability, suggesting right atrial pressure of 15 mmHg.  EKG:   EKG 03/30/2023: Atypical atrial flutter with variable AV conduction at rate of 65 bpm, left axis deviation, left anterior fascicular block.  Incomplete right bundle branch block.  Poor R progression, cannot exclude anterolateral infarct old.  Low-voltage complexes, consider pulmonary disease pattern.  Compared to 03/18/2023, atrial fibrillation now appears to be atypical a flutter.  There is baseline artifact overall no significant change.Compared to 11/23/2022, sinus rhythm with first-degree AV block has been replaced.  Medications and allergies   Allergies  Allergen Reactions   Amiodarone Other (See Comments)    Pulmonary toxicity   Eszopiclone     Other Reaction(s): not effective    Medication list    Current Outpatient Medications:     acetaminophen (TYLENOL) 500 MG   tablet, Take 500 mg by mouth every 6 (six) hours as needed (pain)., Disp: , Rfl:    albuterol (VENTOLIN HFA) 108 (90 Base) MCG/ACT inhaler, Inhale 2 puffs into the lungs every 6 (six) hours as needed for wheezing or shortness of breath., Disp: 8 g, Rfl: 3   ALPRAZolam (XANAX) 0.5 MG tablet, Take 0.25-0.5 mg by mouth 3 (three) times daily as needed (ear ringing.)., Disp: , Rfl:    Azelaic Acid (FINACEA) 15 % gel, 1 application Externally Twice a day, Disp: , Rfl:    finasteride (PROSCAR) 5 MG tablet, Take 5 mg by mouth daily., Disp: , Rfl:    Finerenone (KERENDIA) 10 MG TABS, Take 1 tablet (10 mg total) by mouth daily., Disp: 90 tablet, Rfl: 2   metoprolol succinate (TOPROL-XL) 100 MG 24 hr tablet, Take 100 mg by mouth in the morning. Take with or immediately following a meal., Disp: , Rfl:    olmesartan (BENICAR) 40 MG tablet, Take 40 mg by mouth daily., Disp: , Rfl:    pantoprazole (PROTONIX) 40 MG tablet, Take 40 mg by mouth in the morning., Disp: , Rfl: 0   rosuvastatin (CRESTOR) 20 MG tablet, TAKE 1 TABLET(20 MG) BY MOUTH DAILY, Disp: 90 tablet, Rfl: 1   sertraline (ZOLOFT) 100 MG tablet, Take 100 mg by mouth in the morning., Disp: , Rfl: 0   tamsulosin (FLOMAX) 0.4 MG CAPS capsule, Take 0.4 mg by mouth daily after breakfast., Disp: , Rfl:    triamcinolone cream (KENALOG) 0.1 %, Apply 1 Application topically as needed., Disp: , Rfl:    XARELTO 20 MG TABS tablet, TAKE 1 TABLET(20 MG) BY MOUTH DAILY WITH SUPPER, Disp: 90 tablet, Rfl: 1   dapagliflozin propanediol (FARXIGA) 10 MG TABS tablet, Take 1 tablet (10 mg total) by mouth daily before breakfast., Disp: 90 tablet, Rfl: 1   Ferrous Sulfate (IRON) 325 (65 Fe) MG TABS, 1 tablet Orally Three times a Week (Patient not taking: Reported on 03/30/2023), Disp: , Rfl:    furosemide (LASIX) 40 MG tablet, Take 1 tablet (40 mg total) by mouth every morning., Disp: 90 tablet, Rfl: 1   potassium chloride SA (KLOR-CON M) 20 MEQ  tablet, Take 1 tablet (20 mEq total) by mouth in the morning., Disp: 90 tablet, Rfl: 1   Assessment     ICD-10-CM   1. Paroxysmal atrial fibrillation  I48.0 EKG 12-Lead    2. Acute on chronic diastolic heart failure  I50.33 furosemide (LASIX) 40 MG tablet    dapagliflozin propanediol (FARXIGA) 10 MG TABS tablet    potassium chloride SA (KLOR-CON M) 20 MEQ tablet    Finerenone (KERENDIA) 10 MG TABS    3. Stage 3a chronic kidney disease  N18.31 Finerenone (KERENDIA) 10 MG TABS       Orders Placed This Encounter  Procedures   EKG 12-Lead    Meds ordered this encounter  Medications   furosemide (LASIX) 40 MG tablet    Sig: Take 1 tablet (40 mg total) by mouth every morning.    Dispense:  90 tablet    Refill:  1   dapagliflozin propanediol (FARXIGA) 10 MG TABS tablet    Sig: Take 1 tablet (10 mg total) by mouth daily before breakfast.    Dispense:  90 tablet    Refill:  1   potassium chloride SA (KLOR-CON M) 20 MEQ tablet    Sig: Take 1 tablet (20 mEq total) by mouth in the morning.    Dispense:  90 tablet      Refill:  1   Finerenone (KERENDIA) 10 MG TABS    Sig: Take 1 tablet (10 mg total) by mouth daily.    Dispense:  90 tablet    Refill:  2    Medications Discontinued During This Encounter  Medication Reason   Multiple Minerals-Vitamins (CAL-MAG-ZINC-D) TABS    olmesartan (BENICAR) 40 MG tablet    Multiple Vitamins-Minerals (CENTRUM SILVER) tablet    tadalafil, PAH, (ADCIRCA) 20 MG tablet    cyanocobalamin (VITAMIN B12) 1000 MCG tablet Patient Preference   Cholecalciferol (VITAMIN D3) 25 MCG (1000 UT) CAPS Patient Preference   furosemide (LASIX) 20 MG tablet Reorder   dapagliflozin propanediol (FARXIGA) 10 MG TABS tablet Reorder   potassium chloride SA (K-DUR,KLOR-CON) 20 MEQ tablet Reorder     Recommendations:   Joseph [name] is a 81 y.o. Caucasian male patient with paroxysmal atrial fibrillation SP atrial fibrillation ablation on 2013, ascending thoracic aortic  aneurysm being followed by Dr. Bryan Batle stable at around 5 cm since 2017 or longer, primary hypertension, hypercholesterolemia, morbid obesity, negative sleep study in the past referred to me for evaluation of worsening leg edema and dyspnea on exertion.    ICD-10-CM   1. Paroxysmal atrial fibrillation  I48.0 EKG 12-Lead    2. Acute on chronic diastolic heart failure  I50.33 furosemide (LASIX) 40 MG tablet    dapagliflozin propanediol (FARXIGA) 10 MG TABS tablet    potassium chloride SA (KLOR-CON M) 20 MEQ tablet    Finerenone (KERENDIA) 10 MG TABS    3. Stage 3a chronic kidney disease  N18.31 Finerenone (KERENDIA) 10 MG TABS       1. Paroxysmal atrial fibrillation (HCC) Patient was seen by me 2 weeks ago when he established with me, presenting with acute decompensated diastolic heart failure and recurrence of paroxysmal atrial fibrillation.  He had maintained sinus rhythm for most 8 years.  He is now rate controlled, continues to be in atypical atrial flutter/atrial fibrillation.  I had discontinued amiodarone on his last office visit in view of suspicion for pulmonary toxicity.  His options would be either consider sotalol versus Tikosyn.  However I would like to schedule him for direct-current cardioversion now that his acute decompensated heart failure is much more compensated and I expect that he will continue to do well over the next few weeks.  He has lost about 15 to 20 pounds in weight.  Breathing is significantly improved and lungs are clear.  2. Acute on chronic diastolic heart failure (HCC) Presently tolerating Farxiga, he is also on furosemide 40 mg daily, continue the same.  In view of stage IIIa chronic kidney disease, I did not want to change him from ARB to ARB/ARNI combination.  Once he is stable from atrial fibrillation and acute decompensated heart failure, will do ischemic workup.  He has  3. Stage 3a chronic kidney disease (HCC) I been monitoring his chronic kidney  disease very closely, fortunately stage III chronic kidney disease has remained stable.  Kerendia would be an excellent choice in view of heart failure.  Prescription sent, I will closely continue to monitor his renal function.  Potassium may need to be discontinued if his potassium levels goes up.  He is diuresing extremely well.  He is also very motivated and weight loss as well.  I would like to see him back in 4 weeks but in the interim I am going to set him up for direct-current cardioversion.     Jerah Esty, MD, FACC 03/30/2023,   12:11 PM Office: [phone] 

## 2023-04-01 DIAGNOSIS — I5033 Acute on chronic diastolic (congestive) heart failure: Secondary | ICD-10-CM | POA: Diagnosis not present

## 2023-04-02 ENCOUNTER — Encounter: Payer: Self-pay | Admitting: Cardiology

## 2023-04-05 ENCOUNTER — Encounter: Payer: Self-pay | Admitting: Cardiology

## 2023-04-05 NOTE — Telephone Encounter (Signed)
From patient.

## 2023-04-15 NOTE — Pre-Procedure Instructions (Signed)
Instructed patient on following items: Arrival time 1030 NPO after MN... Bp and anticoagulation meds ok with sips of water AM of procedure Responsible party to stay home after procedure and for 24hrs Have you missed any does of anticoagulant?Pt states no missed dose pt instructed to take medication as scheduled

## 2023-04-16 ENCOUNTER — Ambulatory Visit (HOSPITAL_COMMUNITY)
Admission: RE | Admit: 2023-04-16 | Discharge: 2023-04-16 | Disposition: A | Discharge status: Home / Self Care | Payer: Medicare PPO | Attending: Cardiology | Admitting: Cardiology

## 2023-04-16 ENCOUNTER — Encounter (HOSPITAL_COMMUNITY): Payer: Self-pay | Admitting: Cardiology

## 2023-04-16 ENCOUNTER — Other Ambulatory Visit: Payer: Self-pay

## 2023-04-16 ENCOUNTER — Ambulatory Visit (HOSPITAL_COMMUNITY): Payer: Medicare PPO | Admitting: Anesthesiology

## 2023-04-16 ENCOUNTER — Ambulatory Visit (HOSPITAL_BASED_OUTPATIENT_CLINIC_OR_DEPARTMENT_OTHER): Payer: Medicare PPO | Admitting: Anesthesiology

## 2023-04-16 ENCOUNTER — Encounter (HOSPITAL_COMMUNITY)
Admission: RE | Disposition: A | Discharge status: Home / Self Care | Payer: Self-pay | Source: Home / Self Care | Attending: Cardiology

## 2023-04-16 DIAGNOSIS — I7121 Aneurysm of the ascending aorta, without rupture: Secondary | ICD-10-CM | POA: Diagnosis not present

## 2023-04-16 DIAGNOSIS — I130 Hypertensive heart and chronic kidney disease with heart failure and stage 1 through stage 4 chronic kidney disease, or unspecified chronic kidney disease: Secondary | ICD-10-CM | POA: Insufficient documentation

## 2023-04-16 DIAGNOSIS — I4891 Unspecified atrial fibrillation: Secondary | ICD-10-CM | POA: Diagnosis not present

## 2023-04-16 DIAGNOSIS — Z8249 Family history of ischemic heart disease and other diseases of the circulatory system: Secondary | ICD-10-CM | POA: Insufficient documentation

## 2023-04-16 DIAGNOSIS — Z7901 Long term (current) use of anticoagulants: Secondary | ICD-10-CM | POA: Diagnosis not present

## 2023-04-16 DIAGNOSIS — Z7984 Long term (current) use of oral hypoglycemic drugs: Secondary | ICD-10-CM | POA: Insufficient documentation

## 2023-04-16 DIAGNOSIS — N1831 Chronic kidney disease, stage 3a: Secondary | ICD-10-CM | POA: Insufficient documentation

## 2023-04-16 DIAGNOSIS — E7800 Pure hypercholesterolemia, unspecified: Secondary | ICD-10-CM | POA: Diagnosis not present

## 2023-04-16 DIAGNOSIS — E1151 Type 2 diabetes mellitus with diabetic peripheral angiopathy without gangrene: Secondary | ICD-10-CM | POA: Diagnosis not present

## 2023-04-16 DIAGNOSIS — I739 Peripheral vascular disease, unspecified: Secondary | ICD-10-CM | POA: Diagnosis not present

## 2023-04-16 DIAGNOSIS — E119 Type 2 diabetes mellitus without complications: Secondary | ICD-10-CM | POA: Diagnosis not present

## 2023-04-16 DIAGNOSIS — Z841 Family history of disorders of kidney and ureter: Secondary | ICD-10-CM | POA: Insufficient documentation

## 2023-04-16 DIAGNOSIS — I10 Essential (primary) hypertension: Secondary | ICD-10-CM

## 2023-04-16 DIAGNOSIS — I5033 Acute on chronic diastolic (congestive) heart failure: Secondary | ICD-10-CM | POA: Insufficient documentation

## 2023-04-16 DIAGNOSIS — I480 Paroxysmal atrial fibrillation: Secondary | ICD-10-CM | POA: Insufficient documentation

## 2023-04-16 DIAGNOSIS — E1122 Type 2 diabetes mellitus with diabetic chronic kidney disease: Secondary | ICD-10-CM | POA: Diagnosis not present

## 2023-04-16 HISTORY — PX: CARDIOVERSION: SHX1299

## 2023-04-16 SURGERY — CARDIOVERSION
Anesthesia: General

## 2023-04-16 MED ORDER — PROPOFOL 10 MG/ML IV BOLUS
INTRAVENOUS | Status: DC | PRN
  Administered 2023-04-16: 60 mg via INTRAVENOUS

## 2023-04-16 MED ORDER — LIDOCAINE 2% (20 MG/ML) 5 ML SYRINGE
INTRAMUSCULAR | Status: DC | PRN
  Administered 2023-04-16: 60 mg via INTRAVENOUS

## 2023-04-16 MED ORDER — SODIUM CHLORIDE 0.9 % IV SOLN: INTRAVENOUS | Status: DC

## 2023-04-16 MED ORDER — SODIUM CHLORIDE 0.9 % IV SOLN: INTRAVENOUS | Status: DC | PRN

## 2023-04-16 SURGICAL SUPPLY — 1 items: ELECT DEFIB PAD ADLT CADENCE (PAD) ×1 IMPLANT

## 2023-04-16 NOTE — CV Procedure (Signed)
Direct current cardioversion 04/16/2023 11:18 AM  Indication symptomatic A. Fibrillation.  Procedure: Using 60 mg of IV Propofol and 60 IV Lidocaine (for reducing venous pain) for achieving deep sedation, synchronized direct current cardioversion performed. Patient was delivered with 150 Joules of electricity X 1 with success to NSR. Patient tolerated the procedure well. No immediate complication noted.   Allergies as of 04/16/2023       Reactions   Amiodarone Other (See Comments)   Pulmonary toxicity   Eszopiclone    Other Reaction(s): not effective        Medication List     TAKE these medications    acetaminophen 500 MG tablet Commonly known as: TYLENOL Take 500 mg by mouth every 6 (six) hours as needed for headache, moderate pain or mild pain (pain).   albuterol 108 (90 Base) MCG/ACT inhaler Commonly known as: VENTOLIN HFA Inhale 2 puffs into the lungs every 6 (six) hours as needed for wheezing or shortness of breath.   ALPRAZolam 0.5 MG tablet Commonly known as: XANAX Take 0.25-0.5 mg by mouth 3 (three) times daily as needed (ear ringing.).   dapagliflozin propanediol 10 MG Tabs tablet Commonly known as: Farxiga Take 1 tablet (10 mg total) by mouth daily before breakfast.   Finacea 15 % gel Generic drug: Azelaic Acid Apply 1 Application topically 2 (two) times daily as needed (Face).   finasteride 5 MG tablet Commonly known as: PROSCAR Take 5 mg by mouth daily.   furosemide 40 MG tablet Commonly known as: Lasix Take 1 tablet (40 mg total) by mouth every morning.   Kerendia 10 MG Tabs Generic drug: Finerenone Take 1 tablet (10 mg total) by mouth daily.   metoprolol succinate 100 MG 24 hr tablet Commonly known as: TOPROL-XL Take 100 mg by mouth in the morning. Take with or immediately following a meal.   olmesartan 40 MG tablet Commonly known as: BENICAR Take 40 mg by mouth daily.   pantoprazole 40 MG tablet Commonly known as: PROTONIX Take 40 mg by  mouth in the morning.   potassium chloride SA 20 MEQ tablet Commonly known as: KLOR-CON M Take 1 tablet (20 mEq total) by mouth in the morning.   rosuvastatin 20 MG tablet Commonly known as: CRESTOR TAKE 1 TABLET(20 MG) BY MOUTH DAILY   sertraline 100 MG tablet Commonly known as: ZOLOFT Take 100 mg by mouth in the morning.   tamsulosin 0.4 MG Caps capsule Commonly known as: FLOMAX Take 0.4 mg by mouth daily after breakfast.   triamcinolone cream 0.1 % Commonly known as: KENALOG Apply 1 Application topically 2 (two) times daily as needed (Rash).   Xarelto 20 MG Tabs tablet Generic drug: rivaroxaban TAKE 1 TABLET(20 MG) BY MOUTH DAILY WITH SUPPER          Yates Decamp, MD, [facility] 04/16/2023, 11:18 AM Office: [phone] Fax: [phone] Pager: [phone]

## 2023-04-16 NOTE — Anesthesia Preprocedure Evaluation (Signed)
Anesthesia Evaluation  Patient identified by MRN, date of birth, ID band Patient awake    Reviewed: Allergy & Precautions, NPO status , Patient's Chart, lab work & pertinent test results, reviewed documented beta blocker date and time   History of Anesthesia Complications (+) PONV and history of anesthetic complications  Airway Mallampati: III  TM Distance: >3 FB Neck ROM: Full    Dental  (+) Teeth Intact, Dental Advisory Given, Chipped,    Pulmonary neg pulmonary ROS   Pulmonary exam normal breath sounds clear to auscultation       Cardiovascular hypertension, Pt. on home beta blockers + Peripheral Vascular Disease  + dysrhythmias Atrial Fibrillation  Rhythm:Irregular Rate:Abnormal     Neuro/Psych  Headaches PSYCHIATRIC DISORDERS Anxiety Depression       GI/Hepatic Neg liver ROS,GERD  ,,  Endo/Other  diabetes, Type 2, Oral Hypoglycemic Agents    Renal/GU negative Renal ROS     Musculoskeletal negative musculoskeletal ROS (+)    Abdominal   Peds  Hematology  (+) Blood dyscrasia (Xarelto)   Anesthesia Other Findings Day of surgery medications reviewed with the patient.  Reproductive/Obstetrics                              Anesthesia Physical Anesthesia Plan  ASA: 3  Anesthesia Plan: General   Post-op Pain Management:    Induction: Intravenous  PONV Risk Score and Plan: 3 and TIVA and Treatment may vary due to age or medical condition  Airway Management Planned: Mask  Additional Equipment:   Intra-op Plan:   Post-operative Plan:   Informed Consent: I have reviewed the patients History and Physical, chart, labs and discussed the procedure including the risks, benefits and alternatives for the proposed anesthesia with the patient or authorized representative who has indicated his/her understanding and acceptance.     Dental advisory given  Plan Discussed with:  CRNA  Anesthesia Plan Comments:          Anesthesia Quick Evaluation

## 2023-04-16 NOTE — Anesthesia Procedure Notes (Signed)
Procedure Name: General with mask airway Date/Time: 04/16/2023 11:09 AM  Performed by: Marena Chancy, CRNAPre-anesthesia Checklist: Timeout performed, Patient being monitored, Suction available, Emergency Drugs available and Patient identified Patient Re-evaluated:Patient Re-evaluated prior to induction Oxygen Delivery Method: Simple face mask Preoxygenation: Pre-oxygenation with 100% oxygen Induction Type: IV induction

## 2023-04-16 NOTE — Discharge Instructions (Signed)

## 2023-04-16 NOTE — Anesthesia Postprocedure Evaluation (Signed)
Anesthesia Post Note  Patient: Joseph [name]  Procedure(s) Performed: CARDIOVERSION     Patient location during evaluation: Cath Lab Anesthesia Type: General Level of consciousness: awake and alert Pain management: pain level controlled Vital Signs Assessment: post-procedure vital signs reviewed and stable Respiratory status: spontaneous breathing, nonlabored ventilation, respiratory function stable and patient connected to nasal cannula oxygen Cardiovascular status: blood pressure returned to baseline and stable Postop Assessment: no apparent nausea or vomiting Anesthetic complications: no   No notable events documented.  Last Vitals:  Vitals:   04/16/23 1130 04/16/23 1140  BP: 116/67 122/71  Pulse: (!) 51 (!) 53  Resp: 20 19  Temp:    SpO2: 94% 93%    Last Pain:  Vitals:   04/16/23 1140  TempSrc:   PainSc: 0-No pain                 Collene Schlichter

## 2023-04-16 NOTE — Interval H&P Note (Signed)
History and Physical Interval Note:  04/16/2023 11:06 AM  Joseph [name]  has presented today for surgery, with the diagnosis of AFIB.  The various methods of treatment have been discussed with the patient and family. After consideration of risks, benefits and other options for treatment, the patient has consented to  Procedure(s): CARDIOVERSION (N/A) as a surgical intervention.  The patient's history has been reviewed, patient examined, no change in status, stable for surgery.  I have reviewed the patient's chart and labs.  Questions were answered to the patient's satisfaction.     Yates Decamp

## 2023-04-16 NOTE — Transfer of Care (Signed)
Immediate Anesthesia Transfer of Care Note  Patient: Joseph [name]  Procedure(s) Performed: CARDIOVERSION  Patient Location: Cath Lab  Anesthesia Type:General  Level of Consciousness: awake, alert , and oriented  Airway & Oxygen Therapy: Patient Spontanous Breathing  Post-op Assessment: Report given to RN and Post -op Vital signs reviewed and stable  Post vital signs: Reviewed and stable  Last Vitals:  Vitals Value Taken Time  BP    Temp    Pulse    Resp    SpO2      Last Pain:  Vitals:   04/16/23 1024  TempSrc: Temporal         Complications: No notable events documented.

## 2023-04-17 NOTE — Progress Notes (Signed)
EKG 04/16/2023: Sinus pericardia with first-degree AV block at the rate of 54 bpm, left axis deviation, left anterior fascicular block.  Incomplete right bundle branch block.  Poor R progression, cannot exclude anteroseptal infarct old.  Low-voltage complexes.  Pulmonary disease pattern.

## 2023-04-21 ENCOUNTER — Ambulatory Visit: Payer: Medicare PPO | Admitting: Cardiology

## 2023-04-22 DIAGNOSIS — G4733 Obstructive sleep apnea (adult) (pediatric): Secondary | ICD-10-CM | POA: Diagnosis not present

## 2023-04-29 ENCOUNTER — Encounter (HOSPITAL_COMMUNITY): Payer: Self-pay

## 2023-04-29 ENCOUNTER — Encounter: Payer: Self-pay | Admitting: Cardiology

## 2023-04-29 ENCOUNTER — Ambulatory Visit: Payer: Medicare PPO | Admitting: Cardiology

## 2023-04-29 VITALS — BP 135/71 | HR 58 | Resp 14 | Ht 69.0 in | Wt 326.0 lb

## 2023-04-29 DIAGNOSIS — N1831 Chronic kidney disease, stage 3a: Secondary | ICD-10-CM | POA: Diagnosis not present

## 2023-04-29 DIAGNOSIS — I10 Essential (primary) hypertension: Secondary | ICD-10-CM

## 2023-04-29 DIAGNOSIS — I129 Hypertensive chronic kidney disease with stage 1 through stage 4 chronic kidney disease, or unspecified chronic kidney disease: Secondary | ICD-10-CM | POA: Diagnosis not present

## 2023-04-29 DIAGNOSIS — I5032 Chronic diastolic (congestive) heart failure: Secondary | ICD-10-CM

## 2023-04-29 DIAGNOSIS — I4819 Other persistent atrial fibrillation: Secondary | ICD-10-CM

## 2023-04-29 DIAGNOSIS — I480 Paroxysmal atrial fibrillation: Secondary | ICD-10-CM | POA: Diagnosis not present

## 2023-04-29 MED ORDER — FUROSEMIDE 40 MG PO TABS
40.0000 mg | ORAL_TABLET | Freq: Every day | ORAL | 1 refills | Status: AC | PRN
Start: 2023-04-29 — End: ?

## 2023-04-29 NOTE — Progress Notes (Signed)
Primary Physician/Referring:  Merri Brunette, MD  Patient ID: Joseph [name], male    DOB: [birth year], 82 y.o.   MRN: [id number]  No chief complaint on file.  HPI:    Joseph [name]  is a 82 y.o. Caucasian male patient with paroxysmal atrial fibrillation SP atrial fibrillation ablation on 2013, ascending thoracic aortic aneurysm being followed by Dr. Cleon Dew stable at around 5 cm since 2017 or longer, primary hypertension, hypercholesterolemia, morbid obesity, negative sleep study in the past, chronic diastolic heart failure underwent direct-current cardioversion on 04/16/2023 and presents for follow-up.   Patient felt well for about 1 week after cardioversion as he maintained sinus rhythm, then felt that he was back into atrial fibrillation.  He has noticed that his less energetic, has dyspnea.  But denies PND or orthopnea, denies weight gain, denies leg edema.  He has been extremely careful with his diet.  He continues to lose weight.   Past Medical History:  Diagnosis Date   Allergy    Atrial fibrillation (HCC) 11/2008   Atypical atrial flutter (HCC) 07/13/2014   Colon polyp    Depression    Diverticulosis    Dysrhythmia    GERD (gastroesophageal reflux disease)    Headache(784.0)    Hyperlipidemia    Hypertension    Panic attacks    PONV (postoperative nausea and vomiting)    Thoracic aneurysm    Past Surgical History:  Procedure Laterality Date   ablataion  07/06/2012   APPENDECTOMY  1958   BIOPSY  12/03/2022   Procedure: BIOPSY;  Surgeon: Meryl Dare, MD;  Location: [facility];  Service: Gastroenterology;;   CARDIAC CATHETERIZATION  2009   CARDIOVERSION  03/18/2012   Procedure: CARDIOVERSION;  Surgeon: Lesleigh Noe, MD;  Location: [facility] OR;  Service: Cardiovascular;  Laterality: N/A;   CARDIOVERSION N/A 08/23/2014   Procedure: CARDIOVERSION;  Surgeon: Lesleigh Noe, MD;  Location: [facility] ENDOSCOPY;  Service: Cardiovascular;  Laterality: N/A;   CARDIOVERSION  N/A 04/16/2023   Procedure: CARDIOVERSION;  Surgeon: Yates Decamp, MD;  Location: [facility] INVASIVE CV LAB;  Service: Cardiovascular;  Laterality: N/A;   COLONOSCOPY WITH PROPOFOL N/A 12/03/2022   Procedure: COLONOSCOPY WITH PROPOFOL;  Surgeon: Meryl Dare, MD;  Location: [facility] ENDOSCOPY;  Service: Gastroenterology;  Laterality: N/A;   ESOPHAGOGASTRODUODENOSCOPY (EGD) WITH PROPOFOL N/A 12/03/2022   Procedure: ESOPHAGOGASTRODUODENOSCOPY (EGD) WITH PROPOFOL;  Surgeon: Meryl Dare, MD;  Location: [facility] ENDOSCOPY;  Service: Gastroenterology;  Laterality: N/A;   INGUINAL HERNIA REPAIR     right   LUMBAR LAMINECTOMY/DECOMPRESSION MICRODISCECTOMY Bilateral 02/08/2015   Procedure: Laminectomy and Foraminotomy - bilateral - Lumbar two-three, three-four, left lumbar four-five with resection of synovial cyst ;  Surgeon: Temple Pacini, MD;  Location: [facility] NEURO ORS;  Service: Neurosurgery;  Laterality: Bilateral;   POLYPECTOMY  12/03/2022   Procedure: POLYPECTOMY;  Surgeon: Meryl Dare, MD;  Location: [facility];  Service: Gastroenterology;;   spinal tap     VASECTOMY     Family History  Problem Relation Age of Onset   Heart disease Mother 63       mother   Heart attack Mother    Lung cancer Father    Heart disease Father    Kidney disease Brother 58       died age 38 heart attack   Heart attack Brother    Heart disease Maternal Grandfather    Colon cancer Neg Hx     Social History  Tobacco Use   Smoking status: Never   Smokeless tobacco: Never   Tobacco comments:    Never smoke 11/23/22  Substance Use Topics   Alcohol use: No   Marital Status: Married  ROS  Review of Systems  Constitutional: Positive for malaise/fatigue.  Cardiovascular:  Positive for dyspnea on exertion and leg swelling. Negative for chest pain.   Objective      04/29/2023   11:30 AM 04/16/2023   11:40 AM 04/16/2023   11:30 AM  Vitals with BMI  Height 5\' 9"     Weight 326 lbs    BMI 48.12    Systolic 135 122 409   Diastolic 71 71 67  Pulse 58 53 51   SpO2: 92 %  Physical Exam Constitutional:      Appearance: He is morbidly obese.  Neck:     Vascular: No carotid bruit or JVD (Short neck).  Cardiovascular:     Rate and Rhythm: Normal rate. Rhythm irregular.     Pulses:          Dorsalis pedis pulses are 0 on the right side and 0 on the left side.       Posterior tibial pulses are 0 on the right side and 0 on the left side.     Heart sounds: No murmur heard.    Comments: Difficult to palpate his popliteal and femoral arteries due to body habitus Pulmonary:     Effort: Pulmonary effort is normal.     Breath sounds: Normal breath sounds.  Abdominal:     General: Abdomen is protuberant. Bowel sounds are normal.     Palpations: Abdomen is soft.  Musculoskeletal:     Right lower leg: Edema (Trace) present.     Left lower leg: Edema (Trace) present.  Skin:    Capillary Refill: Capillary refill takes less than 2 seconds.    Laboratory examination:   Recent Labs    02/26/23 1137 03/18/23 1327 03/25/23 1048  NA 140 139 134  K 4.4 4.2 4.5  CL 102 103 97  CO2 19* 22 18*  GLUCOSE 110* 96 101*  BUN 19 21 22   CREATININE 1.35* 1.34* 1.39*  CALCIUM 9.3 9.1 9.6   Lab Results  Component Value Date   GLUCOSE 101 (H) 03/25/2023   NA 134 03/25/2023   K 4.5 03/25/2023   CL 97 03/25/2023   CO2 18 (L) 03/25/2023   BUN 22 03/25/2023   CREATININE 1.39 (H) 03/25/2023   EGFR 51 (L) 03/25/2023   CALCIUM 9.6 03/25/2023   PROT 6.9 11/01/2019   ALBUMIN 4.7 11/01/2019   BILITOT 0.4 11/01/2019   ALKPHOS 66 11/01/2019   AST 21 11/01/2019   ALT 20 11/01/2019   ANIONGAP 6 01/31/2015      TSH Recent Labs    10/12/22 1407  TSH 2.890   ProBNP (last 3 results) Recent Labs    10/12/22 1407 03/18/23 1327 03/25/23 1052  PROBNP 913* 1,150* 1,124*    External labs:   Labs 03/03/2023:  Serum glucose 97 mg, BUN 19, creatinine 1.38, EGFR 50 to mL, potassium 4.2.  LFTs normal.  Iron studies  reveal mild decrease in iron.  Labs 12/29/2022:  Hb 10.1/HCT 32.9, platelets 346, microcytic indicis.  Cholesterol, total 129.000 m 07/23/2022 HDL 62.000 mg 07/23/2022 LDL 51.000 mg 07/23/2022 Triglycerides 76.000 mg 07/23/2022  Radiology:   CT angiogram chest 01/26/2023: 1. The previous nodular ground-glass opacities have nearly resolved compatible with resolving infection. 2. Cardiomegaly. New pulmonary edema since  10/21/2022. Trace right pleural effusion. 3. Unchanged 49 mm ascending aortic aneurysm. Ascending thoracic aortic aneurysm. Recommend semi-annual imaging followup by CTA or MRA and referral to cardiothoracic surgery if not already obtained. Aortic aneurysm stable since 2017.  Chest x-ray two-view 02/28/2023: Cardiac silhouette is prominent. There is pulmonary interstitial prominence with vascular congestion. No focal consolidation. No pneumothorax or pleural effusion identified. There are thoracic degenerative changes. IMPRESSION: Findings suggest CHF.  PFTs 02/02/2023: Moderately severe restrictive defect with mild reduction in DLCO  Cardiac Studies:   Left Heart Catheterization 12/13/2008:  1. Hemodynamic data:      a.     Aortic pressure 126/76.      b.     Left ventricular pressure 128/40 mmHg.  2. Left ventriculography:  LV cavity size and function are normal.  EF is 60%.  No obvious mitral regurgitation is noted.  3. Coronary angiography.      a.     Left main coronary artery:  Normal.      b.     Left anterior descending coronary artery:  Normal.  The vessel gives origin to 2 diagonal branches also normal.  The LAD is transapical.      c.     Circumflex artery:  The circumflex coronary artery gives origin to 3 marginals, they are normal.      d.     Right coronary artery:  The right coronary artery is normal. The PDA is large and normal.  Echocardiogram 02/24/2023: 1. Left ventricular ejection fraction, by estimation, is 60 to 65%. The left ventricle has normal  function. The left ventricle has no regional wall motion abnormalities. There is mild left ventricular hypertrophy. Left ventricular diastolic parameters  were normal.  2. Right ventricular systolic function is normal. The right ventricular size is normal. There is moderately elevated pulmonary artery systolic pressure. The estimated right ventricular systolic pressure is 59.2 mmHg.  3. Left atrial size was mildly dilated.  4. Right atrial size was mildly dilated.  5. The mitral valve is normal in structure. No evidence of mitral valve regurgitation. No evidence of mitral stenosis.  6. The aortic valve is normal in structure. Aortic valve regurgitation is not visualized. No aortic stenosis is present.  7. There is severe dilatation of the ascending aorta, measuring 50 mm.  8. The inferior vena cava is dilated in size with <50% respiratory variability, suggesting right atrial pressure of 15 mmHg.  Direct current cardioversion 04/16/2023 11:18 AM   Indication symptomatic A. Fibrillation.   Procedure: Using 60 mg of IV Propofol and 60 IV Lidocaine (for reducing venous pain) for achieving deep sedation, synchronized direct current cardioversion performed. Patient was delivered with 150 Joules of electricity X 1 with success to NSR. Patient tolerated the procedure well. No immediate complication noted.   EKG:   EKG 04/29/2023: Atrial fibrillation with controlled ventricular response at the rate of 67 bpm, incomplete right bundle branch block.  Poor R progression, cannot exclude anterolateral infarct old.  Low-voltage complexes.  Pulmonary disease pattern.  Compared to 03/30/2023, atypical atrial flutter is now atrial fibrillation.  Compared to 11/23/2022, sinus rhythm with first-degree AV block has been replaced.  Medications and allergies   Allergies  Allergen Reactions   Amiodarone Other (See Comments)    Pulmonary toxicity   Eszopiclone     Other Reaction(s): not effective    Medication list     Current Outpatient Medications:    acetaminophen (TYLENOL) 500 MG tablet, Take 500 mg by mouth  every 6 (six) hours as needed for headache, moderate pain or mild pain (pain)., Disp: , Rfl:    albuterol (VENTOLIN HFA) 108 (90 Base) MCG/ACT inhaler, Inhale 2 puffs into the lungs every 6 (six) hours as needed for wheezing or shortness of breath., Disp: 8 g, Rfl: 3   ALPRAZolam (XANAX) 0.5 MG tablet, Take 0.25-0.5 mg by mouth 3 (three) times daily as needed (ear ringing.)., Disp: , Rfl:    Azelaic Acid (FINACEA) 15 % gel, Apply 1 Application topically 2 (two) times daily as needed (Face)., Disp: , Rfl:    dapagliflozin propanediol (FARXIGA) 10 MG TABS tablet, Take 1 tablet (10 mg total) by mouth daily before breakfast., Disp: 90 tablet, Rfl: 1   finasteride (PROSCAR) 5 MG tablet, Take 5 mg by mouth daily., Disp: , Rfl:    metoprolol succinate (TOPROL-XL) 100 MG 24 hr tablet, Take 100 mg by mouth in the morning. Take with or immediately following a meal., Disp: , Rfl:    olmesartan (BENICAR) 40 MG tablet, Take 40 mg by mouth daily., Disp: , Rfl:    pantoprazole (PROTONIX) 40 MG tablet, Take 40 mg by mouth in the morning., Disp: , Rfl: 0   potassium chloride SA (KLOR-CON M) 20 MEQ tablet, Take 1 tablet (20 mEq total) by mouth in the morning., Disp: 90 tablet, Rfl: 1   rosuvastatin (CRESTOR) 20 MG tablet, TAKE 1 TABLET(20 MG) BY MOUTH DAILY, Disp: 90 tablet, Rfl: 1   sertraline (ZOLOFT) 100 MG tablet, Take 100 mg by mouth in the morning., Disp: , Rfl: 0   tamsulosin (FLOMAX) 0.4 MG CAPS capsule, Take 0.4 mg by mouth daily after breakfast., Disp: , Rfl:    triamcinolone cream (KENALOG) 0.1 %, Apply 1 Application topically 2 (two) times daily as needed (Rash)., Disp: , Rfl:    XARELTO 20 MG TABS tablet, TAKE 1 TABLET(20 MG) BY MOUTH DAILY WITH SUPPER, Disp: 90 tablet, Rfl: 1   furosemide (LASIX) 40 MG tablet, Take 1 tablet (40 mg total) by mouth daily as needed for fluid., Disp: 90 tablet, Rfl: 1    Assessment     ICD-10-CM   1. Persistent atrial fibrillation (HCC)  I48.19 EKG 12-Lead    2. Chronic diastolic (congestive) heart failure (HCC)  I50.32 furosemide (LASIX) 40 MG tablet    Basic metabolic panel    Pro b natriuretic peptide (BNP)    Magnesium    3. Essential hypertension  I10     4. Stage 3a chronic kidney disease (HCC)  N18.31        Orders Placed This Encounter  Procedures   Basic metabolic panel   Pro b natriuretic peptide (BNP)   Magnesium   EKG 12-Lead    Meds ordered this encounter  Medications   furosemide (LASIX) 40 MG tablet    Sig: Take 1 tablet (40 mg total) by mouth daily as needed for fluid.    Dispense:  90 tablet    Refill:  1    Medications Discontinued During This Encounter  Medication Reason   Finerenone (KERENDIA) 10 MG TABS    furosemide (LASIX) 40 MG tablet Reorder     Recommendations:   Joseph [name] is a 82 y.o. Caucasian male patient with paroxysmal atrial fibrillation SP atrial fibrillation ablation on 2013, ascending thoracic aortic aneurysm being followed by Dr. Cleon Dew stable at around 5 cm since 2017 or longer, primary hypertension, hypercholesterolemia, morbid obesity, negative sleep study in the past, chronic diastolic heart failure underwent  direct-current cardioversion on 04/16/2023 and presents for follow-up.   Amiodarone was discontinued by me due to suspicion for pulmonary toxicity.  1. Persistent atrial fibrillation [facility]) Patient felt well for about 1 week after cardioversion as he maintained sinus rhythm, then felt that he was back into atrial fibrillation.  Today EKG confirms atrial fibrillation.  He has again developed shortness of breath but fortunately has been extremely strict with his diet and continues to lose weight and he has not had any further leg edema.  Extensive discussion with the patient regarding antiarrhythmic therapy use, I do not think we want to start amiodarone and take a chance of  pulmonary toxicity, his best option would be Tikosyn.  Multaq would be less effective.  In view of renal insufficiency, sotalol dose adjustment may be difficult as well.  After pros and cons, patient would like to try Tikosyn.  I will make arrangement for elective admission to the hospital for therapeutic drug monitoring.  Patient is aware that he may need cardioversion if he does not spontaneously convert to sinus rhythm.  - EKG 12-Lead  2. Chronic diastolic (congestive) heart failure (HCC) Patient acute diastolic heart failure has resolved.  Advised him to change his furosemide from 40 mg daily to as needed use.  - furosemide (LASIX) 40 MG tablet; Take 1 tablet (40 mg total) by mouth daily as needed for fluid.  Dispense: 90 tablet; Refill: 1 - Basic metabolic panel - Pro b natriuretic peptide (BNP) - Magnesium  3. Essential hypertension Blood pressure is now well-controlled.  4. Stage 3a chronic kidney disease (HCC) Stage IIIa chronic kidney disease has remained stable.  He is tolerating [place] without any complications.  Continue 10 mg dose. So far his weight is down from 362 pounds to 326 pounds, I congratulated him.    Yates Decamp, MD, [facility] 04/29/2023, 12:26 PM Office: [phone]

## 2023-04-30 DIAGNOSIS — I5032 Chronic diastolic (congestive) heart failure: Secondary | ICD-10-CM | POA: Diagnosis not present

## 2023-05-01 LAB — BASIC METABOLIC PANEL
BUN/Creatinine Ratio: 15 (ref 10–24)
BUN: 18 mg/dL (ref 8–27)
CO2: 18 mmol/L — ABNORMAL LOW (ref 20–29)
Calcium: 9.1 mg/dL (ref 8.6–10.2)
Chloride: 100 mmol/L (ref 96–106)
Creatinine, Ser: 1.22 mg/dL (ref 0.76–1.27)
Glucose: 112 mg/dL — ABNORMAL HIGH (ref 70–99)
Potassium: 4 mmol/L (ref 3.5–5.2)
Sodium: 134 mmol/L (ref 134–144)
eGFR: 60 mL/min/{1.73_m2} (ref >59)

## 2023-05-01 LAB — PRO B NATRIURETIC PEPTIDE: NT-Pro BNP: 1236 pg/mL — ABNORMAL HIGH (ref 0–486)

## 2023-05-01 LAB — MAGNESIUM: Magnesium: 2.3 mg/dL (ref 1.6–2.3)

## 2023-05-02 ENCOUNTER — Inpatient Hospital Stay (HOSPITAL_COMMUNITY)
Admit: 2023-05-02 | Discharge: 2023-05-05 | DRG: 309 | Disposition: A | Discharge status: Home / Self Care | Payer: Medicare PPO | Attending: Cardiology | Admitting: Cardiology

## 2023-05-02 DIAGNOSIS — I110 Hypertensive heart disease with heart failure: Secondary | ICD-10-CM | POA: Diagnosis not present

## 2023-05-02 DIAGNOSIS — I10 Essential (primary) hypertension: Secondary | ICD-10-CM

## 2023-05-02 DIAGNOSIS — E6609 Other obesity due to excess calories: Secondary | ICD-10-CM | POA: Diagnosis not present

## 2023-05-02 DIAGNOSIS — Z8249 Family history of ischemic heart disease and other diseases of the circulatory system: Secondary | ICD-10-CM

## 2023-05-02 DIAGNOSIS — I4819 Other persistent atrial fibrillation: Principal | ICD-10-CM | POA: Diagnosis present

## 2023-05-02 DIAGNOSIS — Z841 Family history of disorders of kidney and ureter: Secondary | ICD-10-CM | POA: Diagnosis not present

## 2023-05-02 DIAGNOSIS — Z7901 Long term (current) use of anticoagulants: Secondary | ICD-10-CM

## 2023-05-02 DIAGNOSIS — K219 Gastro-esophageal reflux disease without esophagitis: Secondary | ICD-10-CM | POA: Diagnosis present

## 2023-05-02 DIAGNOSIS — I440 Atrioventricular block, first degree: Secondary | ICD-10-CM | POA: Diagnosis present

## 2023-05-02 DIAGNOSIS — F419 Anxiety disorder, unspecified: Secondary | ICD-10-CM | POA: Diagnosis present

## 2023-05-02 DIAGNOSIS — I7121 Aneurysm of the ascending aorta, without rupture: Secondary | ICD-10-CM | POA: Diagnosis present

## 2023-05-02 DIAGNOSIS — Z6841 Body Mass Index (BMI) 40.0 and over, adult: Secondary | ICD-10-CM | POA: Diagnosis not present

## 2023-05-02 DIAGNOSIS — E782 Mixed hyperlipidemia: Secondary | ICD-10-CM | POA: Diagnosis not present

## 2023-05-02 DIAGNOSIS — I480 Paroxysmal atrial fibrillation: Principal | ICD-10-CM | POA: Diagnosis present

## 2023-05-02 DIAGNOSIS — Z5181 Encounter for therapeutic drug level monitoring: Secondary | ICD-10-CM

## 2023-05-02 DIAGNOSIS — I5032 Chronic diastolic (congestive) heart failure: Secondary | ICD-10-CM

## 2023-05-02 DIAGNOSIS — Z888 Allergy status to other drugs, medicaments and biological substances status: Secondary | ICD-10-CM

## 2023-05-02 DIAGNOSIS — Z79899 Other long term (current) drug therapy: Secondary | ICD-10-CM | POA: Diagnosis not present

## 2023-05-02 DIAGNOSIS — E7800 Pure hypercholesterolemia, unspecified: Secondary | ICD-10-CM | POA: Diagnosis not present

## 2023-05-02 DIAGNOSIS — F32A Depression, unspecified: Secondary | ICD-10-CM | POA: Diagnosis present

## 2023-05-02 DIAGNOSIS — I484 Atypical atrial flutter: Secondary | ICD-10-CM | POA: Diagnosis present

## 2023-05-02 DIAGNOSIS — Z801 Family history of malignant neoplasm of trachea, bronchus and lung: Secondary | ICD-10-CM | POA: Diagnosis not present

## 2023-05-02 DIAGNOSIS — E669 Obesity, unspecified: Secondary | ICD-10-CM | POA: Diagnosis not present

## 2023-05-02 LAB — BASIC METABOLIC PANEL
Anion gap: 11 (ref 5–15)
BUN: 20 mg/dL (ref 8–23)
CO2: 20 mmol/L — ABNORMAL LOW (ref 22–32)
Calcium: 9 mg/dL (ref 8.9–10.3)
Chloride: 102 mmol/L (ref 98–111)
Creatinine, Ser: 1.44 mg/dL — ABNORMAL HIGH (ref 0.61–1.24)
GFR, Estimated: 49 mL/min — ABNORMAL LOW (ref >60)
Glucose, Bld: 103 mg/dL — ABNORMAL HIGH (ref 70–99)
Potassium: 3.4 mmol/L — ABNORMAL LOW (ref 3.5–5.1)
Sodium: 133 mmol/L — ABNORMAL LOW (ref 135–145)

## 2023-05-02 LAB — MAGNESIUM: Magnesium: 2.2 mg/dL (ref 1.7–2.4)

## 2023-05-02 MED ORDER — ALBUTEROL SULFATE (2.5 MG/3ML) 0.083% IN NEBU: 2.5000 mg | INHALATION_SOLUTION | Freq: Four times a day (QID) | RESPIRATORY_TRACT | Status: DC | PRN

## 2023-05-02 MED ORDER — SODIUM CHLORIDE 0.9% FLUSH
3.0000 mL | Freq: Two times a day (BID) | INTRAVENOUS | Status: DC
  Administered 2023-05-03: 3 mL via INTRAVENOUS

## 2023-05-02 MED ORDER — SERTRALINE HCL 100 MG PO TABS
100.0000 mg | ORAL_TABLET | Freq: Every day | ORAL | Status: DC
  Administered 2023-05-03 – 2023-05-05 (×3): 100 mg via ORAL
  Filled 2023-05-02 (×3): qty 1

## 2023-05-02 MED ORDER — POTASSIUM CHLORIDE CRYS ER 20 MEQ PO TBCR: 20.0000 meq | EXTENDED_RELEASE_TABLET | Freq: Every day | ORAL | Status: DC

## 2023-05-02 MED ORDER — ACETAMINOPHEN 500 MG PO TABS: 500.0000 mg | ORAL_TABLET | Freq: Four times a day (QID) | ORAL | Status: DC | PRN

## 2023-05-02 MED ORDER — TAMSULOSIN HCL 0.4 MG PO CAPS
0.4000 mg | ORAL_CAPSULE | Freq: Every day | ORAL | Status: DC
  Administered 2023-05-03 – 2023-05-05 (×3): 0.4 mg via ORAL
  Filled 2023-05-02 (×3): qty 1

## 2023-05-02 MED ORDER — FUROSEMIDE 40 MG PO TABS: 40.0000 mg | ORAL_TABLET | Freq: Every day | ORAL | Status: DC | PRN

## 2023-05-02 MED ORDER — METOPROLOL SUCCINATE ER 100 MG PO TB24
100.0000 mg | ORAL_TABLET | Freq: Every day | ORAL | Status: DC
  Administered 2023-05-03 – 2023-05-05 (×3): 100 mg via ORAL
  Filled 2023-05-02 (×3): qty 1

## 2023-05-02 MED ORDER — ROSUVASTATIN CALCIUM 5 MG PO TABS
10.0000 mg | ORAL_TABLET | Freq: Every day | ORAL | Status: DC
  Administered 2023-05-03 – 2023-05-05 (×3): 10 mg via ORAL
  Filled 2023-05-02 (×3): qty 2

## 2023-05-02 MED ORDER — ALBUTEROL SULFATE HFA 108 (90 BASE) MCG/ACT IN AERS: 2.0000 | INHALATION_SPRAY | Freq: Four times a day (QID) | RESPIRATORY_TRACT | Status: DC | PRN

## 2023-05-02 MED ORDER — FINASTERIDE 5 MG PO TABS
5.0000 mg | ORAL_TABLET | Freq: Every day | ORAL | Status: DC
  Administered 2023-05-03 – 2023-05-05 (×3): 5 mg via ORAL
  Filled 2023-05-02 (×3): qty 1

## 2023-05-02 MED ORDER — ALPRAZOLAM 0.25 MG PO TABS: 0.2500 mg | ORAL_TABLET | Freq: Three times a day (TID) | ORAL | Status: DC | PRN

## 2023-05-02 MED ORDER — POTASSIUM CHLORIDE 10 MEQ/100ML IV SOLN
10.0000 meq | INTRAVENOUS | Status: AC
  Administered 2023-05-02 – 2023-05-03 (×4): 10 meq via INTRAVENOUS
  Filled 2023-05-02 (×4): qty 100

## 2023-05-02 MED ORDER — RIVAROXABAN 20 MG PO TABS
20.0000 mg | ORAL_TABLET | Freq: Every day | ORAL | Status: DC
  Administered 2023-05-03 – 2023-05-04 (×2): 20 mg via ORAL
  Filled 2023-05-02 (×2): qty 1

## 2023-05-02 MED ORDER — IRBESARTAN 150 MG PO TABS
300.0000 mg | ORAL_TABLET | Freq: Every day | ORAL | Status: DC
  Administered 2023-05-03 – 2023-05-05 (×3): 300 mg via ORAL
  Filled 2023-05-02 (×3): qty 2

## 2023-05-02 MED ORDER — DAPAGLIFLOZIN PROPANEDIOL 10 MG PO TABS
10.0000 mg | ORAL_TABLET | Freq: Every day | ORAL | Status: DC
  Administered 2023-05-03 – 2023-05-05 (×3): 10 mg via ORAL
  Filled 2023-05-02 (×4): qty 1

## 2023-05-02 MED ORDER — DOFETILIDE 500 MCG PO CAPS: 500.0000 ug | ORAL_CAPSULE | Freq: Two times a day (BID) | ORAL | Status: DC

## 2023-05-02 MED ORDER — PANTOPRAZOLE SODIUM 40 MG PO TBEC
40.0000 mg | DELAYED_RELEASE_TABLET | Freq: Every day | ORAL | Status: DC
  Administered 2023-05-03 – 2023-05-05 (×3): 40 mg via ORAL
  Filled 2023-05-02 (×3): qty 1

## 2023-05-02 MED ORDER — AZELAIC ACID 15 % EX GEL: 1.0000 | Freq: Two times a day (BID) | CUTANEOUS | Status: DC | PRN

## 2023-05-02 MED ORDER — SODIUM CHLORIDE 0.9% FLUSH: 3.0000 mL | INTRAVENOUS | Status: DC | PRN

## 2023-05-02 MED ORDER — SODIUM CHLORIDE 0.9 % IV SOLN: 250.0000 mL | INTRAVENOUS | Status: DC | PRN

## 2023-05-02 NOTE — Progress Notes (Signed)
Pharmacy Review for Dofetilide (Tikosyn) Initiation  Admit Complaint: 82 y.o. male admitted 05/02/2023 with atrial fibrillation to be initiated on dofetilide.   Assessment:  Patient Exclusion Criteria: If any screening criteria checked as "Yes", then  patient  should NOT receive dofetilide until criteria item is corrected. If "Yes" please indicate correction plan.  YES  NO Patient  Exclusion Criteria Correction Plan  [x]  []  Baseline QTc interval is greater than or equal to 440 msec. IF above YES box checked dofetilide contraindicated unless patient has ICD; then may proceed if QTc 500-550 msec or with known ventricular conduction abnormalities may proceed with QTc 550-600 msec. QTc = 474 ms  Repeat EKG after K correction, prior to giving Tikosyn  []  [x]  Magnesium level is less than 1.8 mEq/l : Last magnesium:  Lab Results  Component Value Date   MG 2.2 05/02/2023         [x]  []  Potassium level is less than 4 mEq/l : Last potassium:  Lab Results  Component Value Date   K 3.4 (L) 05/02/2023        KCL x 4 runs per MD  []  [x]  Patient is known or suspected to have a digoxin level greater than 2 ng/ml: No results found for: "DIGOXIN"    []  [x]  Creatinine clearance less than 20 ml/min (calculated using Cockcroft-Gault, actual body weight and serum creatinine): Estimated Creatinine Clearance: 57.7 mL/min (A) (by C-G formula based on SCr of 1.44 mg/dL (H)).    []  [x]  Patient has received drugs known to prolong the QT intervals within the last 48 hours (phenothiazines, tricyclics or tetracyclic antidepressants, erythromycin, H-1 antihistamines, cisapride, fluoroquinolones, azithromycin). Drugs not listed above may have an, as yet, undetected potential to prolong the QT interval, updated information on QT prolonging agents is available at this website:QT prolonging agents   []  [x]  Patient received a dose of hydrochlorothiazide (Oretic) alone or in any combination including triamterene  (Dyazide, Maxzide) in the last 48 hours.   []  [x]  Patient received a medication known to increase dofetilide plasma concentrations prior to initial dofetilide dose:  Trimethoprim (Primsol, Proloprim) in the last 36 hours Verapamil (Calan, Verelan) in the last 36 hours or a sustained release dose in the last 72 hours Megestrol (Megace) in the last 5 days  Cimetidine (Tagamet) in the last 6 hours Ketoconazole (Nizoral) in the last 24 hours Itraconazole (Sporanox) in the last 48 hours  Prochlorperazine (Compazine) in the last 36 hours    []  [x]  Patient is known to have a history of torsades de pointes; congenital or acquired long QT syndromes.   []  [x]  Patient has received a Class 1 antiarrhythmic with less than 2 half-lives since last dose. (Disopyramide, Quinidine, Procainamide, Lidocaine, Mexiletine, Flecainide, Propafenone)   []  [x]  Patient has received amiodarone therapy in the past 3 months or amiodarone level is greater than 0.3 ng/ml.    Patient has been appropriately anticoagulated with Xarelto (no missed doses per patient/wife).  Ordering provider was confirmed at [email or web address] if they are not listed on the [facility] Authorized Prescribers list.  Goal of Therapy: Follow renal function, electrolytes, potential drug interactions, and dose adjustment. Provide education and 1 week supply at discharge.  Plan:  [x]   Physician selected initial dose within range recommended for patients level of renal function - will monitor for response.  []   Physician selected initial dose outside of range recommended for patients level of renal function - will discuss if the dose should be altered at this time.  Select One Calculated CrCl  Dose q12h  [x]  > 60 ml/min 500 mcg  []  40-60 ml/min 250 mcg  []  20-40 ml/min 125 mcg   2. Follow up QTc after the first 5 doses, renal function, electrolytes (K & Mg) daily x 3     days, dose adjustment, success of initiation and facilitate 1 week discharge  supply as     clinically indicated.  3. Initiate Tikosyn education video (Call [phone] and ask for Tikosyn Video # 116).  4. Place Enrollment Form on the chart for discharge supply of dofetilide.    Discussed with MDs, given low K and elevated QTc, will supplement K and delay starting Tikosyn in AM.  Margie Brink D. Laney Potash, PharmD, BCPS, BCCCP 05/02/2023, 9:08 PM

## 2023-05-02 NOTE — Progress Notes (Signed)
K 3.4, per pharmacy pt to receive 4 runs of IV K tonight, recheck labs in am. Pt will not start Tikosyn tonight per [facility]. Dierdre Highman, RN

## 2023-05-03 ENCOUNTER — Encounter (HOSPITAL_COMMUNITY): Payer: Self-pay | Admitting: Cardiology

## 2023-05-03 ENCOUNTER — Other Ambulatory Visit (HOSPITAL_COMMUNITY): Payer: Self-pay

## 2023-05-03 ENCOUNTER — Telehealth (HOSPITAL_COMMUNITY): Payer: Self-pay | Admitting: Pharmacy Technician

## 2023-05-03 ENCOUNTER — Other Ambulatory Visit: Payer: Self-pay

## 2023-05-03 LAB — BASIC METABOLIC PANEL
Anion gap: 9 (ref 5–15)
BUN: 17 mg/dL (ref 8–23)
CO2: 22 mmol/L (ref 22–32)
Calcium: 9.2 mg/dL (ref 8.9–10.3)
Chloride: 104 mmol/L (ref 98–111)
Creatinine, Ser: 1.35 mg/dL — ABNORMAL HIGH (ref 0.61–1.24)
GFR, Estimated: 53 mL/min — ABNORMAL LOW (ref >60)
Glucose, Bld: 107 mg/dL — ABNORMAL HIGH (ref 70–99)
Potassium: 3.7 mmol/L (ref 3.5–5.1)
Sodium: 135 mmol/L (ref 135–145)

## 2023-05-03 LAB — POTASSIUM: Potassium: 4.1 mmol/L (ref 3.5–5.1)

## 2023-05-03 LAB — MAGNESIUM: Magnesium: 2.2 mg/dL (ref 1.7–2.4)

## 2023-05-03 MED ORDER — POTASSIUM CHLORIDE CRYS ER 20 MEQ PO TBCR: 20.0000 meq | EXTENDED_RELEASE_TABLET | Freq: Every day | ORAL | Status: DC

## 2023-05-03 MED ORDER — POTASSIUM CHLORIDE CRYS ER 20 MEQ PO TBCR: 60.0000 meq | EXTENDED_RELEASE_TABLET | Freq: Once | ORAL | Status: DC

## 2023-05-03 MED ORDER — POTASSIUM CHLORIDE CRYS ER 20 MEQ PO TBCR
20.0000 meq | EXTENDED_RELEASE_TABLET | Freq: Every day | ORAL | Status: DC
  Administered 2023-05-03 – 2023-05-05 (×3): 20 meq via ORAL
  Filled 2023-05-03 (×3): qty 1

## 2023-05-03 MED ORDER — DOFETILIDE 500 MCG PO CAPS
500.0000 ug | ORAL_CAPSULE | Freq: Two times a day (BID) | ORAL | Status: DC
  Administered 2023-05-03 (×2): 500 ug via ORAL
  Filled 2023-05-03 (×2): qty 1

## 2023-05-03 NOTE — Progress Notes (Signed)
Am labs were drawn approx 30 min after IV potassium runs were completed. Will place order for Timed collection at [facility]

## 2023-05-03 NOTE — Care Management (Addendum)
  Transition of Care [facility]) Screening Note   Patient Details  Name: DOREAN WOLAK Date of Birth: [birth year]   Transition of Care [facility]) CM/SW Contact:    Gala Lewandowsky, RN Phone Number: 05/03/2023, 10:10 AM    Transition of Care Department [facility]) has reviewed the patient. Patient presented for Tikosyn Load. Benefits check submitted for cost. Case Manager will follow for cost and pharmacy of choice as the patient progresses.    Tikosyn Co pay: $10.00

## 2023-05-03 NOTE — Progress Notes (Addendum)
Pharmacy: Dofetilide (Tikosyn) - Follow Up Assessment and Electrolyte Replacement  Pharmacy consulted to assist in monitoring and replacing electrolytes in this 82 y.o. male admitted on 05/02/2023 undergoing dofetilide initiation. First dofetilide dose: pending  Labs:    Component Value Date/Time   K 4.1 05/03/2023 0546   MG 2.2 05/03/2023 0213    Discussed with Dr Rosemary Holms - Qtc and K okay to start initiation this morning.   Plan: Potassium: K >/= 4: No additional supplementation needed - will continue PTA Kcl 20 mEq qd   Magnesium: Mg > 2: No additional supplementation needed  Thank you for allowing pharmacy to participate in this patient's care,  Sherron Monday, PharmD, BCCCP Clinical Pharmacist  Phone: [phone] 05/03/2023 8:29 AM  Please check AMION for all [facility] Pharmacy phone numbers After 10:00 PM, call Main Pharmacy [phone]

## 2023-05-03 NOTE — TOC Benefit Eligibility Note (Signed)
Patient Advocate Encounter  Insurance verification completed.    The patient is currently admitted and upon discharge could be taking dofetilide (Tikosyn) 500 mcg capsules.  The current 30 day co-pay is $10.00.   The patient is insured through [employer] Medicare Part D   This test claim was processed through [facility]- copay amounts may vary at other pharmacies due to pharmacy/plan contracts, or as the patient moves through the different stages of their insurance plan.  Alilah Mcmeans, CPHT Pharmacy Patient Advocate Specialist [facility] Patient Advocate Team Direct Number: [phone]  Fax: [phone]       

## 2023-05-03 NOTE — Progress Notes (Addendum)
Post Tikosyn EKG completed, Pt remains in SB with 1st degree HB HR 54 and Qtc 513. Provider on call Dr Tessa Lerner paged via [facility] with above info per physician request (per Judeth Cornfield RN report handoff)   Per Dr Odis Hollingshead, dayshift to do EKG PRIOR to am dose and notify MD of Qtc

## 2023-05-03 NOTE — H&P (Signed)
Joseph [name] is an 82 y.o. male.   Chief Complaint: Atrial fibrillation HPI:   82 y.o. caucasian male  with hypertension, hyperlipidemia, obesity, stable 5 cm ascending aorta aneurysm, PAF w/h/o ablation.  Patient had ablation for Afib in 2013, cardioversion in 2015 and 03/2023, has recently been in symptomatic Afib. He is being admitted for rhythm control therapy with Tikosyn. Tikosyn first dose is still to be given pending K correction. Patient has been in sinus rhythm here.  It appears that patient, with the help of his Apple watch, can tell with reasonable accuracy when he's in sinus vs Afib. According to him, he stayed in sinus for 7 days following cardioversion on 4/21, and converted back from Afib to sinus on 5/5 evening. While in Afib, he has shortness of breath and fatigue. He has been working diligently on weight loss and has lost 38 lbs, with his goal being 100-160 lbs.    Past Medical History:  Diagnosis Date   Allergy    Atrial fibrillation (HCC) 11/2008   Atypical atrial flutter (HCC) 07/13/2014   Colon polyp    Depression    Diverticulosis    Dysrhythmia    GERD (gastroesophageal reflux disease)    Headache(784.0)    Hyperlipidemia    Hypertension    Panic attacks    PONV (postoperative nausea and vomiting)    Thoracic aneurysm     Past Surgical History:  Procedure Laterality Date   ablataion  07/06/2012   APPENDECTOMY  1958   BIOPSY  12/03/2022   Procedure: BIOPSY;  Surgeon: Meryl Dare, MD;  Location: [facility];  Service: Gastroenterology;;   CARDIAC CATHETERIZATION  2009   CARDIOVERSION  03/18/2012   Procedure: CARDIOVERSION;  Surgeon: Lesleigh Noe, MD;  Location: [facility] OR;  Service: Cardiovascular;  Laterality: N/A;   CARDIOVERSION N/A 08/23/2014   Procedure: CARDIOVERSION;  Surgeon: Lesleigh Noe, MD;  Location: [facility] ENDOSCOPY;  Service: Cardiovascular;  Laterality: N/A;   CARDIOVERSION N/A 04/16/2023   Procedure: CARDIOVERSION;  Surgeon: Yates Decamp, MD;  Location: [facility] INVASIVE CV LAB;  Service: Cardiovascular;  Laterality: N/A;   COLONOSCOPY WITH PROPOFOL N/A 12/03/2022   Procedure: COLONOSCOPY WITH PROPOFOL;  Surgeon: Meryl Dare, MD;  Location: [facility] ENDOSCOPY;  Service: Gastroenterology;  Laterality: N/A;   ESOPHAGOGASTRODUODENOSCOPY (EGD) WITH PROPOFOL N/A 12/03/2022   Procedure: ESOPHAGOGASTRODUODENOSCOPY (EGD) WITH PROPOFOL;  Surgeon: Meryl Dare, MD;  Location: [facility] ENDOSCOPY;  Service: Gastroenterology;  Laterality: N/A;   INGUINAL HERNIA REPAIR     right   LUMBAR LAMINECTOMY/DECOMPRESSION MICRODISCECTOMY Bilateral 02/08/2015   Procedure: Laminectomy and Foraminotomy - bilateral - Lumbar two-three, three-four, left lumbar four-five with resection of synovial cyst ;  Surgeon: Temple Pacini, MD;  Location: [facility] NEURO ORS;  Service: Neurosurgery;  Laterality: Bilateral;   POLYPECTOMY  12/03/2022   Procedure: POLYPECTOMY;  Surgeon: Meryl Dare, MD;  Location: [facility];  Service: Gastroenterology;;   spinal tap     VASECTOMY       Family History  Problem Relation Age of Onset   Heart disease Mother 58       mother   Heart attack Mother    Lung cancer Father    Heart disease Father    Kidney disease Brother 40       died age 45 heart attack   Heart attack Brother    Heart disease Maternal Grandfather    Colon cancer Neg Hx     Social History:  reports  that he has never smoked. He has never used smokeless tobacco. He reports that he does not drink alcohol and does not use drugs.  Allergies:  Allergies  Allergen Reactions   Amiodarone Other (See Comments)    Pulmonary toxicity   Eszopiclone     Other Reaction(s): not effective    Review of Systems  Cardiovascular:  Negative for chest pain, dyspnea on exertion, leg swelling, palpitations and syncope.     Blood pressure 134/67, pulse 65, temperature (!) 97.4 F (36.3 C), temperature source Oral, resp. rate 18, height 5\' 9"  (1.753 m), weight (!) 147.4 kg,  SpO2 90 %. Body mass index is 47.99 kg/m.   Physical Exam Vitals and nursing note reviewed.  Constitutional:      General: He is not in acute distress.    Appearance: He is obese.  Neck:     Vascular: No JVD.  Cardiovascular:     Rate and Rhythm: Normal rate and regular rhythm.     Heart sounds: Normal heart sounds. No murmur heard. Pulmonary:     Effort: Pulmonary effort is normal.     Breath sounds: Normal breath sounds. No wheezing or rales.  Musculoskeletal:     Right lower leg: No edema.     Left lower leg: No edema.      Medications Prior to Admission  Medication Sig Dispense Refill   albuterol (VENTOLIN HFA) 108 (90 Base) MCG/ACT inhaler Inhale 2 puffs into the lungs every 6 (six) hours as needed for wheezing or shortness of breath. 8 g 3   ALPRAZolam (XANAX) 0.5 MG tablet Take 0.25-0.5 mg by mouth 3 (three) times daily as needed (ear ringing.).     dapagliflozin propanediol (FARXIGA) 10 MG TABS tablet Take 1 tablet (10 mg total) by mouth daily before breakfast. 90 tablet 1   finasteride (PROSCAR) 5 MG tablet Take 5 mg by mouth daily.     furosemide (LASIX) 40 MG tablet Take 1 tablet (40 mg total) by mouth daily as needed for fluid. 90 tablet 1   metoprolol succinate (TOPROL-XL) 100 MG 24 hr tablet Take 100 mg by mouth in the morning. Take with or immediately following a meal.     olmesartan (BENICAR) 40 MG tablet Take 40 mg by mouth daily.     pantoprazole (PROTONIX) 40 MG tablet Take 40 mg by mouth in the morning.  0   potassium chloride SA (KLOR-CON M) 20 MEQ tablet Take 1 tablet (20 mEq total) by mouth in the morning. 90 tablet 1   rosuvastatin (CRESTOR) 20 MG tablet TAKE 1 TABLET(20 MG) BY MOUTH DAILY 90 tablet 1   sertraline (ZOLOFT) 100 MG tablet Take 100 mg by mouth in the morning.  0   tamsulosin (FLOMAX) 0.4 MG CAPS capsule Take 0.4 mg by mouth daily after breakfast.     triamcinolone cream (KENALOG) 0.1 % Apply 1 Application topically 2 (two) times daily as  needed (Rash).     XARELTO 20 MG TABS tablet TAKE 1 TABLET(20 MG) BY MOUTH DAILY WITH SUPPER 90 tablet 1   acetaminophen (TYLENOL) 500 MG tablet Take 500 mg by mouth every 6 (six) hours as needed for headache, moderate pain or mild pain (pain).     Azelaic Acid (FINACEA) 15 % gel Apply 1 Application topically 2 (two) times daily as needed (Face).        Current Facility-Administered Medications:    0.9 %  sodium chloride infusion, 250 mL, Intravenous, PRN, Yates Decamp, MD  acetaminophen (TYLENOL) tablet 500 mg, 500 mg, Oral, Q6H PRN, Yates Decamp, MD   albuterol (PROVENTIL) (2.5 MG/3ML) 0.083% nebulizer solution 2.5 mg, 2.5 mg, Nebulization, Q6H PRN, Yates Decamp, MD   ALPRAZolam Prudy Feeler) tablet 0.25-0.5 mg, 0.25-0.5 mg, Oral, TID PRN, Yates Decamp, MD   dapagliflozin propanediol (FARXIGA) tablet 10 mg, 10 mg, Oral, QAC breakfast, Yates Decamp, MD   dofetilide (TIKOSYN) capsule 500 mcg, 500 mcg, Oral, BID, Celestino Ackerman J, MD   finasteride (PROSCAR) tablet 5 mg, 5 mg, Oral, Daily, Yates Decamp, MD   furosemide (LASIX) tablet 40 mg, 40 mg, Oral, Daily PRN, Yates Decamp, MD   irbesartan (AVAPRO) tablet 300 mg, 300 mg, Oral, Daily, Yates Decamp, MD   metoprolol succinate (TOPROL-XL) 24 hr tablet 100 mg, 100 mg, Oral, Daily, Yates Decamp, MD   pantoprazole (PROTONIX) EC tablet 40 mg, 40 mg, Oral, Daily, Yates Decamp, MD   potassium chloride SA (KLOR-CON M) CR tablet 20 mEq, 20 mEq, Oral, Daily, Hurth, Kimberly P, RPH   rivaroxaban (XARELTO) tablet 20 mg, 20 mg, Oral, Q supper, Yates Decamp, MD   rosuvastatin (CRESTOR) tablet 10 mg, 10 mg, Oral, Daily, Yates Decamp, MD   sertraline (ZOLOFT) tablet 100 mg, 100 mg, Oral, Daily, Yates Decamp, MD   sodium chloride flush (NS) 0.9 % injection 3 mL, 3 mL, Intravenous, Q12H, Yates Decamp, MD   sodium chloride flush (NS) 0.9 % injection 3 mL, 3 mL, Intravenous, PRN, Yates Decamp, MD   tamsulosin (FLOMAX) capsule 0.4 mg, 0.4 mg, Oral, QPC breakfast, Yates Decamp, MD   Today's  Vitals   05/02/23 1946 05/03/23 0003 05/03/23 0444 05/03/23 0734  BP: 116/60 118/73 131/81 134/67  Pulse: (!) 57 (!) 51 65   Resp: 18 18 18 18   Temp: 97.7 F (36.5 C) 97.7 F (36.5 C) (!) 97.4 F (36.3 C) (!) 97.4 F (36.3 C)  TempSrc: Oral Oral Oral Oral  SpO2: 93% 94% 91% 90%  Weight:      Height:      PainSc: 0-No pain      Body mass index is 47.99 kg/m.     Lab Results: Reviewed and interpreted: CBC, BMP, Mg    Tests ordered:  Lab Orders         Magnesium         Basic metabolic panel         Basic metabolic panel         Magnesium         Potassium       Cardiac Studies:  Telemetry 05/03/2023: Conversion to sinus rhythm this morning       EKG 05/02/2023: Sinus rhythm with 1st degree A-V block Pulmonary disease pattern Incomplete right bundle branch block Left anterior fascicular block Cannot rule out Inferior infarct (masked by fascicular block?) , age undetermined Abnormal ECG  Echocardiogram 02/24/2023:  1. Left ventricular ejection fraction, by estimation, is 60 to 65%. The left ventricle has normal function. The left ventricle has no regional wall motion abnormalities. There is mild left ventricular hypertrophy. Left ventricular diastolic parameters  were normal.   2. Right ventricular systolic function is normal. The right ventricular size is normal. There is moderately elevated pulmonary artery systolic pressure. The estimated right ventricular systolic pressure is 59.2 mmHg.   3. Left atrial size was mildly dilated.   4. Right atrial size was mildly dilated.   5. The mitral valve is normal in structure. No evidence of mitral valve  regurgitation. No evidence of  mitral stenosis.   6. The aortic valve is normal in structure. Aortic valve regurgitation is  not visualized. No aortic stenosis is present.   7. There is severe dilatation of the ascending aorta, measuring 50 mm.   8. The inferior vena cava is dilated in size with <50% respiratory   variability, suggesting right atrial pressure of 15 mmHg.    Cardiac telemetry 11/12/2023: Patch Wear Time:  14 days and 0 hours (2023-10-26T10:18:40-0400 to 2023-11-09T09:18:44-0500)   Atrial Fibrillation/Flutter occurred continuously (100% burden), ranging from 35-115 bpm (avg of 60 bpm). Isolated VEs were rare (<1.0%), and no VE Couplets or VE Triplets were present.     Imaging/tests reviewed and independently interpreted:  CT chest 12/2022: 1. The previous nodular ground-glass opacities have nearly resolved compatible with resolving infection. 2. Cardiomegaly. New pulmonary edema since 10/21/2022. Trace right pleural effusion. 3. Unchanged 49 mm ascending aortic aneurysm. Ascending thoracic aortic aneurysm. Recommend semi-annual imaging followup by CTA or MRA and referral to cardiothoracic surgery if not already obtained. This recommendation follows 2010 ACCF/AHA/AATS/ACR/ASA/SCA/SCAI/SIR/STS/SVM Guidelines for the Diagnosis and Management of Patients With Thoracic Aortic Disease. Circulation. 2010; 121: U725-D664. Aortic aneurysm NOS (ICD10-I71.9). Aortic Atherosclerosis (ICD10-I70.0).    Assessment & Recommendations:  82 y.o. caucasian male  with hypertension, hyperlipidemia, obesity, stable 5 cm ascending aorta aneurysm, PAF w/h/o ablation.  PAF: In sinus rhythm with first degree AV block this morning. Qtc 474 msec, which will be monitored closely.  Keep K around 4, Mg around 2. Patient will start Tikosyn 500 mg this morning. Even if he were to go back into Afib, I doubt cardioversion would help since he seems to self convert from time to time anyway. Ideally would just need to get therapeutic on Tikosyn. Anticipate discharge on 5/8 afternoon, possibly after 5th dose of Tikosyn  Hypertension: Continue metoprolol succinate 100 mg daily, Irbesartan 300 mg daily.  Mixed hyperlipidemia: Continue Crestor 10 mg daily.   Obesity: Encouraged continued weight  loss     Elder Negus, MD Pager: [phone] Office: [phone]

## 2023-05-03 NOTE — Progress Notes (Signed)
2nd dose of Tikosyn given at 2010.   EKG personally reviewed.   Sinus Bradycardia  First degree AVB .  QT correction by Irving Burton -    Check EKG prior to third dose.   Overall doing well per RN.   Continue current care.   Tessa Lerner, [place], [facility]  Pager:  [phone] Office: [phone]

## 2023-05-03 NOTE — Progress Notes (Signed)
Sinus bradycardia with 1st degree A-V block Prolonged QT Low voltage QRS Abnormal ECG When compared with ECG of 02-May-2023 19:04, PREVIOUS ECG IS PRESENT Since last tracing QT has lengthened  Qtc 491. May continue Tikosyn for now, but if increases >500 msec, would reduce Tikosyn to 250 mcg bid.   Elder Negus, MD Pager: [phone] Office: [phone]

## 2023-05-03 NOTE — Telephone Encounter (Signed)
Pharmacy Patient Advocate Encounter  Insurance verification completed.    The patient is insured through [employer] Part D   The patient is currently admitted and ran test claims for the following: dofetilide (Tikosyn).  Copays and coinsurance results were relayed to Inpatient clinical team.  

## 2023-05-04 LAB — BASIC METABOLIC PANEL
Anion gap: 8 (ref 5–15)
BUN: 8 mg/dL (ref 8–23)
CO2: 22 mmol/L (ref 22–32)
Calcium: 8.9 mg/dL (ref 8.9–10.3)
Chloride: 104 mmol/L (ref 98–111)
Creatinine, Ser: 1.05 mg/dL (ref 0.61–1.24)
GFR, Estimated: >60 mL/min (ref >60)
Glucose, Bld: 107 mg/dL — ABNORMAL HIGH (ref 70–99)
Potassium: 3.9 mmol/L (ref 3.5–5.1)
Sodium: 134 mmol/L — ABNORMAL LOW (ref 135–145)

## 2023-05-04 LAB — MAGNESIUM: Magnesium: 2.6 mg/dL — ABNORMAL HIGH (ref 1.7–2.4)

## 2023-05-04 MED ORDER — POTASSIUM CHLORIDE CRYS ER 20 MEQ PO TBCR
40.0000 meq | EXTENDED_RELEASE_TABLET | Freq: Once | ORAL | Status: AC
  Administered 2023-05-04: 40 meq via ORAL
  Filled 2023-05-04: qty 2

## 2023-05-04 MED ORDER — DOFETILIDE 250 MCG PO CAPS
250.0000 ug | ORAL_CAPSULE | Freq: Two times a day (BID) | ORAL | Status: DC
  Administered 2023-05-04 – 2023-05-05 (×3): 250 ug via ORAL
  Filled 2023-05-04 (×3): qty 1

## 2023-05-04 NOTE — Progress Notes (Addendum)
Pharmacy: Dofetilide (Tikosyn) - Follow Up Assessment and Electrolyte Replacement  Pharmacy consulted to assist in monitoring and replacing electrolytes in this 82 y.o. male admitted on 05/02/2023 undergoing dofetilide initiation. First dofetilide dose: pending  Labs:    Component Value Date/Time   K 3.9 05/04/2023 0315   MG 2.6 (H) 05/04/2023 0315    Qtc 513 on EKG last night - discussed with MD and will reduce tikosyn dose to 250 mcg.   Plan: Potassium: K 3.8-3.9:  Give KCl 40 mEq po x1  - will continue PTA Kcl 20 mEq qd in addition  Magnesium: Mg > 2: No additional supplementation needed  Thank you for allowing pharmacy to participate in this patient's care,  Sherron Monday, PharmD, BCCCP Clinical Pharmacist  Phone: [phone] 05/04/2023 7:18 AM  Please check AMION for all [facility] Pharmacy phone numbers After 10:00 PM, call Main Pharmacy [phone]

## 2023-05-04 NOTE — Progress Notes (Signed)
May be  screened into LUX-Dx TRENDS study (loop implantation for heart failure sensor).

## 2023-05-04 NOTE — Progress Notes (Signed)
Subjective:  Feels well   Current Facility-Administered Medications:    0.9 %  sodium chloride infusion, 250 mL, Intravenous, PRN, Yates Decamp, MD   acetaminophen (TYLENOL) tablet 500 mg, 500 mg, Oral, Q6H PRN, Yates Decamp, MD   albuterol (PROVENTIL) (2.5 MG/3ML) 0.083% nebulizer solution 2.5 mg, 2.5 mg, Nebulization, Q6H PRN, Yates Decamp, MD   ALPRAZolam Prudy Feeler) tablet 0.25-0.5 mg, 0.25-0.5 mg, Oral, TID PRN, Yates Decamp, MD   dapagliflozin propanediol (FARXIGA) tablet 10 mg, 10 mg, Oral, QAC breakfast, Yates Decamp, MD, 10 mg at 05/04/23 1610   dofetilide (TIKOSYN) capsule 250 mcg, 250 mcg, Oral, BID, Isael Stille J, MD, 250 mcg at 05/04/23 0843   finasteride (PROSCAR) tablet 5 mg, 5 mg, Oral, Daily, Yates Decamp, MD, 5 mg at 05/04/23 0843   furosemide (LASIX) tablet 40 mg, 40 mg, Oral, Daily PRN, Yates Decamp, MD   irbesartan (AVAPRO) tablet 300 mg, 300 mg, Oral, Daily, Yates Decamp, MD, 300 mg at 05/04/23 0842   metoprolol succinate (TOPROL-XL) 24 hr tablet 100 mg, 100 mg, Oral, Daily, Yates Decamp, MD, 100 mg at 05/04/23 0843   pantoprazole (PROTONIX) EC tablet 40 mg, 40 mg, Oral, Daily, Yates Decamp, MD, 40 mg at 05/04/23 0842   potassium chloride SA (KLOR-CON M) CR tablet 20 mEq, 20 mEq, Oral, Daily, Kendal Hymen, RPH, 20 mEq at 05/04/23 0848   rivaroxaban (XARELTO) tablet 20 mg, 20 mg, Oral, Q supper, Yates Decamp, MD, 20 mg at 05/03/23 1749   rosuvastatin (CRESTOR) tablet 10 mg, 10 mg, Oral, Daily, Yates Decamp, MD, 10 mg at 05/04/23 0842   sertraline (ZOLOFT) tablet 100 mg, 100 mg, Oral, Daily, Yates Decamp, MD, 100 mg at 05/04/23 0842   sodium chloride flush (NS) 0.9 % injection 3 mL, 3 mL, Intravenous, Q12H, Yates Decamp, MD, 3 mL at 05/03/23 0907   sodium chloride flush (NS) 0.9 % injection 3 mL, 3 mL, Intravenous, PRN, Yates Decamp, MD   tamsulosin (FLOMAX) capsule 0.4 mg, 0.4 mg, Oral, QPC breakfast, Yates Decamp, MD, 0.4 mg at 05/04/23 0842   Objective:  Vital Signs in the last 24  hours: Temp:  [97.5 F (36.4 C)-98.4 F (36.9 C)] 98.4 F (36.9 C) (05/07 0424) Pulse Rate:  [53-56] 56 (05/07 0843) Resp:  [18] 18 (05/07 0424) BP: (119-129)/(50-71) 122/65 (05/07 0843) SpO2:  [96 %-99 %] 96 % (05/07 0424)  Intake/Output from previous day: No intake/output data recorded.  Physical Exam Vitals and nursing note reviewed.  Constitutional:      General: He is not in acute distress. Neck:     Vascular: No JVD.  Cardiovascular:     Rate and Rhythm: Normal rate and regular rhythm.     Heart sounds: Normal heart sounds. No murmur heard. Pulmonary:     Effort: Pulmonary effort is normal.     Breath sounds: Normal breath sounds. No wheezing or rales.  Musculoskeletal:     Right lower leg: No edema.     Left lower leg: No edema.      Imaging/tests reviewed and independently interpreted: CT chest 12/2022: 1. The previous nodular ground-glass opacities have nearly resolved compatible with resolving infection. 2. Cardiomegaly. New pulmonary edema since 10/21/2022. Trace right pleural effusion. 3. Unchanged 49 mm ascending aortic aneurysm. Ascending thoracic aortic aneurysm. Recommend semi-annual imaging followup by CTA or MRA and referral to cardiothoracic surgery if not already obtained. This recommendation follows 2010 ACCF/AHA/AATS/ACR/ASA/SCA/SCAI/SIR/STS/SVM Guidelines for the Diagnosis and Management of Patients With Thoracic Aortic Disease. Circulation. 2010; 121:  [phone]. Aortic aneurysm NOS (ICD10-I71.9). Aortic Atherosclerosis (ICD10-I70.0).    Cardiac Studies:  Telemetry 05/04/2023: Sinus rhythm/bradycardia  EKG 05/04/2023: Sinus bradycardia with 1st degree A-V block QTC 484 msec Left axis deviation Pulmonary disease pattern Inferior infarct , age undetermined Abnormal ECG  Echocardiogram 02/24/2023:  1. Left ventricular ejection fraction, by estimation, is 60 to 65%. The left ventricle has normal function. The left ventricle has no regional  wall motion abnormalities. There is mild left ventricular hypertrophy. Left ventricular diastolic parameters  were normal.   2. Right ventricular systolic function is normal. The right ventricular size is normal. There is moderately elevated pulmonary artery systolic pressure. The estimated right ventricular systolic pressure is 59.2 mmHg.   3. Left atrial size was mildly dilated.   4. Right atrial size was mildly dilated.   5. The mitral valve is normal in structure. No evidence of mitral valve  regurgitation. No evidence of mitral stenosis.   6. The aortic valve is normal in structure. Aortic valve regurgitation is  not visualized. No aortic stenosis is present.   7. There is severe dilatation of the ascending aorta, measuring 50 mm.   8. The inferior vena cava is dilated in size with <50% respiratory  variability, suggesting right atrial pressure of 15 mmHg.     Assessment & Recommendations:  82 y.o. caucasian male  with hypertension, hyperlipidemia, obesity, stable 5 cm ascending aorta aneurysm, PAF w/h/o ablation.   PAF: Maintaining sinus rhythm Qtc >500 msec on 05/03/2023. Tikosyn dose reduced to 250 mcg bid, still maintaining sinus rhythm. Anticipate discharge on 5/8 afternoon, possibly after 5th dose of Tikosyn   Hypertension: Continue metoprolol succinate 100 mg daily, Irbesartan 300 mg daily.   Mixed hyperlipidemia: Continue Crestor 10 mg daily.    Obesity: Encouraged continued weight loss  TAA: Stable.    Elder Negus, MD Pager: [phone] Office: [phone]

## 2023-05-05 ENCOUNTER — Other Ambulatory Visit (HOSPITAL_COMMUNITY): Payer: Self-pay

## 2023-05-05 ENCOUNTER — Other Ambulatory Visit: Payer: Self-pay | Admitting: Cardiology

## 2023-05-05 DIAGNOSIS — I10 Essential (primary) hypertension: Secondary | ICD-10-CM

## 2023-05-05 DIAGNOSIS — Z7901 Long term (current) use of anticoagulants: Secondary | ICD-10-CM

## 2023-05-05 DIAGNOSIS — I5032 Chronic diastolic (congestive) heart failure: Secondary | ICD-10-CM

## 2023-05-05 DIAGNOSIS — Z79899 Other long term (current) drug therapy: Secondary | ICD-10-CM

## 2023-05-05 DIAGNOSIS — I480 Paroxysmal atrial fibrillation: Secondary | ICD-10-CM

## 2023-05-05 DIAGNOSIS — Z5181 Encounter for therapeutic drug level monitoring: Secondary | ICD-10-CM

## 2023-05-05 LAB — BASIC METABOLIC PANEL
Anion gap: 7 (ref 5–15)
BUN: 18 mg/dL (ref 8–23)
CO2: 24 mmol/L (ref 22–32)
Calcium: 9 mg/dL (ref 8.9–10.3)
Chloride: 104 mmol/L (ref 98–111)
Creatinine, Ser: 1.36 mg/dL — ABNORMAL HIGH (ref 0.61–1.24)
GFR, Estimated: 52 mL/min — ABNORMAL LOW (ref >60)
Glucose, Bld: 111 mg/dL — ABNORMAL HIGH (ref 70–99)
Potassium: 4.2 mmol/L (ref 3.5–5.1)
Sodium: 135 mmol/L (ref 135–145)

## 2023-05-05 LAB — MAGNESIUM: Magnesium: 2.5 mg/dL — ABNORMAL HIGH (ref 1.7–2.4)

## 2023-05-05 MED ORDER — DOFETILIDE 250 MCG PO CAPS
250.0000 ug | ORAL_CAPSULE | Freq: Two times a day (BID) | ORAL | 2 refills | Status: DC
  Filled 2023-05-05: qty 60, 30d supply, fill #0

## 2023-05-05 NOTE — Progress Notes (Signed)
PT had morning dose of Tikosyn. Post 2 hr EKG reviewed calculate QT . Pt due for noon follow up EKG. Reva Bores 05/05/23 11:20 AM

## 2023-05-05 NOTE — Discharge Summary (Addendum)
Physician Discharge Summary  Patient ID: Joseph [name] MRN: [id number] DOB/AGE: 1941/02/09 82 y.o.  Admit date: 05/02/2023 Discharge date: 05/05/2023  Primary Discharge Diagnosis: Paroxysmal atrial fibrillation: Initiation of Tikosyn Long-term anticoagulation  Secondary Discharge Diagnosis: Chronic HFpEF Ascending thoracic aortic aneurysm Hypertension. Hypercholesterolemia. Obesity due to excess calories,Body mass index is 47.99 kg/m.   Hospital Course:   82 y.o. Caucasian male  with known history of paroxysmal atrial fibrillation, status post atrial fibrillation ablation in 2013 followed by cardioversion, known ascending thoracic aortic aneurysm been followed by Dr. Laneta Simmers, hypertension, hypercholesterolemia, obesity due to excess calories, negative sleep study in the past, HFpEF.  Has a prolonged history of paroxysmal atrial fibrillation for which she has undergone atrial fibrillation ablation as well as cardioversions in the past.  He was able to convert to normal sinus rhythm status post cardioversion but shortly thereafter went back into A-fib.  He was admitted electively during his hospitalization for Tikosyn loading.  Amiodarone in the past has been discontinued due to concerns for possible pulmonary toxicity.  Initially was given 500 mcg of Tikosyn due to prolonged  QT interval the dose of Tikosyn was reduced to 250 mcg p.o. twice daily.  He has received total of 5 doses and follow-up EKGs note stable QT interval.  Underlying rhythm sinus bradycardia with first-degree AV block.   Discharge Exam: Temp:  [97.7 F (36.5 C)-97.8 F (36.6 C)] 97.7 F (36.5 C) (05/08 0805) Pulse Rate:  [55-61] 57 (05/08 0805) Cardiac Rhythm: Sinus bradycardia;Heart block (05/08 0700) Resp:  [18] 18 (05/08 0805) BP: (101-128)/(63-80) 126/66 (05/08 0805) SpO2:  [96 %-97 %] 96 % (05/08 0805)  Today's Vitals   05/04/23 2014 05/05/23 0406 05/05/23 0805 05/05/23 1000  BP:  128/80 126/66    Pulse:  61 (!) 57   Resp:  18 18   Temp:  97.8 F (36.6 C) 97.7 F (36.5 C)   TempSrc:  Oral Oral   SpO2:  97% 96%   Weight:      Height:      PainSc: 0-No pain   0-No pain   Body mass index is 47.99 kg/m.  Physical Exam  Constitutional: No distress.  Age appropriate, hemodynamically stable.   Neck: No JVD present.  Cardiovascular: Regular rhythm, S1 normal, S2 normal, intact distal pulses and normal pulses. Bradycardia present. Exam reveals no gallop, no S3 and no S4.  No murmur heard. Pulmonary/Chest: Effort normal and breath sounds normal. No stridor. He has no wheezes. He has no rales.  Abdominal: Soft. Bowel sounds are normal. He exhibits no distension. There is no abdominal tenderness.  Obese  Musculoskeletal:        General: Edema (Trace bilaterally) present.     Cervical back: Neck supple.  Neurological: He is alert and oriented to person, place, and time. He has intact cranial nerves (2-12).  Skin: Skin is warm and moist.   RECOMMENDATIONS ON DISCHARGE:   #1.  Patient will be discharged on Tikosyn 250 mcg p.o. twice daily  #2 . Continue Toprol-XL 100 mg p.o. daily.  During hospitalizations patient's ventricular rate has remained less than 60 bpm.  He currently has a smart watch I have asked him to track his pulse with ambulation.  If needed the dose of Toprol-XL could be reduced at the follow-up visit.  He remains asymptomatic with regards to his bradycardia for now.   #3.   Will order BMP to check potassium and serum creatinine levels prior to next office visit.  #4.  Plan of care discussed with the patient and wife at bedside.  TOC visit created.  Patient will be discharged home once [facility] pharmacy delivers medication at bedside  CARDIAC DATABASE: EKG: May 05, 2023: Sinus bradycardia, first-degree AV block, corrected QTc 475 ms  Left Heart Catheterization 12/13/2008:  1. Hemodynamic data:      a.     Aortic pressure 126/76.      b.     Left ventricular pressure  128/40 mmHg.  2. Left ventriculography:  LV cavity size and function are normal.  EF is 60%.  No obvious mitral regurgitation is noted.  3. Coronary angiography.      a.     Left main coronary artery:  Normal.      b.     Left anterior descending coronary artery:  Normal.  The vessel gives origin to 2 diagonal branches also normal.  The LAD is transapical.      c.     Circumflex artery:  The circumflex coronary artery gives origin to 3 marginals, they are normal.      d.     Right coronary artery:  The right coronary artery is normal. The PDA is large and normal.   Echocardiogram 02/24/2023: 1. Left ventricular ejection fraction, by estimation, is 60 to 65%. The left ventricle has normal function. The left ventricle has no regional wall motion abnormalities. There is mild left ventricular hypertrophy. Left ventricular diastolic parameters  were normal.  2. Right ventricular systolic function is normal. The right ventricular size is normal. There is moderately elevated pulmonary artery systolic pressure. The estimated right ventricular systolic pressure is 59.2 mmHg.  3. Left atrial size was mildly dilated.  4. Right atrial size was mildly dilated.  5. The mitral valve is normal in structure. No evidence of mitral valve regurgitation. No evidence of mitral stenosis.  6. The aortic valve is normal in structure. Aortic valve regurgitation is not visualized. No aortic stenosis is present.  7. There is severe dilatation of the ascending aorta, measuring 50 mm.  8. The inferior vena cava is dilated in size with <50% respiratory variability, suggesting right atrial pressure of 15 mmHg.   Direct current cardioversion 04/16/2023 11:18 AM   Indication symptomatic A. Fibrillation.   Procedure: Using 60 mg of IV Propofol and 60 IV Lidocaine (for reducing venous pain) for achieving deep sedation, synchronized direct current cardioversion performed. Patient was delivered with 150 Joules of electricity X 1  with success to NSR. Patient tolerated the procedure well. No immediate complication noted.   Labs:   Lab Results  Component Value Date   WBC 8.7 02/02/2023   HGB 11.7 (L) 02/02/2023   HCT 39.3 02/02/2023   MCV 87 02/02/2023   PLT 319 02/02/2023    Recent Labs  Lab 05/05/23 0158  NA 135  K 4.2  CL 104  CO2 24  BUN 18  CREATININE 1.36*  CALCIUM 9.0  GLUCOSE 111*    Lipid Panel     Component Value Date/Time   CHOL 151 11/01/2019 0758   TRIG 171 (H) 11/01/2019 0758   HDL 55 11/01/2019 0758   CHOLHDL 2.7 11/01/2019 0758   CHOLHDL 3 05/13/2011 0900   VLDL 17.4 05/13/2011 0900   LDLCALC 67 11/01/2019 0758    BNP (last 3 results) No results for input(s): "BNP" in the last 8760 hours.  HEMOGLOBIN A1C No results found for: "HGBA1C", "MPG"  Cardiac Panel (last 3 results) No results for input(s): "CKTOTAL", "CKMB", "  TROPONINI", "RELINDX" in the last 8760 hours.  Lab Results  Component Value Date   TROPONINI <0.01        NO INDICATION OF MYOCARDIAL INJURY. 02/26/2011     TSH Recent Labs    10/12/22 1407  TSH 2.890    Radiology: EP STUDY  Result Date: 04/16/2023 See surgical note for result.   FOLLOW UP PLANS AND APPOINTMENTS  Allergies as of 05/05/2023       Reactions   Amiodarone Other (See Comments)   Pulmonary toxicity   Eszopiclone    Other Reaction(s): not effective        Medication List     TAKE these medications    acetaminophen 500 MG tablet Commonly known as: TYLENOL Take 500 mg by mouth every 6 (six) hours as needed for headache, moderate pain or mild pain (pain).   albuterol 108 (90 Base) MCG/ACT inhaler Commonly known as: VENTOLIN HFA Inhale 2 puffs into the lungs every 6 (six) hours as needed for wheezing or shortness of breath.   ALPRAZolam 0.5 MG tablet Commonly known as: XANAX Take 0.25-0.5 mg by mouth 3 (three) times daily as needed (ear ringing.).   dapagliflozin propanediol 10 MG Tabs tablet Commonly known as:  Farxiga Take 1 tablet (10 mg total) by mouth daily before breakfast.   dofetilide 250 MCG capsule Commonly known as: TIKOSYN Take 1 capsule (250 mcg total) by mouth 2 (two) times daily.   Finacea 15 % gel Generic drug: Azelaic Acid Apply 1 Application topically 2 (two) times daily as needed (Face).   finasteride 5 MG tablet Commonly known as: PROSCAR Take 5 mg by mouth daily.   furosemide 40 MG tablet Commonly known as: Lasix Take 1 tablet (40 mg total) by mouth daily as needed for fluid. What changed: when to take this   IRON PO Take 1 tablet by mouth daily as needed (Supplement).   metoprolol succinate 100 MG 24 hr tablet Commonly known as: TOPROL-XL Take 100 mg by mouth in the morning. Take with or immediately following a meal.   olmesartan 40 MG tablet Commonly known as: BENICAR Take 40 mg by mouth daily.   pantoprazole 40 MG tablet Commonly known as: PROTONIX Take 40 mg by mouth in the morning.   potassium chloride SA 20 MEQ tablet Commonly known as: KLOR-CON M Take 1 tablet (20 mEq total) by mouth in the morning.   rosuvastatin 20 MG tablet Commonly known as: CRESTOR TAKE 1 TABLET(20 MG) BY MOUTH DAILY What changed: See the new instructions.   sertraline 100 MG tablet Commonly known as: ZOLOFT Take 100 mg by mouth in the morning.   tamsulosin 0.4 MG Caps capsule Commonly known as: FLOMAX Take 0.4 mg by mouth daily after breakfast.   triamcinolone cream 0.1 % Commonly known as: KENALOG Apply 1 Application topically 2 (two) times daily as needed (Rash).   Xarelto 20 MG Tabs tablet Generic drug: rivaroxaban TAKE 1 TABLET(20 MG) BY MOUTH DAILY WITH SUPPER What changed: See the new instructions.        Follow-up Information     Yates Decamp, MD. Go on 05/13/2023.   Specialty: Cardiology Why: 1030am Labs to be done 24-48hr before office visit at the nearest [facility] (non-fasting). Contact information: [address] [place]  [address] [phone]                Total time spent on patient's discharge was 35 minutes.  Mykle Pascua Odis Hollingshead, DO, [facility]  Pager:  [phone] Office: [phone]

## 2023-05-05 NOTE — Care Management (Signed)
1610 05-05-23 Patient is agreeable to cost. Patient would like the initial Rx filled via [facility] and Rx refills escribed to [facility]. No further needs identified at this time.

## 2023-05-05 NOTE — Care Management Important Message (Signed)
Important Message  Patient Details  Name: Joseph [name] MRN: [id number] Date of Birth: Apr 27, 1941   Medicare Important Message Given:  Yes     Renie Ora 05/05/2023, 9:24 AM

## 2023-05-05 NOTE — Progress Notes (Addendum)
Pharmacy: Dofetilide (Tikosyn) - Follow Up Assessment and Electrolyte Replacement  Pharmacy consulted to assist in monitoring and replacing electrolytes in this 82 y.o. male admitted on 05/02/2023 undergoing dofetilide initiation. First dofetilide dose: 5/6@0904 .  Labs:    Component Value Date/Time   K 4.2 05/05/2023 0158   MG 2.5 (H) 05/05/2023 0158    Plan: Potassium: K >/= 4: No additional supplementation needed - will continue PTA Kcl 20 mEq qd in addition  Magnesium: Mg > 2: No additional supplementation needed  As patient has required on average 20 mEq of potassium replacement every day (on top of scheduled 20 mEq daily), recommend discharging patient with prescription for:  Could consider potassium chloride 40 mEq  daily or close lab monitoring if continuing PTA potassium dosing  Thank you for allowing pharmacy to participate in this patient's care,  Sherron Monday, PharmD, BCCCP Clinical Pharmacist  Phone: [phone] 05/05/2023 7:26 AM  Please check AMION for all [facility] phone numbers After 10:00 PM, call Main Pharmacy [phone]

## 2023-05-05 NOTE — Progress Notes (Signed)
Post Tikosyn EKG shows Qtc 489

## 2023-05-05 NOTE — Plan of Care (Signed)
Pt A&OX4 Tikosyn levels WNL. TOC meds brought to bedside. IV removed Tele D/C. AVS reviewewed follow up questions answered. BP 126/66 (BP Location: Right Arm)   Pulse (!) 57   Temp 97.7 F (36.5 C) (Oral)   Resp 18   Ht 5\' 9"  (1.753 m)   Wt (!) 147.4 kg   SpO2 96%   BMI 47.99 kg/m  No other questions at this time. Reva Bores 05/05/23 2:09 PM

## 2023-05-10 DIAGNOSIS — H2511 Age-related nuclear cataract, right eye: Secondary | ICD-10-CM | POA: Diagnosis not present

## 2023-05-10 DIAGNOSIS — H25811 Combined forms of age-related cataract, right eye: Secondary | ICD-10-CM | POA: Diagnosis not present

## 2023-05-11 ENCOUNTER — Other Ambulatory Visit: Payer: Self-pay | Admitting: Cardiology

## 2023-05-11 DIAGNOSIS — Z79899 Other long term (current) drug therapy: Secondary | ICD-10-CM | POA: Diagnosis not present

## 2023-05-11 DIAGNOSIS — I480 Paroxysmal atrial fibrillation: Secondary | ICD-10-CM | POA: Diagnosis not present

## 2023-05-11 DIAGNOSIS — Z7901 Long term (current) use of anticoagulants: Secondary | ICD-10-CM | POA: Diagnosis not present

## 2023-05-11 DIAGNOSIS — H2512 Age-related nuclear cataract, left eye: Secondary | ICD-10-CM | POA: Diagnosis not present

## 2023-05-12 LAB — BASIC METABOLIC PANEL
BUN/Creatinine Ratio: 13 (ref 10–24)
BUN: 15 mg/dL (ref 8–27)
CO2: 20 mmol/L (ref 20–29)
Calcium: 9.7 mg/dL (ref 8.6–10.2)
Chloride: 99 mmol/L (ref 96–106)
Creatinine, Ser: 1.19 mg/dL (ref 0.76–1.27)
Glucose: 104 mg/dL — ABNORMAL HIGH (ref 70–99)
Potassium: 5.5 mmol/L — ABNORMAL HIGH (ref 3.5–5.2)
Sodium: 136 mmol/L (ref 134–144)
eGFR: 61 mL/min/{1.73_m2} (ref >59)

## 2023-05-12 LAB — PRO B NATRIURETIC PEPTIDE: NT-Pro BNP: 316 pg/mL (ref 0–486)

## 2023-05-12 LAB — MAGNESIUM: Magnesium: 2.4 mg/dL — ABNORMAL HIGH (ref 1.6–2.3)

## 2023-05-13 ENCOUNTER — Ambulatory Visit: Payer: Medicare PPO | Admitting: Cardiology

## 2023-05-13 ENCOUNTER — Encounter: Payer: Self-pay | Admitting: Cardiology

## 2023-05-13 VITALS — BP 122/72 | HR 60 | Ht 69.0 in | Wt 312.0 lb

## 2023-05-13 DIAGNOSIS — Z79899 Other long term (current) drug therapy: Secondary | ICD-10-CM

## 2023-05-13 DIAGNOSIS — I5032 Chronic diastolic (congestive) heart failure: Secondary | ICD-10-CM | POA: Diagnosis not present

## 2023-05-13 DIAGNOSIS — I480 Paroxysmal atrial fibrillation: Secondary | ICD-10-CM

## 2023-05-13 DIAGNOSIS — N1831 Chronic kidney disease, stage 3a: Secondary | ICD-10-CM | POA: Diagnosis not present

## 2023-05-13 DIAGNOSIS — I129 Hypertensive chronic kidney disease with stage 1 through stage 4 chronic kidney disease, or unspecified chronic kidney disease: Secondary | ICD-10-CM | POA: Diagnosis not present

## 2023-05-13 MED ORDER — DOFETILIDE 250 MCG PO CAPS
250.0000 ug | ORAL_CAPSULE | Freq: Two times a day (BID) | ORAL | 1 refills | Status: DC
Start: 2023-05-13 — End: 2023-11-22

## 2023-05-13 MED ORDER — MAGNESIUM OXIDE -MG SUPPLEMENT 400 (240 MG) MG PO TABS
400.0000 mg | ORAL_TABLET | Freq: Every day | ORAL | 2 refills | Status: DC
Start: 2023-05-13 — End: 2024-03-28

## 2023-05-13 NOTE — Progress Notes (Signed)
Primary Physician/Referring:  Merri Brunette, MD  Patient ID: Joseph [name], male    DOB: [birth year], 82 y.o.   MRN: [id number]  Chief Complaint  Patient presents with  . Paroxysmal atrial fibrillation (HCC)  . Chronic diastolic heart failure  . Hospitalization Follow-up   HPI:    Joseph [name]  is a 82 y.o. Caucasian male patient with paroxysmal atrial fibrillation SP atrial fibrillation ablation on 2013, ascending thoracic aortic aneurysm being followed by Dr. Cleon Dew stable at around 5 cm since 2017 or longer, primary hypertension, hypercholesterolemia, morbid obesity, negative sleep study in the past, chronic diastolic heart failure underwent direct-current cardioversion on 04/16/2023. Patient was admitted to the hospital on 05/02/2023 and discharged 3 days later after initiation of Tikosyn for PAF, patient was in sinus rhythm upon presentation.  He now presents for follow-up.   States that he has maintained sinus rhythm since hospital discharge, overall feels well, dyspnea is improved. Denies PND or orthopnea, denies weight gain, denies leg edema.  He has been extremely careful with his diet.  He continues to lose weight.   Past Medical History:  Diagnosis Date  . Allergy   . Atrial fibrillation (HCC) 11/2008  . Atypical atrial flutter (HCC) 07/13/2014  . Colon polyp   . Depression   . Diverticulosis   . Dysrhythmia   . GERD (gastroesophageal reflux disease)   . Headache(784.0)   . Hyperlipidemia   . Hypertension   . Panic attacks   . PONV (postoperative nausea and vomiting)   . Thoracic aneurysm    Past Surgical History:  Procedure Laterality Date  . ablataion  07/06/2012  . APPENDECTOMY  1958  . BIOPSY  12/03/2022   Procedure: BIOPSY;  Surgeon: Meryl Dare, MD;  Location: [facility];  Service: Gastroenterology;;  . CARDIAC CATHETERIZATION  2009  . CARDIOVERSION  03/18/2012   Procedure: CARDIOVERSION;  Surgeon: Lesleigh Noe, MD;  Location: [facility] OR;   Service: Cardiovascular;  Laterality: N/A;  . CARDIOVERSION N/A 08/23/2014   Procedure: CARDIOVERSION;  Surgeon: Lesleigh Noe, MD;  Location: [facility] ENDOSCOPY;  Service: Cardiovascular;  Laterality: N/A;  . CARDIOVERSION N/A 04/16/2023   Procedure: CARDIOVERSION;  Surgeon: Yates Decamp, MD;  Location: [facility] INVASIVE CV LAB;  Service: Cardiovascular;  Laterality: N/A;  . COLONOSCOPY WITH PROPOFOL N/A 12/03/2022   Procedure: COLONOSCOPY WITH PROPOFOL;  Surgeon: Meryl Dare, MD;  Location: [facility] ENDOSCOPY;  Service: Gastroenterology;  Laterality: N/A;  . ESOPHAGOGASTRODUODENOSCOPY (EGD) WITH PROPOFOL N/A 12/03/2022   Procedure: ESOPHAGOGASTRODUODENOSCOPY (EGD) WITH PROPOFOL;  Surgeon: Meryl Dare, MD;  Location: [facility] ENDOSCOPY;  Service: Gastroenterology;  Laterality: N/A;  . INGUINAL HERNIA REPAIR     right  . LUMBAR LAMINECTOMY/DECOMPRESSION MICRODISCECTOMY Bilateral 02/08/2015   Procedure: Laminectomy and Foraminotomy - bilateral - Lumbar two-three, three-four, left lumbar four-five with resection of synovial cyst ;  Surgeon: Temple Pacini, MD;  Location: [facility] NEURO ORS;  Service: Neurosurgery;  Laterality: Bilateral;  . POLYPECTOMY  12/03/2022   Procedure: POLYPECTOMY;  Surgeon: Meryl Dare, MD;  Location: [facility];  Service: Gastroenterology;;  . spinal tap    . VASECTOMY     Family History  Problem Relation Age of Onset  . Heart disease Mother 2       mother  . Heart attack Mother   . Lung cancer Father   . Heart disease Father   . Kidney disease Brother 45       died age 34 heart attack  .  Heart attack Brother   . Heart disease Maternal Grandfather   . Colon cancer Neg Hx     Social History   Tobacco Use  . Smoking status: Never  . Smokeless tobacco: Never  . Tobacco comments:    Never smoke 11/23/22  Substance Use Topics  . Alcohol use: No   Marital Status: Married  ROS  Review of Systems  Constitutional: Negative for malaise/fatigue.  Cardiovascular:  Positive for  dyspnea on exertion and leg swelling. Negative for chest pain.   Objective      05/13/2023   10:38 AM 05/05/2023    2:08 PM 05/05/2023    8:05 AM  Vitals with BMI  Height 5\' 9"     Weight 312 lbs    BMI 46.05    Systolic 122 110 161  Diastolic 72 54 66  Pulse 60 50 57   SpO2: 93 %  Physical Exam Constitutional:      Appearance: He is morbidly obese.  Neck:     Vascular: No carotid bruit or JVD (Short neck).  Cardiovascular:     Rate and Rhythm: Normal rate. Rhythm irregular.     Pulses:          Dorsalis pedis pulses are 0 on the right side and 0 on the left side.       Posterior tibial pulses are 0 on the right side and 0 on the left side.     Heart sounds: No murmur heard.    Comments: Difficult to palpate his popliteal and femoral arteries due to body habitus Pulmonary:     Effort: Pulmonary effort is normal.     Breath sounds: Normal breath sounds.  Abdominal:     General: Abdomen is protuberant. Bowel sounds are normal.     Palpations: Abdomen is soft.  Musculoskeletal:     Right lower leg: Edema (Trace) present.     Left lower leg: Edema (Trace) present.  Skin:    Capillary Refill: Capillary refill takes less than 2 seconds.   Laboratory examination:   Recent Labs    05/03/23 0213 05/03/23 0546 05/04/23 0315 05/05/23 0158 05/11/23 0904  NA 135  --  134* 135 136  K 3.7   < > 3.9 4.2 5.5*  CL 104  --  104 104 99  CO2 22  --  22 24 20   GLUCOSE 107*  --  107* 111* 104*  BUN 17  --  8 18 15   CREATININE 1.35*  --  1.05 1.36* 1.19  CALCIUM 9.2  --  8.9 9.0 9.7  GFRNONAA 53*  --  >60 52*  --    < > = values in this interval not displayed.   Lab Results  Component Value Date   GLUCOSE 104 (H) 05/11/2023   NA 136 05/11/2023   K 5.5 (H) 05/11/2023   CL 99 05/11/2023   CO2 20 05/11/2023   BUN 15 05/11/2023   CREATININE 1.19 05/11/2023   EGFR 61 05/11/2023   CALCIUM 9.7 05/11/2023   PROT 6.9 11/01/2019   ALBUMIN 4.7 11/01/2019   BILITOT 0.4 11/01/2019    ALKPHOS 66 11/01/2019   AST 21 11/01/2019   ALT 20 11/01/2019   ANIONGAP 7 05/05/2023        Latest Ref Rng & Units 02/02/2023   11:26 AM 10/12/2022    2:07 PM 09/22/2021    8:55 AM  CBC  WBC 3.4 - 10.8 x10E3/uL 8.7   6.6   Hemoglobin 13.0 -  17.7 g/dL 16.1  09.6  04.5   Hematocrit 37.5 - 51.0 % 39.3   38.9   Platelets 150 - 450 x10E3/uL 319   270     TSH Recent Labs    10/12/22 1407  TSH 2.890   ProBNP (last 3 results) Recent Labs    03/25/23 1052 04/30/23 0936 05/11/23 0904  PROBNP 1,124* 1,236* 316    External labs:   Labs 03/03/2023:  Serum glucose 97 mg, BUN 19, creatinine 1.38, EGFR 50 to mL, potassium 4.2.  LFTs normal.  Iron studies reveal mild decrease in iron.  Labs 12/29/2022:  Hb 10.1/HCT 32.9, platelets 346, microcytic indicis.  Cholesterol, total 129.000 m 07/23/2022 HDL 62.000 mg 07/23/2022 LDL 51.000 mg 07/23/2022 Triglycerides 76.000 mg 07/23/2022  Radiology:   CT angiogram chest 01/26/2023: 1. The previous nodular ground-glass opacities have nearly resolved compatible with resolving infection. 2. Cardiomegaly. New pulmonary edema since 10/21/2022. Trace right pleural effusion. 3. Unchanged 49 mm ascending aortic aneurysm. Ascending thoracic aortic aneurysm. Recommend semi-annual imaging followup by CTA or MRA and referral to cardiothoracic surgery if not already obtained. Aortic aneurysm stable since 2017.  Chest x-ray two-view 02/28/2023: Cardiac silhouette is prominent. There is pulmonary interstitial prominence with vascular congestion. No focal consolidation. No pneumothorax or pleural effusion identified. There are thoracic degenerative changes. IMPRESSION: Findings suggest CHF.  PFTs 02/02/2023: Moderately severe restrictive defect with mild reduction in DLCO  Cardiac Studies:   Left Heart Catheterization 12/13/2008:  1. Hemodynamic data:      a.     Aortic pressure 126/76.      b.     Left ventricular pressure 128/40 mmHg.  2. Left  ventriculography:  LV cavity size and function are normal.  EF is 60%.  No obvious mitral regurgitation is noted.  3. Coronary angiography.      a.     Left main coronary artery:  Normal.      b.     Left anterior descending coronary artery:  Normal.  The vessel gives origin to 2 diagonal branches also normal.  The LAD is transapical.      c.     Circumflex artery:  The circumflex coronary artery gives origin to 3 marginals, they are normal.      d.     Right coronary artery:  The right coronary artery is normal. The PDA is large and normal.  Echocardiogram 02/24/2023: 1. Left ventricular ejection fraction, by estimation, is 60 to 65%. The left ventricle has normal function. The left ventricle has no regional wall motion abnormalities. There is mild left ventricular hypertrophy. Left ventricular diastolic parameters  were normal.  2. Right ventricular systolic function is normal. The right ventricular size is normal. There is moderately elevated pulmonary artery systolic pressure. The estimated right ventricular systolic pressure is 59.2 mmHg.  3. Left atrial size was mildly dilated.  4. Right atrial size was mildly dilated.  5. The mitral valve is normal in structure. No evidence of mitral valve regurgitation. No evidence of mitral stenosis.  6. The aortic valve is normal in structure. Aortic valve regurgitation is not visualized. No aortic stenosis is present.  7. There is severe dilatation of the ascending aorta, measuring 50 mm.  8. The inferior vena cava is dilated in size with <50% respiratory variability, suggesting right atrial pressure of 15 mmHg.  Direct current cardioversion 04/16/2023 11:18 AM  EKG:   EKG 05/13/2023: Sinus bradycardia with first-degree block at rate of 55 bpm, left axis deviation,  left anterior fascicular block.  Incomplete right bundle branch block.  Poor R progression, cannot exclude anterolateral infarct old.  Normal QT interval, QTc 496 ms.  Compared to 04/29/2023,  atrial fibrillation not present.  Medications and allergies   Allergies  Allergen Reactions  . Amiodarone Other (See Comments)    Pulmonary toxicity  . Eszopiclone     Other Reaction(s): not effective    Medication list    Current Outpatient Medications:  .  acetaminophen (TYLENOL) 500 MG tablet, Take 500 mg by mouth every 6 (six) hours as needed for headache, moderate pain or mild pain (pain)., Disp: , Rfl:  .  albuterol (VENTOLIN HFA) 108 (90 Base) MCG/ACT inhaler, Inhale 2 puffs into the lungs every 6 (six) hours as needed for wheezing or shortness of breath., Disp: 8 g, Rfl: 3 .  ALPRAZolam (XANAX) 0.5 MG tablet, Take 0.25-0.5 mg by mouth 3 (three) times daily as needed (ear ringing.)., Disp: , Rfl:  .  Azelaic Acid (FINACEA) 15 % gel, Apply 1 Application topically 2 (two) times daily as needed (Face)., Disp: , Rfl:  .  dapagliflozin propanediol (FARXIGA) 10 MG TABS tablet, Take 1 tablet (10 mg total) by mouth daily before breakfast., Disp: 90 tablet, Rfl: 1 .  Ferrous Sulfate (IRON PO), Take 1 tablet by mouth daily as needed (Supplement)., Disp: , Rfl:  .  finasteride (PROSCAR) 5 MG tablet, Take 5 mg by mouth daily., Disp: , Rfl:  .  Finerenone (KERENDIA) 10 MG TABS, Take 10 mg by mouth daily at 2 PM., Disp: , Rfl:  .  furosemide (LASIX) 40 MG tablet, Take 1 tablet (40 mg total) by mouth daily as needed for fluid. (Patient taking differently: Take 40 mg by mouth daily.), Disp: 90 tablet, Rfl: 1 .  magnesium oxide (MAGOX 400) 400 (240 Mg) MG tablet, Take 1 tablet (400 mg total) by mouth daily., Disp: 90 tablet, Rfl: 2 .  metoprolol succinate (TOPROL-XL) 100 MG 24 hr tablet, Take 100 mg by mouth in the morning. Take with or immediately following a meal., Disp: , Rfl:  .  olmesartan (BENICAR) 40 MG tablet, Take 40 mg by mouth daily., Disp: , Rfl:  .  pantoprazole (PROTONIX) 40 MG tablet, Take 40 mg by mouth in the morning., Disp: , Rfl: 0 .  potassium chloride SA (KLOR-CON M) 20 MEQ  tablet, Take 1 tablet (20 mEq total) by mouth in the morning., Disp: 90 tablet, Rfl: 1 .  rosuvastatin (CRESTOR) 20 MG tablet, TAKE 1 TABLET(20 MG) BY MOUTH DAILY (Patient taking differently: Take 20 mg by mouth daily.), Disp: 90 tablet, Rfl: 1 .  sertraline (ZOLOFT) 100 MG tablet, Take 100 mg by mouth in the morning., Disp: , Rfl: 0 .  tamsulosin (FLOMAX) 0.4 MG CAPS capsule, Take 0.4 mg by mouth daily after breakfast., Disp: , Rfl:  .  triamcinolone cream (KENALOG) 0.1 %, Apply 1 Application topically 2 (two) times daily as needed (Rash)., Disp: , Rfl:  .  XARELTO 20 MG TABS tablet, TAKE 1 TABLET(20 MG) BY MOUTH DAILY WITH SUPPER (Patient taking differently: Take 20 mg by mouth daily with supper.), Disp: 90 tablet, Rfl: 1 .  dofetilide (TIKOSYN) 250 MCG capsule, Take 1 capsule (250 mcg total) by mouth 2 (two) times daily., Disp: 180 capsule, Rfl: 1   Assessment     ICD-10-CM   1. Paroxysmal atrial fibrillation (HCC)  I48.0 EKG 12-Lead    dofetilide (TIKOSYN) 250 MCG capsule    magnesium oxide (MAGOX 400)  400 (240 Mg) MG tablet    2. Chronic diastolic (congestive) heart failure (HCC)  I50.32     3. Stage 3a chronic kidney disease (HCC)  N18.31     4. High risk medication use  Z79.899        Orders Placed This Encounter  Procedures  . EKG 12-Lead    Meds ordered this encounter  Medications  . dofetilide (TIKOSYN) 250 MCG capsule    Sig: Take 1 capsule (250 mcg total) by mouth 2 (two) times daily.    Dispense:  180 capsule    Refill:  1  . magnesium oxide (MAGOX 400) 400 (240 Mg) MG tablet    Sig: Take 1 tablet (400 mg total) by mouth daily.    Dispense:  90 tablet    Refill:  2    Medications Discontinued During This Encounter  Medication Reason  . dofetilide (TIKOSYN) 250 MCG capsule Reorder     Recommendations:   Joseph [name] is a 82 y.o. Caucasian male patient with paroxysmal atrial fibrillation SP atrial fibrillation ablation on 2013, ascending thoracic aortic  aneurysm being followed by Dr. Cleon Dew stable at around 5 cm since 2017 or longer, primary hypertension, hypercholesterolemia, morbid obesity, negative sleep study in the past, chronic diastolic heart failure underwent direct-current cardioversion on 04/16/2023. Patient was admitted to the hospital on 05/02/2023 and discharged 3 days later after initiation of Tikosyn for PAF, patient was in sinus rhythm upon presentation.  He now presents for follow-up.  1. Paroxysmal atrial fibrillation (HCC) Patient is maintaining sinus rhythm, he is now on Tikosyn 250 mcg p.o. twice daily, I refilled the prescription.  Will also start him on magnesium oxide supplement.  - EKG 12-Lead - dofetilide (TIKOSYN) 250 MCG capsule; Take 1 capsule (250 mcg total) by mouth 2 (two) times daily.  Dispense: 180 capsule; Refill: 1 - magnesium oxide (MAGOX 400) 400 (240 Mg) MG tablet; Take 1 tablet (400 mg total) by mouth daily.  Dispense: 90 tablet; Refill: 2  2. Chronic diastolic (congestive) heart failure (HCC) His heart failure symptoms are improved significantly, leg edema is essentially resolved except for ankle edema, he is now able to walk without help of a walker.  NT proBNP has reduced significantly as well.  3. Stage 3a chronic kidney disease (HCC) Stage IIIa chronic kidney disease has remained stable in spite of aggressive diuresis and also use of guideline directed medical therapy, no changes in the medications were done today as she is doing well and stable from cardiac standpoint.  He is presently on [place] however he is having difficulty in getting this approved with insurance, will try to do prior authorization.   4. High risk medication use Patient is presently on Tikosyn, QT interval has remained stable.  Advised him to change furosemide to taking it every other day or on a as needed basis depending upon leg edema and dyspnea and weight gain.  He has made significant lifestyle changes and appears to be very  motivated and weight loss as well.  I would like to see him back in 6 months for follow-up.   Yates Decamp, MD, [facility] 05/13/2023, 11:07 AM Office: [phone]

## 2023-05-20 ENCOUNTER — Ambulatory Visit (HOSPITAL_BASED_OUTPATIENT_CLINIC_OR_DEPARTMENT_OTHER): Payer: Medicare PPO | Admitting: Pulmonary Disease

## 2023-05-20 ENCOUNTER — Encounter (HOSPITAL_BASED_OUTPATIENT_CLINIC_OR_DEPARTMENT_OTHER): Payer: Self-pay | Admitting: Pulmonary Disease

## 2023-05-20 VITALS — BP 106/62 | HR 69 | Temp 97.7°F | Ht 69.0 in | Wt 312.4 lb

## 2023-05-20 DIAGNOSIS — G4734 Idiopathic sleep related nonobstructive alveolar hypoventilation: Secondary | ICD-10-CM | POA: Diagnosis not present

## 2023-05-20 NOTE — Patient Instructions (Signed)
Shortness of breath Lower extremity edema Moderately severe restrictive defect 2/2 chest wall compliance  --CONTINUE albuterol AS NEEDED for shortness of breath or wheezing --Diuresis per Cardiology --Monitor weight. Take lasix if 3lb weight gain in a day or 5lb weight gain in a week  Nocturnal hypoxemia - reports improved breathing, weight loss --RE-ORDER ONO on room air --If results are normal, can discontinue oxygen --If abnormal, can consider sleep apnea testing

## 2023-05-20 NOTE — Progress Notes (Signed)
Subjective:   PATIENT ID: Joseph [name] GENDER: male DOB: 16-Dec-1941, MRN: [id number]  Chief Complaint  Patient presents with   Follow-up    Follow up. Patient has no complaints.     Reason for Visit: Follow-up shortness of breath  Joseph [name] is a 82 year old male never smoker with HTN, depression, anxiety, CKD III, atrial fibrillation, secondary hypercoagulable state, GERD, HLD who presents for follow-up. Presents with wife  Initial consult Referred by his PCP for incidental pulmonary findings during surveillance of his thoracic aneurysm. He reports shortness of breath with activity. Worsened in the last four months. May have had atypical viral illness but not sure. Denies associated cough, wheezing, chest pain. Before this time point he felt like he was more ambulatory on his walker but currently has had to slow down. Activity limited by back pain and atrial fib.  02/02/23 Since our last visit he remains short of breath with exertion. Denies wheezing or cough. Has had worsening lower extremity swelling. Has raised his legs up for some relief.   02/26/23 Since our last visit his albuterol helps his breathing. Takes it 3 times a day with relief. His shortness of breath has improved after lasix but does feel like he needs additional lasix for 15 out of 30 days this month. Increased lower extremity swelling today after returning to his HCTZ. Activity limited by his back pain. Considering evaluation for surgery in May 8th.  05/20/23 He reports shortness of breath has improved. No longer wheezing. Does use albuterol once a day to start his day. Has not needed for emergent use. Since our last visit he has <50 lbs after making significant dietary modifications. Has not been on his oxygen because he does not feel he needs it.  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  Past Medical History:  Diagnosis Date   Allergy     Atrial fibrillation (HCC) 11/2008   Atypical atrial flutter (HCC) 07/13/2014   Colon polyp    Depression    Diverticulosis    Dysrhythmia    GERD (gastroesophageal reflux disease)    Headache(784.0)    Hyperlipidemia    Hypertension    Panic attacks    PONV (postoperative nausea and vomiting)    Thoracic aneurysm      Family History  Problem Relation Age of Onset   Heart disease Mother 34       mother   Heart attack Mother    Lung cancer Father    Heart disease Father    Kidney disease Brother 19       died age 24 heart attack   Heart attack Brother    Heart disease Maternal Grandfather    Colon cancer Neg Hx      Social History   Occupational History   Occupation: Retired  Tobacco Use   Smoking status: Never   Smokeless tobacco: Never   Tobacco comments:    Never smoke 11/23/22  Vaping Use   Vaping Use: Never used  Substance and Sexual Activity   Alcohol use: No   Drug use: No   Sexual activity: Not on file    Allergies  Allergen Reactions   Amiodarone Other (See Comments)    Pulmonary toxicity   Eszopiclone     Other Reaction(s): not effective     Outpatient Medications Prior to Visit  Medication Sig Dispense Refill   acetaminophen (TYLENOL) 500 MG tablet Take 500 mg by  mouth every 6 (six) hours as needed for headache, moderate pain or mild pain (pain).     albuterol (VENTOLIN HFA) 108 (90 Base) MCG/ACT inhaler Inhale 2 puffs into the lungs every 6 (six) hours as needed for wheezing or shortness of breath. 8 g 3   ALPRAZolam (XANAX) 0.5 MG tablet Take 0.25-0.5 mg by mouth 3 (three) times daily as needed (ear ringing.).     Azelaic Acid (FINACEA) 15 % gel Apply 1 Application topically 2 (two) times daily as needed (Face).     dapagliflozin propanediol (FARXIGA) 10 MG TABS tablet Take 1 tablet (10 mg total) by mouth daily before breakfast. 90 tablet 1   dofetilide (TIKOSYN) 250 MCG capsule Take 1 capsule (250 mcg total) by mouth 2 (two) times daily. 180  capsule 1   Ferrous Sulfate (IRON PO) Take 1 tablet by mouth daily as needed (Supplement).     finasteride (PROSCAR) 5 MG tablet Take 5 mg by mouth daily.     Finerenone (KERENDIA) 10 MG TABS Take 10 mg by mouth daily at 2 PM.     furosemide (LASIX) 40 MG tablet Take 1 tablet (40 mg total) by mouth daily as needed for fluid. (Patient taking differently: Take 40 mg by mouth daily.) 90 tablet 1   magnesium oxide (MAGOX 400) 400 (240 Mg) MG tablet Take 1 tablet (400 mg total) by mouth daily. 90 tablet 2   metoprolol succinate (TOPROL-XL) 100 MG 24 hr tablet Take 100 mg by mouth in the morning. Take with or immediately following a meal.     olmesartan (BENICAR) 40 MG tablet Take 40 mg by mouth daily.     pantoprazole (PROTONIX) 40 MG tablet Take 40 mg by mouth in the morning.  0   potassium chloride SA (KLOR-CON M) 20 MEQ tablet Take 1 tablet (20 mEq total) by mouth in the morning. 90 tablet 1   rosuvastatin (CRESTOR) 20 MG tablet TAKE 1 TABLET(20 MG) BY MOUTH DAILY (Patient taking differently: Take 20 mg by mouth daily.) 90 tablet 1   sertraline (ZOLOFT) 100 MG tablet Take 100 mg by mouth in the morning.  0   tamsulosin (FLOMAX) 0.4 MG CAPS capsule Take 0.4 mg by mouth daily after breakfast.     triamcinolone cream (KENALOG) 0.1 % Apply 1 Application topically 2 (two) times daily as needed (Rash).     XARELTO 20 MG TABS tablet TAKE 1 TABLET(20 MG) BY MOUTH DAILY WITH SUPPER (Patient taking differently: Take 20 mg by mouth daily with supper.) 90 tablet 1   No facility-administered medications prior to visit.    Review of Systems  Constitutional:  Negative for chills, diaphoresis, fever, malaise/fatigue and weight loss.  HENT:  Negative for congestion.   Respiratory:  Positive for shortness of breath. Negative for cough, hemoptysis, sputum production and wheezing.   Cardiovascular:  Negative for chest pain, palpitations and leg swelling.     Objective:   Vitals:   05/20/23 1050  BP: 106/62   Pulse: 69  Temp: 97.7 F (36.5 C)  TempSrc: Oral  SpO2: 95%  Weight: (!) 312 lb 6.4 oz (141.7 kg)  Height: 5\' 9"  (1.753 m)   SpO2: 95 % O2 Device: None (Room air)  Physical Exam: General: Well-appearing, no acute distress HENT: [place], AT Eyes: EOMI, no scleral icterus Respiratory: Clear to auscultation bilaterally.  No crackles, wheezing or rales Cardiovascular: RRR, -M/R/G, no JVD Extremities:No edema,-tenderness Neuro: AAO x4, CNII-XII grossly intact Psych: Normal mood, normal affect  Data  Reviewed:  Imaging: CTA 10/21/22 - Stable thoracic aneurysm. Bilateral upper lobe predominant ill-defined GGO, nonspecific CT Chest 01/26/23 - Pulmonary edema. Increased mediastinal and hilar lymph nodes, suspect reactive in setting of edema. Mild diffuse interlobular septal thickening. Prior bilateral upper and RML GGO less prominent. Resolved RML nodule  PFT: 02/02/23 FVC 2.14 (55%) FEV1 1.57 (58%) Ratio 79  TLC 63% DLCO 75% Interpretation: Moderately severe restrictive defect with mild reduction in DLCO   Labs:    Latest Ref Rng & Units 02/02/2023   11:26 AM 10/12/2022    2:07 PM 09/22/2021    8:55 AM  CBC  WBC 3.4 - 10.8 x10E3/uL 8.7   6.6   Hemoglobin 13.0 - 17.7 g/dL 16.1  09.6  04.5   Hematocrit 37.5 - 51.0 % 39.3   38.9   Platelets 150 - 450 x10E3/uL 319   270        Latest Ref Rng & Units 05/11/2023    9:04 AM 05/05/2023    1:58 AM 05/04/2023    3:15 AM  BMP  Glucose 70 - 99 mg/dL 409  811  914   BUN 8 - 27 mg/dL 15  18  8    Creatinine 0.76 - 1.27 mg/dL 7.82  9.56  2.13   BUN/Creat Ratio 10 - 24 13     Sodium 134 - 144 mmol/L 136  135  134   Potassium 3.5 - 5.2 mmol/L 5.5  4.2  3.9   Chloride 96 - 106 mmol/L 99  104  104   CO2 20 - 29 mmol/L 20  24  22    Calcium 8.6 - 10.2 mg/dL 9.7  9.0  8.9   Potassium borderline elevated.  Cardiac: Echocardiogram 02/24/23 - Normal EF. No WMA. RVSP 59.2 mm Hg. Normal RV function. Ascending aorta measuring 5 cm     ONO 03/10/23 SpO2  < 88% 4hr 44 min 47 sec. Nadir SpO2 78%. Baseline SpO2 89% Interpretation: Qualifies for oxygen Recommend 2L oxygen nightly   Assessment & Plan:   Discussion: 82 year old male never smoker with HTN, depression, anxiety, CKD III, atrial fibrillation, secondary hypercoagulable state, GERD, HLD who present for follow-up. Shortness of breath has improved with diuresis and weight loss. Discussed risk of hypoxemia however will re-evaluate since his acute presentation has improved and he is euvolemic on exam today. If persistent hypoxemia will need work-up for possible OSA   Shortness of breath - improved Lower extremity edema Moderately severe restrictive defect 2/2 chest wall compliance  --CONTINUE albuterol AS NEEDED for shortness of breath or wheezing --Diuresis per Cardiology --Monitor weight. Take lasix if 3lb weight gain in a day or 5lb weight gain in a week  Nocturnal hypoxemia - reports improved breathing, weight loss --RE-ORDER ONO on room air --If results are normal, can discontinue oxygen --If abnormal, can consider sleep apnea testing  Ascending aortic aneurysm 5 cm --Send message to Cardiology   Health Maintenance Immunization History  Administered Date(s) Administered   DTaP 02/12/2007   Influenza, High Dose Seasonal PF 09/19/2017, 08/17/2019   PFIZER(Purple Top)SARS-COV-2 Vaccination 01/06/2020, 01/27/2020, 01/27/2020, 09/24/2020   Pneumococcal Conjugate-13 02/12/2007   CT Lung Screen - not qualified  Orders Placed This Encounter  Procedures   Pulse oximetry, overnight    On room air    Standing Status:   Future    Standing Expiration Date:   05/19/2024   No orders of the defined types were placed in this encounter.   Return in about 1 year (  around 05/19/2024).  I have spent a total time of 32-minutes on the day of the appointment including chart review, data review, collecting history, coordinating care and discussing medical diagnosis and plan with the  patient/family. Past medical history, allergies, medications were reviewed. Pertinent imaging, labs and tests included in this note have been reviewed and interpreted independently by me.  Joseph [name] Mechele Collin, MD [facility] 05/20/2023 11:52 AM  Office Number [phone]

## 2023-05-22 DIAGNOSIS — G4733 Obstructive sleep apnea (adult) (pediatric): Secondary | ICD-10-CM | POA: Diagnosis not present

## 2023-05-27 DIAGNOSIS — R0902 Hypoxemia: Secondary | ICD-10-CM | POA: Diagnosis not present

## 2023-05-27 DIAGNOSIS — G4730 Sleep apnea, unspecified: Secondary | ICD-10-CM | POA: Diagnosis not present

## 2023-05-31 ENCOUNTER — Encounter (HOSPITAL_BASED_OUTPATIENT_CLINIC_OR_DEPARTMENT_OTHER): Payer: Self-pay | Admitting: Pulmonary Disease

## 2023-06-07 DIAGNOSIS — H25812 Combined forms of age-related cataract, left eye: Secondary | ICD-10-CM | POA: Diagnosis not present

## 2023-06-07 DIAGNOSIS — H2512 Age-related nuclear cataract, left eye: Secondary | ICD-10-CM | POA: Diagnosis not present

## 2023-06-10 ENCOUNTER — Ambulatory Visit: Payer: Medicare PPO | Admitting: Cardiology

## 2023-06-17 NOTE — Telephone Encounter (Signed)
Overnight Oximetry Testing  ONO 05/27/23 SpO2 < 88% 1 hour 11 min 59 sec . Nadir SpO2 76%. Baseline SpO2 91% Interpretation: Qualifies for oxygen OK to reduce from 2L to 1L oxygen nightly   Staff please change order to 1L O2 via nasal cannula nightly

## 2023-06-18 ENCOUNTER — Other Ambulatory Visit (HOSPITAL_BASED_OUTPATIENT_CLINIC_OR_DEPARTMENT_OTHER): Payer: Self-pay

## 2023-06-18 DIAGNOSIS — G4734 Idiopathic sleep related nonobstructive alveolar hypoventilation: Secondary | ICD-10-CM

## 2023-06-22 DIAGNOSIS — G4733 Obstructive sleep apnea (adult) (pediatric): Secondary | ICD-10-CM | POA: Diagnosis not present

## 2023-07-08 DIAGNOSIS — I872 Venous insufficiency (chronic) (peripheral): Secondary | ICD-10-CM | POA: Diagnosis not present

## 2023-07-08 DIAGNOSIS — I8312 Varicose veins of left lower extremity with inflammation: Secondary | ICD-10-CM | POA: Diagnosis not present

## 2023-07-08 DIAGNOSIS — D692 Other nonthrombocytopenic purpura: Secondary | ICD-10-CM | POA: Diagnosis not present

## 2023-07-08 DIAGNOSIS — L718 Other rosacea: Secondary | ICD-10-CM | POA: Diagnosis not present

## 2023-07-08 DIAGNOSIS — Z85828 Personal history of other malignant neoplasm of skin: Secondary | ICD-10-CM | POA: Diagnosis not present

## 2023-07-08 DIAGNOSIS — I8311 Varicose veins of right lower extremity with inflammation: Secondary | ICD-10-CM | POA: Diagnosis not present

## 2023-07-08 DIAGNOSIS — L570 Actinic keratosis: Secondary | ICD-10-CM | POA: Diagnosis not present

## 2023-07-22 DIAGNOSIS — G4733 Obstructive sleep apnea (adult) (pediatric): Secondary | ICD-10-CM | POA: Diagnosis not present

## 2023-07-28 DIAGNOSIS — N183 Chronic kidney disease, stage 3 unspecified: Secondary | ICD-10-CM | POA: Diagnosis not present

## 2023-07-28 DIAGNOSIS — E785 Hyperlipidemia, unspecified: Secondary | ICD-10-CM | POA: Diagnosis not present

## 2023-07-28 DIAGNOSIS — I10 Essential (primary) hypertension: Secondary | ICD-10-CM | POA: Diagnosis not present

## 2023-08-05 DIAGNOSIS — F325 Major depressive disorder, single episode, in full remission: Secondary | ICD-10-CM | POA: Diagnosis not present

## 2023-08-05 DIAGNOSIS — N1831 Chronic kidney disease, stage 3a: Secondary | ICD-10-CM | POA: Diagnosis not present

## 2023-08-05 DIAGNOSIS — I482 Chronic atrial fibrillation, unspecified: Secondary | ICD-10-CM | POA: Diagnosis not present

## 2023-08-05 DIAGNOSIS — D6869 Other thrombophilia: Secondary | ICD-10-CM | POA: Diagnosis not present

## 2023-08-05 DIAGNOSIS — E785 Hyperlipidemia, unspecified: Secondary | ICD-10-CM | POA: Diagnosis not present

## 2023-08-05 DIAGNOSIS — Z1331 Encounter for screening for depression: Secondary | ICD-10-CM | POA: Diagnosis not present

## 2023-08-05 DIAGNOSIS — I712 Thoracic aortic aneurysm, without rupture, unspecified: Secondary | ICD-10-CM | POA: Diagnosis not present

## 2023-08-05 DIAGNOSIS — Z0000 Encounter for general adult medical examination without abnormal findings: Secondary | ICD-10-CM | POA: Diagnosis not present

## 2023-08-05 DIAGNOSIS — I10 Essential (primary) hypertension: Secondary | ICD-10-CM | POA: Diagnosis not present

## 2023-08-05 DIAGNOSIS — R7989 Other specified abnormal findings of blood chemistry: Secondary | ICD-10-CM | POA: Diagnosis not present

## 2023-08-12 ENCOUNTER — Ambulatory Visit: Payer: Medicare PPO | Admitting: Cardiology

## 2023-08-22 DIAGNOSIS — G4733 Obstructive sleep apnea (adult) (pediatric): Secondary | ICD-10-CM | POA: Diagnosis not present

## 2023-08-23 ENCOUNTER — Ambulatory Visit: Payer: Medicare PPO | Admitting: Cardiology

## 2023-08-25 ENCOUNTER — Other Ambulatory Visit: Payer: Self-pay

## 2023-08-25 ENCOUNTER — Other Ambulatory Visit: Payer: Self-pay | Admitting: *Deleted

## 2023-08-25 DIAGNOSIS — I480 Paroxysmal atrial fibrillation: Secondary | ICD-10-CM

## 2023-08-25 MED ORDER — RIVAROXABAN 20 MG PO TABS
20.0000 mg | ORAL_TABLET | Freq: Every day | ORAL | 1 refills | Status: DC
Start: 2023-08-25 — End: 2023-08-25

## 2023-08-25 MED ORDER — RIVAROXABAN 20 MG PO TABS
20.0000 mg | ORAL_TABLET | Freq: Every day | ORAL | 1 refills | Status: DC
Start: 2023-08-25 — End: 2024-02-17

## 2023-08-25 NOTE — Telephone Encounter (Signed)
Prescription refill request for Xarelto received.  Indication: Last office visit: Weight: Age: 82 Scr: 1.19 epic 05/11/23 CrCl: 96 ml/min

## 2023-09-06 DIAGNOSIS — L820 Inflamed seborrheic keratosis: Secondary | ICD-10-CM | POA: Diagnosis not present

## 2023-09-06 DIAGNOSIS — L718 Other rosacea: Secondary | ICD-10-CM | POA: Diagnosis not present

## 2023-09-06 DIAGNOSIS — L570 Actinic keratosis: Secondary | ICD-10-CM | POA: Diagnosis not present

## 2023-09-06 DIAGNOSIS — I8312 Varicose veins of left lower extremity with inflammation: Secondary | ICD-10-CM | POA: Diagnosis not present

## 2023-09-06 DIAGNOSIS — I8311 Varicose veins of right lower extremity with inflammation: Secondary | ICD-10-CM | POA: Diagnosis not present

## 2023-09-06 DIAGNOSIS — I872 Venous insufficiency (chronic) (peripheral): Secondary | ICD-10-CM | POA: Diagnosis not present

## 2023-09-06 DIAGNOSIS — D485 Neoplasm of uncertain behavior of skin: Secondary | ICD-10-CM | POA: Diagnosis not present

## 2023-09-06 DIAGNOSIS — D1801 Hemangioma of skin and subcutaneous tissue: Secondary | ICD-10-CM | POA: Diagnosis not present

## 2023-09-06 DIAGNOSIS — L309 Dermatitis, unspecified: Secondary | ICD-10-CM | POA: Diagnosis not present

## 2023-09-06 DIAGNOSIS — Z85828 Personal history of other malignant neoplasm of skin: Secondary | ICD-10-CM | POA: Diagnosis not present

## 2023-09-14 ENCOUNTER — Ambulatory Visit: Payer: Medicare PPO | Admitting: Cardiology

## 2023-09-14 ENCOUNTER — Encounter: Payer: Self-pay | Admitting: Cardiology

## 2023-09-14 VITALS — BP 143/75 | HR 64 | Resp 15 | Ht 69.0 in | Wt 303.0 lb

## 2023-09-14 DIAGNOSIS — N1831 Chronic kidney disease, stage 3a: Secondary | ICD-10-CM

## 2023-09-14 DIAGNOSIS — I480 Paroxysmal atrial fibrillation: Secondary | ICD-10-CM | POA: Diagnosis not present

## 2023-09-14 DIAGNOSIS — I7121 Aneurysm of the ascending aorta, without rupture: Secondary | ICD-10-CM

## 2023-09-14 DIAGNOSIS — I10 Essential (primary) hypertension: Secondary | ICD-10-CM | POA: Diagnosis not present

## 2023-09-14 DIAGNOSIS — Z6841 Body Mass Index (BMI) 40.0 and over, adult: Secondary | ICD-10-CM | POA: Diagnosis not present

## 2023-09-14 NOTE — Progress Notes (Signed)
Primary Physician/Referring:  Joseph Brunette, MD  Patient ID: Joseph [name], male    DOB: 1941/10/06, 82 y.o.   MRN: [id number]  Chief Complaint  Patient presents with   Atrial Fibrillation   Congestive Heart Failure   Hypertension   Follow-up    3 month   HPI:    Joseph [name]  is a 82 y.o. Caucasian male patient with paroxysmal atrial fibrillation SP atrial fibrillation ablation on 2013, ascending thoracic aortic aneurysm stable at around 5 cm since 2009 with good correlation with echocardiogram, primary hypertension, hypercholesterolemia, morbid obesity, negative sleep study in the past, chronic diastolic heart failure, presently on Tikosyn for PAF.  This is a 61-month office visit.  States that he has maintained sinus rhythm, has had breakthrough atrial fibrillation according to his smart watch but does not feel any symptoms but usually last short time.  Overall feels well, dyspnea is improved. Denies PND or orthopnea, denies weight gain, denies leg edema.  He has been extremely careful with his diet.  He continues to lose weight.   Past Medical History:  Diagnosis Date   Allergy    Atrial fibrillation (HCC) 11/2008   Atypical atrial flutter (HCC) 07/13/2014   Colon polyp    Depression    Diverticulosis    Dysrhythmia    GERD (gastroesophageal reflux disease)    Headache(784.0)    Hyperlipidemia    Hypertension    Panic attacks    PONV (postoperative nausea and vomiting)    Thoracic aneurysm    Past Surgical History:  Procedure Laterality Date   ablataion  07/06/2012   APPENDECTOMY  1958   BIOPSY  12/03/2022   Procedure: BIOPSY;  Surgeon: Meryl Dare, MD;  Location: [facility];  Service: Gastroenterology;;   CARDIAC CATHETERIZATION  2009   CARDIOVERSION  03/18/2012   Procedure: CARDIOVERSION;  Surgeon: Lesleigh Noe, MD;  Location: [facility] OR;  Service: Cardiovascular;  Laterality: N/A;   CARDIOVERSION N/A 08/23/2014   Procedure: CARDIOVERSION;  Surgeon: Lesleigh Noe, MD;  Location: [facility] ENDOSCOPY;  Service: Cardiovascular;  Laterality: N/A;   CARDIOVERSION N/A 04/16/2023   Procedure: CARDIOVERSION;  Surgeon: Yates Decamp, MD;  Location: [facility] INVASIVE CV LAB;  Service: Cardiovascular;  Laterality: N/A;   COLONOSCOPY WITH PROPOFOL N/A 12/03/2022   Procedure: COLONOSCOPY WITH PROPOFOL;  Surgeon: Meryl Dare, MD;  Location: [facility] ENDOSCOPY;  Service: Gastroenterology;  Laterality: N/A;   ESOPHAGOGASTRODUODENOSCOPY (EGD) WITH PROPOFOL N/A 12/03/2022   Procedure: ESOPHAGOGASTRODUODENOSCOPY (EGD) WITH PROPOFOL;  Surgeon: Meryl Dare, MD;  Location: [facility] ENDOSCOPY;  Service: Gastroenterology;  Laterality: N/A;   INGUINAL HERNIA REPAIR     right   LUMBAR LAMINECTOMY/DECOMPRESSION MICRODISCECTOMY Bilateral 02/08/2015   Procedure: Laminectomy and Foraminotomy - bilateral - Lumbar two-three, three-four, left lumbar four-five with resection of synovial cyst ;  Surgeon: Temple Pacini, MD;  Location: [facility] NEURO ORS;  Service: Neurosurgery;  Laterality: Bilateral;   POLYPECTOMY  12/03/2022   Procedure: POLYPECTOMY;  Surgeon: Meryl Dare, MD;  Location: [facility];  Service: Gastroenterology;;   spinal tap     VASECTOMY     Family History  Problem Relation Age of Onset   Heart disease Mother 67       mother   Heart attack Mother    Lung cancer Father    Heart disease Father    Kidney disease Brother 15       died age 74 heart attack   Heart attack Brother  Heart disease Maternal Grandfather    Colon cancer Neg Hx     Social History   Tobacco Use   Smoking status: Never   Smokeless tobacco: Never   Tobacco comments:    Never smoke 11/23/22  Substance Use Topics   Alcohol use: No   Marital Status: Married  ROS  Review of Systems  Cardiovascular:  Positive for dyspnea on exertion. Negative for chest pain and leg swelling.   Objective      09/14/2023   12:47 PM 05/20/2023   10:50 AM 05/13/2023   10:38 AM  Vitals with BMI  Height 5\' 9"  5'  9" 5\' 9"   Weight 303 lbs 312 lbs 6 oz 312 lbs  BMI 44.72 46.11 46.05  Systolic 143 106 161  Diastolic 75 62 72  Pulse 64 69 60   SpO2: 95 %  Physical Exam Constitutional:      Appearance: He is morbidly obese.  Neck:     Vascular: No carotid bruit or JVD (Short neck).  Cardiovascular:     Rate and Rhythm: Normal rate and regular rhythm.     Pulses:          Dorsalis pedis pulses are 1+ on the right side and 1+ on the left side.       Posterior tibial pulses are 1+ on the right side and 1+ on the left side.     Heart sounds: No murmur heard. Pulmonary:     Effort: Pulmonary effort is normal.     Breath sounds: Normal breath sounds.  Abdominal:     General: Abdomen is protuberant. Bowel sounds are normal.     Palpations: Abdomen is soft.  Musculoskeletal:     Right lower leg: Edema (Trace) present.     Left lower leg: Edema (Trace) present.  Skin:    Capillary Refill: Capillary refill takes less than 2 seconds.    Laboratory examination:   Recent Labs    05/03/23 0213 05/03/23 0546 05/04/23 0315 05/05/23 0158 05/11/23 0904  NA 135  --  134* 135 136  K 3.7   < > 3.9 4.2 5.5*  CL 104  --  104 104 99  CO2 22  --  22 24 20   GLUCOSE 107*  --  107* 111* 104*  BUN 17  --  8 18 15   CREATININE 1.35*  --  1.05 1.36* 1.19  CALCIUM 9.2  --  8.9 9.0 9.7  GFRNONAA 53*  --  >60 52*  --    < > = values in this interval not displayed.   Lab Results  Component Value Date   GLUCOSE 104 (H) 05/11/2023   NA 136 05/11/2023   K 5.5 (H) 05/11/2023   CL 99 05/11/2023   CO2 20 05/11/2023   BUN 15 05/11/2023   CREATININE 1.19 05/11/2023   EGFR 61 05/11/2023   CALCIUM 9.7 05/11/2023   PROT 6.9 11/01/2019   ALBUMIN 4.7 11/01/2019   BILITOT 0.4 11/01/2019   ALKPHOS 66 11/01/2019   AST 21 11/01/2019   ALT 20 11/01/2019   ANIONGAP 7 05/05/2023        Latest Ref Rng & Units 02/02/2023   11:26 AM 10/12/2022    2:07 PM 09/22/2021    8:55 AM  CBC  WBC 3.4 - 10.8 x10E3/uL 8.7    6.6   Hemoglobin 13.0 - 17.7 g/dL 09.6  04.5  40.9   Hematocrit 37.5 - 51.0 % 39.3   38.9   Platelets  150 - 450 x10E3/uL 319   270     TSH Recent Labs    10/12/22 1407  TSH 2.890   ProBNP (last 3 results) Recent Labs    03/25/23 1052 04/30/23 0936 05/11/23 0904  PROBNP 1,124* 1,236* 316    External labs:   Labs 03/03/2023:  Serum glucose 97 mg, BUN 19, creatinine 1.38, EGFR 50 to mL, potassium 4.2.  LFTs normal.  Iron studies reveal mild decrease in iron.  Labs 12/29/2022:  Hb 10.1/HCT 32.9, platelets 346, microcytic indicis.  Cholesterol, total 129.000 m 07/23/2022 HDL 62.000 mg 07/23/2022 LDL 51.000 mg 07/23/2022 Triglycerides 76.000 mg 07/23/2022  Radiology:   CT angiogram chest 01/26/2023: 1. The previous nodular ground-glass opacities have nearly resolved compatible with resolving infection. 2. Cardiomegaly. New pulmonary edema since 10/21/2022. Trace right pleural effusion. 3. Unchanged 49 mm ascending aortic aneurysm. Ascending thoracic aortic aneurysm. Recommend semi-annual imaging followup by CTA or MRA and referral to cardiothoracic surgery if not already obtained. Aortic aneurysm stable since 2017.  Chest x-ray two-view 02/28/2023: Cardiac silhouette is prominent. There is pulmonary interstitial prominence with vascular congestion. No focal consolidation. No pneumothorax or pleural effusion identified. There are thoracic degenerative changes. IMPRESSION: Findings suggest CHF.  PFTs 02/02/2023: Moderately severe restrictive defect with mild reduction in DLCO  Cardiac Studies:   Left Heart Catheterization 12/13/2008:  1. Hemodynamic data:      a.     Aortic pressure 126/76.      b.     Left ventricular pressure 128/40 mmHg.  2. Left ventriculography:  LV cavity size and function are normal.  EF is 60%.  No obvious mitral regurgitation is noted.  3. Coronary angiography.      a.     Left main coronary artery:  Normal.      b.     Left anterior descending  coronary artery:  Normal.  The vessel gives origin to 2 diagonal branches also normal.  The LAD is transapical.      c.     Circumflex artery:  The circumflex coronary artery gives origin to 3 marginals, they are normal.      d.     Right coronary artery:  The right coronary artery is normal. The PDA is large and normal.  Echocardiogram 02/24/2023: 1. Left ventricular ejection fraction, by estimation, is 60 to 65%. The left ventricle has normal function. The left ventricle has no regional wall motion abnormalities. There is mild left ventricular hypertrophy. Left ventricular diastolic parameters  were normal.  2. Right ventricular systolic function is normal. The right ventricular size is normal. There is moderately elevated pulmonary artery systolic pressure. The estimated right ventricular systolic pressure is 59.2 mmHg.  3. Left atrial size was mildly dilated.  4. Right atrial size was mildly dilated.  5. The mitral valve is normal in structure. No evidence of mitral valve regurgitation. No evidence of mitral stenosis.  6. The aortic valve is normal in structure. Aortic valve regurgitation is not visualized. No aortic stenosis is present.  7. There is severe dilatation of the ascending aorta, measuring 50 mm.  8. The inferior vena cava is dilated in size with <50% respiratory variability, suggesting right atrial pressure of 15 mmHg.  Direct current cardioversion 04/16/2023 11:18 AM  EKG:   EKG 09/14/2023: Sinus rhythm with first-degree AV block at the rate of 55 bpm, left anterior fascicular block.  Incomplete right bundle branch block.  Poor R wave progression, anterolateral infarct old.  Compared to 05/13/2023,  no change.  Compared to 04/29/2023, atrial fibrillation not present.  Medications and allergies   Allergies  Allergen Reactions   Amiodarone Other (See Comments)    Pulmonary toxicity   Eszopiclone     Other Reaction(s): not effective    Medication list    Current Outpatient  Medications:    acetaminophen (TYLENOL) 500 MG tablet, Take 500 mg by mouth every 6 (six) hours as needed for headache, moderate pain or mild pain (pain)., Disp: , Rfl:    ALPRAZolam (XANAX) 0.5 MG tablet, Take 0.25-0.5 mg by mouth 3 (three) times daily as needed (ear ringing.)., Disp: , Rfl:    Azelaic Acid (FINACEA) 15 % gel, Apply 1 Application topically 2 (two) times daily as needed (Face)., Disp: , Rfl:    dapagliflozin propanediol (FARXIGA) 10 MG TABS tablet, Take 1 tablet (10 mg total) by mouth daily before breakfast., Disp: 90 tablet, Rfl: 1   dofetilide (TIKOSYN) 250 MCG capsule, Take 1 capsule (250 mcg total) by mouth 2 (two) times daily., Disp: 180 capsule, Rfl: 1   Ferrous Sulfate (IRON PO), Take 1 tablet by mouth daily as needed (Supplement)., Disp: , Rfl:    finasteride (PROSCAR) 5 MG tablet, Take 5 mg by mouth daily., Disp: , Rfl:    furosemide (LASIX) 40 MG tablet, Take 1 tablet (40 mg total) by mouth daily as needed for fluid. (Patient taking differently: Take 40 mg by mouth daily.), Disp: 90 tablet, Rfl: 1   magnesium oxide (MAGOX 400) 400 (240 Mg) MG tablet, Take 1 tablet (400 mg total) by mouth daily., Disp: 90 tablet, Rfl: 2   metoprolol succinate (TOPROL-XL) 100 MG 24 hr tablet, Take 100 mg by mouth in the morning. Take with or immediately following a meal., Disp: , Rfl:    olmesartan (BENICAR) 40 MG tablet, Take 40 mg by mouth daily., Disp: , Rfl:    pantoprazole (PROTONIX) 40 MG tablet, Take 40 mg by mouth in the morning., Disp: , Rfl: 0   potassium chloride SA (KLOR-CON M) 20 MEQ tablet, Take 1 tablet (20 mEq total) by mouth in the morning., Disp: 90 tablet, Rfl: 1   rivaroxaban (XARELTO) 20 MG TABS tablet, Take 1 tablet (20 mg total) by mouth daily with supper. TAKE 1 TABLET(20 MG) BY MOUTH DAILY WITH SUPPER, Disp: 90 tablet, Rfl: 1   rosuvastatin (CRESTOR) 20 MG tablet, TAKE 1 TABLET(20 MG) BY MOUTH DAILY (Patient taking differently: Take 20 mg by mouth daily.), Disp: 90  tablet, Rfl: 1   sertraline (ZOLOFT) 100 MG tablet, Take 100 mg by mouth in the morning., Disp: , Rfl: 0   tamsulosin (FLOMAX) 0.4 MG CAPS capsule, Take 0.4 mg by mouth daily after breakfast., Disp: , Rfl:    triamcinolone cream (KENALOG) 0.1 %, Apply 1 Application topically 2 (two) times daily as needed (Rash)., Disp: , Rfl:    albuterol (VENTOLIN HFA) 108 (90 Base) MCG/ACT inhaler, Inhale 2 puffs into the lungs every 6 (six) hours as needed for wheezing or shortness of breath. (Patient not taking: Reported on 09/14/2023), Disp: 8 g, Rfl: 3   Assessment     ICD-10-CM   1. Paroxysmal atrial fibrillation (HCC)  I48.0 EKG 12-Lead    ECHOCARDIOGRAM COMPLETE    2. Stage 3a chronic kidney disease (HCC)  N18.31     3. Essential hypertension  I10     4. Class 3 severe obesity due to excess calories with serious comorbidity and body mass index (BMI) of 40.0 to 44.9 in  adult (HCC)  E66.01    Z68.41     5. Aneurysm of ascending aorta without rupture (HCC)  I71.21 ECHOCARDIOGRAM COMPLETE       Orders Placed This Encounter  Procedures   EKG 12-Lead   ECHOCARDIOGRAM COMPLETE    Standing Status:   Future    Standing Expiration Date:   09/13/2024    Scheduling Instructions:     Prior to next office visit    Order Specific Question:   Where should this test be performed    Answer:   [facility])    Order Specific Question:   Does the patient weigh less than or greater than 250 lbs?    Answer:   Patient weighs greater than 250 lbs    Comments:   302    Order Specific Question:   Perflutren DEFINITY (image enhancing agent) should be administered unless hypersensitivity or allergy exist    Answer:   Administer Perflutren    Order Specific Question:   Reason for exam-Echo    Answer:   Other-Full Diagnosis List    Order Specific Question:   Reason for exam-Echo    Answer:   Atrial Fibrillation  I48.91    Order Specific Question:   Full ICD-10/Reason for Exam    Answer:    Ascending aortic aneurysm (HCC) [865784]    No orders of the defined types were placed in this encounter.   Medications Discontinued During This Encounter  Medication Reason   [facility]) 10 MG TABS      Recommendations:   DAYLE BAGSHAW is a 82 y.o. Caucasian male patient with paroxysmal atrial fibrillation SP atrial fibrillation ablation on 2013, ascending thoracic aortic aneurysm stable at around 5 cm since 2009 with good correlation with echocardiogram, primary hypertension, hypercholesterolemia, morbid obesity, negative sleep study in the past, chronic diastolic heart failure, presently on Tikosyn for PAF.  This is a 76-month office visit.  1. Paroxysmal atrial fibrillation [facility]) Patient is maintaining sinus rhythm although he had 1 breakthrough atrial fibrillation, noted on his Apple Watch which I also reviewed on 08/17/2023.  Patient now states that he does not have any symptoms when he goes into A-fib but mostly maintaining sinus rhythm.  He has continued to lose weight and is motivated to continue doing the strict diet that he is on and we also discussed regarding starting exercise therapy as well.  Due to back pain he has not been able to exercise but is now planning to start using his stationary bicycle.  - EKG 12-Lead - ECHOCARDIOGRAM COMPLETE; Future  2. Stage 3a chronic kidney disease (HCC) 's labs reviewed, stable stage IIIa chronic kidney disease.  3. Essential hypertension Blood pressure is also well-controlled, presently on metoprolol succinate 100 mg daily.  4. Class 3 severe obesity due to excess calories with serious comorbidity and body mass index (BMI) of 40.0 to 44.9 in adult [facility]) Fortunately he has started to lose weight, since last office visit 3 months ago he has lost additional 8 to 9 pounds in weight.  5. Aneurysm of ascending aorta without rupture (HCC) He has a very large ascending aortic aneurysm measuring close to 5 cm.  Excellent correlation  with echocardiogram and CT scan.  His aortic aneurysm has remained stable since 2009 hence we will skip doing CT scan of the chest for this year and will repeat echocardiogram in 6 months and I will see him back at that time.  - ECHOCARDIOGRAM COMPLETE; Future  1. Paroxysmal atrial fibrillation (HCC) Patient is maintaining sinus rhythm, he is now on Tikosyn 250 mcg p.o. twice daily, I refilled the prescription.  Will also start him on magnesium oxide supplement.  - EKG 12-Lead - dofetilide (TIKOSYN) 250 MCG capsule; Take 1 capsule (250 mcg total) by mouth 2 (two) times daily.  Dispense: 180 capsule; Refill: 1 - magnesium oxide (MAGOX 400) 400 (240 Mg) MG tablet; Take 1 tablet (400 mg total) by mouth daily.  Dispense: 90 tablet; Refill: 2  2. Chronic diastolic (congestive) heart failure (HCC) His heart failure symptoms are improved significantly, leg edema is essentially resolved except for ankle edema, he is now able to walk without help of a walker.  NT proBNP has reduced significantly as well.  3. Stage 3a chronic kidney disease (HCC) Stage IIIa chronic kidney disease has remained stable in spite of aggressive diuresis and also use of guideline directed medical therapy, no changes in the medications were done today as she is doing well and stable from cardiac standpoint.  He is presently on [place] however he is having difficulty in getting this approved with insurance, will try to do prior authorization.   4. High risk medication use Patient is presently on Tikosyn, QT interval has remained stable.  Advised him to change furosemide to taking it every other day or on a as needed basis depending upon leg edema and dyspnea and weight gain.  He has made significant lifestyle changes and appears to be very motivated and weight loss as well.  I would like to see him back in 6 months for follow-up.   Yates Decamp, MD, [facility] 09/14/2023, 2:40 PM Office: [phone]

## 2023-09-22 DIAGNOSIS — G4733 Obstructive sleep apnea (adult) (pediatric): Secondary | ICD-10-CM | POA: Diagnosis not present

## 2023-09-30 DIAGNOSIS — N401 Enlarged prostate with lower urinary tract symptoms: Secondary | ICD-10-CM | POA: Diagnosis not present

## 2023-09-30 DIAGNOSIS — R3912 Poor urinary stream: Secondary | ICD-10-CM | POA: Diagnosis not present

## 2023-09-30 DIAGNOSIS — N529 Male erectile dysfunction, unspecified: Secondary | ICD-10-CM | POA: Diagnosis not present

## 2023-10-05 ENCOUNTER — Encounter (HOSPITAL_BASED_OUTPATIENT_CLINIC_OR_DEPARTMENT_OTHER): Payer: Self-pay | Admitting: Pulmonary Disease

## 2023-10-05 DIAGNOSIS — G4734 Idiopathic sleep related nonobstructive alveolar hypoventilation: Secondary | ICD-10-CM

## 2023-10-14 ENCOUNTER — Encounter (HOSPITAL_BASED_OUTPATIENT_CLINIC_OR_DEPARTMENT_OTHER): Payer: Self-pay | Admitting: Pulmonary Disease

## 2023-10-15 NOTE — Telephone Encounter (Signed)
Order was placed on 10/06/23 and confirmed by [employer] on 10/9. I sent urgent message for [employer] to check on this issue

## 2023-11-22 ENCOUNTER — Other Ambulatory Visit: Payer: Self-pay | Admitting: Cardiology

## 2023-11-22 DIAGNOSIS — I480 Paroxysmal atrial fibrillation: Secondary | ICD-10-CM

## 2023-12-05 ENCOUNTER — Other Ambulatory Visit: Payer: Self-pay | Admitting: Cardiology

## 2023-12-05 DIAGNOSIS — I5033 Acute on chronic diastolic (congestive) heart failure: Secondary | ICD-10-CM

## 2024-02-09 DIAGNOSIS — Z23 Encounter for immunization: Secondary | ICD-10-CM | POA: Diagnosis not present

## 2024-02-09 DIAGNOSIS — Z7901 Long term (current) use of anticoagulants: Secondary | ICD-10-CM | POA: Diagnosis not present

## 2024-02-09 DIAGNOSIS — I10 Essential (primary) hypertension: Secondary | ICD-10-CM | POA: Diagnosis not present

## 2024-02-09 DIAGNOSIS — I480 Paroxysmal atrial fibrillation: Secondary | ICD-10-CM | POA: Diagnosis not present

## 2024-02-09 DIAGNOSIS — K219 Gastro-esophageal reflux disease without esophagitis: Secondary | ICD-10-CM | POA: Diagnosis not present

## 2024-02-09 DIAGNOSIS — F419 Anxiety disorder, unspecified: Secondary | ICD-10-CM | POA: Diagnosis not present

## 2024-02-09 DIAGNOSIS — E785 Hyperlipidemia, unspecified: Secondary | ICD-10-CM | POA: Diagnosis not present

## 2024-02-09 DIAGNOSIS — D6869 Other thrombophilia: Secondary | ICD-10-CM | POA: Diagnosis not present

## 2024-02-09 DIAGNOSIS — F325 Major depressive disorder, single episode, in full remission: Secondary | ICD-10-CM | POA: Diagnosis not present

## 2024-02-09 LAB — COMPREHENSIVE METABOLIC PANEL WITH GFR: EGFR (Non-African Amer.): 70

## 2024-02-09 LAB — HEMOGLOBIN A1C: A1c: 5.7

## 2024-02-17 ENCOUNTER — Other Ambulatory Visit: Payer: Self-pay | Admitting: Cardiology

## 2024-02-17 DIAGNOSIS — I480 Paroxysmal atrial fibrillation: Secondary | ICD-10-CM

## 2024-02-17 NOTE — Telephone Encounter (Signed)
 Prescription refill request for Xarelto received.  Indication: Afib   Last office visit: 09/14/23 [name])  Weight: 137.4kg Age: 83 Scr: 1.19 (05/11/23)  CrCl: 93.53ml/min  Appropriate dose. Refill sent.

## 2024-03-06 DIAGNOSIS — L814 Other melanin hyperpigmentation: Secondary | ICD-10-CM | POA: Diagnosis not present

## 2024-03-06 DIAGNOSIS — D1801 Hemangioma of skin and subcutaneous tissue: Secondary | ICD-10-CM | POA: Diagnosis not present

## 2024-03-06 DIAGNOSIS — L309 Dermatitis, unspecified: Secondary | ICD-10-CM | POA: Diagnosis not present

## 2024-03-06 DIAGNOSIS — L820 Inflamed seborrheic keratosis: Secondary | ICD-10-CM | POA: Diagnosis not present

## 2024-03-06 DIAGNOSIS — L718 Other rosacea: Secondary | ICD-10-CM | POA: Diagnosis not present

## 2024-03-06 DIAGNOSIS — L821 Other seborrheic keratosis: Secondary | ICD-10-CM | POA: Diagnosis not present

## 2024-03-06 DIAGNOSIS — Z85828 Personal history of other malignant neoplasm of skin: Secondary | ICD-10-CM | POA: Diagnosis not present

## 2024-03-06 DIAGNOSIS — L570 Actinic keratosis: Secondary | ICD-10-CM | POA: Diagnosis not present

## 2024-03-08 ENCOUNTER — Encounter: Payer: Self-pay | Admitting: Cardiology

## 2024-03-08 ENCOUNTER — Ambulatory Visit (HOSPITAL_COMMUNITY): Payer: Medicare PPO | Attending: Cardiology

## 2024-03-08 DIAGNOSIS — I480 Paroxysmal atrial fibrillation: Secondary | ICD-10-CM | POA: Diagnosis not present

## 2024-03-08 DIAGNOSIS — I7121 Aneurysm of the ascending aorta, without rupture: Secondary | ICD-10-CM | POA: Diagnosis not present

## 2024-03-08 LAB — ECHOCARDIOGRAM COMPLETE
Calc EF: 60.9 %
MV M vel: 4.43 m/s
MV Peak grad: 78.5 mmHg
Radius: 0.4 cm
S' Lateral: 3.2 cm
Single Plane A2C EF: 58.1 %
Single Plane A4C EF: 64.7 %

## 2024-03-08 NOTE — Progress Notes (Signed)
 Patient is aware of aneurysm as I spoke to him during echo and will discuss more on his visit with me

## 2024-03-13 NOTE — Progress Notes (Signed)
 Please consider for the Hermes HF trial

## 2024-03-15 ENCOUNTER — Ambulatory Visit: Payer: Medicare PPO | Admitting: Cardiology

## 2024-03-21 ENCOUNTER — Encounter: Payer: Self-pay | Admitting: Cardiology

## 2024-03-21 ENCOUNTER — Ambulatory Visit: Payer: Medicare PPO | Attending: Cardiology | Admitting: Cardiology

## 2024-03-21 VITALS — BP 130/72 | HR 62 | Ht 69.0 in | Wt 320.6 lb

## 2024-03-21 DIAGNOSIS — I10 Essential (primary) hypertension: Secondary | ICD-10-CM | POA: Diagnosis not present

## 2024-03-21 DIAGNOSIS — I480 Paroxysmal atrial fibrillation: Secondary | ICD-10-CM

## 2024-03-21 DIAGNOSIS — D6869 Other thrombophilia: Secondary | ICD-10-CM

## 2024-03-21 DIAGNOSIS — I7121 Aneurysm of the ascending aorta, without rupture: Secondary | ICD-10-CM

## 2024-03-21 NOTE — Progress Notes (Signed)
 Cardiology Office Note:  .   Date:  03/21/2024  ID:  Joseph [name], DOB 13-Nov-1941, MRN [id number] PCP: Merri Brunette, MD  [facility] Providers Cardiologist:  Yates Decamp, MD   History of Present Illness: .   Joseph [name] is a 83 y.o. Caucasian male patient with paroxysmal atrial fibrillation SP atrial fibrillation ablation on 2013, ascending thoracic aortic aneurysm stable at around 5 cm since 2009 with good correlation with echocardiogram, primary hypertension, hypercholesterolemia, morbid obesity, negative sleep study in the past, chronic diastolic heart failure, presently on Tikosyn for PAF.  Discussed the use of AI scribe software for clinical note transcription with the patient, who gave verbal consent to proceed.  History of Present Illness   Joseph [name], a patient with a history of paroxysmal atrial fibrillation (AFib) and an ascending aortic aneurysm, presents for a routine check-up. He reports feeling well overall and does not feel his AFib. He acknowledges that his weight could be better and has been working on weight loss, having lost 53 pounds since started seeing the but his weight still has been fluctuating overall. He maintains a diet free of processed foods and exercises five days a week, including stationary biking, weight lifting, and walking. He also mows 20 acres of grass as part of his physical activity. He reports no issues with his eyesight, hearing, or teeth. He walks steadily but is careful about where he steps, especially when going down stairs. He has been married for 65 years and has a supportive family. He takes his medications as prescribed, including dofetilide, magnesium oxide, metoprolol succinate, omisartan, and Xarelto.      Labs   Lab Results  Component Value Date   NA 136 05/11/2023   K 5.5 (H) 05/11/2023   CO2 20 05/11/2023   GLUCOSE 104 (H) 05/11/2023   BUN 15 05/11/2023   CREATININE 1.19 05/11/2023   CALCIUM 9.7 05/11/2023   GFR 65.43  06/11/2015   EGFR 61 05/11/2023   GFRNONAA 52 (L) 05/05/2023      Latest Ref Rng & Units 05/11/2023    9:04 AM 05/05/2023    1:58 AM 05/04/2023    3:15 AM  BMP  Glucose 70 - 99 mg/dL 027  253  664   BUN 8 - 27 mg/dL 15  18  8    Creatinine 0.76 - 1.27 mg/dL 4.03  4.74  2.59   BUN/Creat Ratio 10 - 24 13     Sodium 134 - 144 mmol/L 136  135  134   Potassium 3.5 - 5.2 mmol/L 5.5  4.2  3.9   Chloride 96 - 106 mmol/L 99  104  104   CO2 20 - 29 mmol/L 20  24  22    Calcium 8.6 - 10.2 mg/dL 9.7  9.0  8.9       Latest Ref Rng & Units 02/02/2023   11:26 AM 10/12/2022    2:07 PM 09/22/2021    8:55 AM  CBC  WBC 3.4 - 10.8 x10E3/uL 8.7   6.6   Hemoglobin 13.0 - 17.7 g/dL 56.3  87.5  64.3   Hematocrit 37.5 - 51.0 % 39.3   38.9   Platelets 150 - 450 x10E3/uL 319   270    No results found for: "HGBA1C"  Lab Results  Component Value Date   TSH 2.890 10/12/2022    External Labs:  Patient supplied labs from PCP  02/09/2024:  A1c 5.7%.  Serum glucose 92 mg, BUN 14, creatinine 1.06, EGFR 70  mL, potassium 4.6, LFTs normal.  [facility] labs 08/05/2023:  Total cholesterol 125, triglycerides 79, HDL 50, LDL 59.  Review of Systems  Cardiovascular:  Positive for palpitations. Negative for chest pain, dyspnea on exertion and leg swelling.   Physical Exam:   VS:  BP 130/72 (BP Location: Left Arm, Patient Position: Sitting, Cuff Size: Large)   Pulse 62   Ht 5\' 9"  (1.753 m)   Wt (!) 320 lb 9.6 oz (145.4 kg)   SpO2 97%   BMI 47.34 kg/m    Wt Readings from Last 3 Encounters:  03/21/24 (!) 320 lb 9.6 oz (145.4 kg)  09/14/23 (!) 303 lb (137.4 kg)  05/20/23 (!) 312 lb 6.4 oz (141.7 kg)   Physical Exam Constitutional:      Appearance: He is morbidly obese.  Neck:     Vascular: No carotid bruit or JVD.  Cardiovascular:     Rate and Rhythm: Normal rate and regular rhythm.     Pulses: Intact distal pulses.     Heart sounds: Normal heart sounds. No murmur heard.    No gallop.  Pulmonary:     Effort:  Pulmonary effort is normal.     Breath sounds: Normal breath sounds.  Abdominal:     General: Bowel sounds are normal.     Palpations: Abdomen is soft.  Musculoskeletal:     Right lower leg: Edema (2+ pitting bilateral below knee) present.     Left lower leg: Edema (2+ pitting bilateral below knee) present.    Studies Reviewed: .    CT angiogram chest 01/26/2023: 1. The previous nodular ground-glass opacities have nearly resolved compatible with resolving infection. 2. Cardiomegaly. New pulmonary edema since 10/21/2022. Trace right pleural effusion. 3. Unchanged 49 mm ascending aortic aneurysm. Ascending thoracic aortic aneurysm. Recommend semi-annual imaging followup by CTA or MRA and referral to cardiothoracic surgery if not already obtained. Aortic aneurysm stable since 2017.   Direct current cardioversion 04/16/2023 11:18 AM   ECHOCARDIOGRAM COMPLETE 03/08/2024  1. Left ventricular ejection fraction, by estimation, is 60 to 65%. The left ventricle has normal function. The left ventricle has no regional wall motion abnormalities. Left ventricular diastolic function could not be evaluated. 2. Right ventricular systolic function is mildly reduced. The right ventricular size is moderately enlarged. There is mildly elevated pulmonary artery systolic pressure. 3. Right atrial size was severely dilated. 4. The mitral valve is normal in structure. Mild mitral valve regurgitation. No evidence of mitral stenosis. 5. The aortic valve is normal in structure. Aortic valve regurgitation is not visualized. No aortic stenosis is present. 6. Aortic dilatation noted. There is borderline dilatation of the aortic root, measuring 39 mm. There is severe dilatation of the ascending aorta, measuring 48 mm. 7. The inferior vena cava is dilated in size with <50% respiratory variability, suggesting right atrial pressure of 15 mmHg.  No change in echocardiogram since 02/24/2023. EKG:    EKG  Interpretation Date/Time:  Tuesday March 21 2024 08:13:02 EDT Ventricular Rate:  62 PR Interval:    QRS Duration:  98 QT Interval:  468 QTC Calculation: 475 R Axis:   -54  Text Interpretation: EKG 03/21/2024: Atrial fibrillation with controlled ventricular response at rate of 62 bpm, left intrafascicular block.  Incomplete right bundle branch block.  Poor R progression, probably normal variant.  No evidence of ischemia, normal QT interval.  Compared to 05/05/2023, sinus rhythm with first-degree AV block has been replaced Confirmed by Delrae Rend [id number]) on 03/21/2024 8:42:16 AM  EKG 09/14/2023: Sinus rhythm with first-degree AV block at the rate of 55 bpm, left anterior fascicular block.  Incomplete right bundle branch block.  Poor R wave progression, anterolateral infarct old.  Compared to 05/13/2023, no change.  Medications and allergies    Allergies  Allergen Reactions   Amiodarone Other (See Comments)    Pulmonary toxicity   Eszopiclone     Other Reaction(s): not effective     Current Outpatient Medications:    acetaminophen (TYLENOL) 500 MG tablet, Take 500 mg by mouth every 6 (six) hours as needed for headache, moderate pain or mild pain (pain)., Disp: , Rfl:    ALPRAZolam (XANAX) 0.5 MG tablet, Take 0.25-0.5 mg by mouth 3 (three) times daily as needed (ear ringing.)., Disp: , Rfl:    Azelaic Acid (FINACEA) 15 % gel, Apply 1 Application topically 2 (two) times daily as needed (Face)., Disp: , Rfl:    dapagliflozin propanediol (FARXIGA) 10 MG TABS tablet, TAKE 1 TABLET(10 MG) BY MOUTH DAILY BEFORE BREAKFAST, Disp: 90 tablet, Rfl: 2   dofetilide (TIKOSYN) 250 MCG capsule, TAKE 1 CAPSULE(250 MCG) BY MOUTH TWICE DAILY, Disp: 180 capsule, Rfl: 1   Ferrous Sulfate (IRON PO), Take 1 tablet by mouth daily as needed (Supplement)., Disp: , Rfl:    finasteride (PROSCAR) 5 MG tablet, Take 5 mg by mouth daily., Disp: , Rfl:    furosemide (LASIX) 40 MG tablet, Take 1 tablet (40 mg total) by  mouth daily as needed for fluid. (Patient taking differently: Take 40 mg by mouth daily.), Disp: 90 tablet, Rfl: 1   magnesium oxide (MAGOX 400) 400 (240 Mg) MG tablet, Take 1 tablet (400 mg total) by mouth daily., Disp: 90 tablet, Rfl: 2   metoprolol succinate (TOPROL-XL) 100 MG 24 hr tablet, Take 100 mg by mouth in the morning. Take with or immediately following a meal., Disp: , Rfl:    olmesartan (BENICAR) 40 MG tablet, Take 40 mg by mouth daily., Disp: , Rfl:    pantoprazole (PROTONIX) 40 MG tablet, Take 40 mg by mouth in the morning., Disp: , Rfl: 0   potassium chloride SA (KLOR-CON M) 20 MEQ tablet, Take 1 tablet (20 mEq total) by mouth in the morning., Disp: 90 tablet, Rfl: 1   rosuvastatin (CRESTOR) 20 MG tablet, TAKE 1 TABLET(20 MG) BY MOUTH DAILY (Patient taking differently: Take 20 mg by mouth daily.), Disp: 90 tablet, Rfl: 1   sertraline (ZOLOFT) 100 MG tablet, Take 100 mg by mouth in the morning., Disp: , Rfl: 0   tamsulosin (FLOMAX) 0.4 MG CAPS capsule, Take 0.4 mg by mouth daily after breakfast., Disp: , Rfl:    triamcinolone cream (KENALOG) 0.1 %, Apply 1 Application topically 2 (two) times daily as needed (Rash)., Disp: , Rfl:    XARELTO 20 MG TABS tablet, TAKE 1 TABLET(20 MG) BY MOUTH DAILY WITH SUPPER, Disp: 90 tablet, Rfl: 1   ASSESSMENT AND PLAN: .      ICD-10-CM   1. Paroxysmal atrial fibrillation (HCC)  I48.0 EKG 12-Lead    ECHOCARDIOGRAM COMPLETE    2. Aneurysm of ascending aorta without rupture (HCC)  I71.21 ECHOCARDIOGRAM COMPLETE    3. Essential hypertension  I10     4. Secondary hypercoagulable state (HCC)  D68.69       No orders of the defined types were placed in this encounter.   Medications Discontinued During This Encounter  Medication Reason   albuterol (VENTOLIN HFA) 108 (90 Base) MCG/ACT inhaler No longer needed (for PRN  medications)   famotidine (PEPCID AC) 10 MG chewable tablet Patient has not taken in last 30 days     Assessment and Plan     Paroxysmal Atrial Fibrillation   He experiences intermittent, self-resolving episodes of atrial fibrillation with dyspnea during prolonged episodes, EKG today revealed atrial fibrillation but on auscultation, he had very regular rhythm and I suspect he probably has flipped back to sinus rhythm.  His symptoms of atrial fibrillation onset is marked fatigue and dyspnea which she has not had.  The condition is well-managed with dofetilide, magnesium oxide, and metoprolol succinate. Continue dofetilide 250 mcg twice a day, magnesium oxide 400 mg once a day, and metoprolol succinate 100 mg once a day.  Ascending Aortic Aneurysm   The ascending aortic aneurysm has remained stable at 5 cm since 2009. Repeat echocardiogram in one year.  Primary Hypertension   Hypertension is well-controlled with the current medication regimen. Blood pressure today is 130/72 mmHg. Continue omisartan 40 mg once a day and encourage periodic home blood pressure monitoring.  Prediabetes   A1c is 5.7%, indicating prediabetes. Weight loss has contributed to improved glycemic control. Encourage continued weight loss and dietary modifications to manage prediabetes.  Hyperlipidemia   Lipid panel shows total cholesterol of 125 mg/dL, triglycerides 79 mg/dL, HDL 50 mg/dL, and LDL 59 mg/dL, indicating well-controlled lipid levels. Continue current lifestyle modifications and monitoring.        Signed,  Yates Decamp, MD, [facility] 03/21/2024, 8:02 PM [facility] [address] [place], [place] [address] Phone: [phone]. Fax:  [phone]

## 2024-03-21 NOTE — Patient Instructions (Signed)
 Medication Instructions:  Your physician recommends that you continue on your current medications as directed. Please refer to the Current Medication list given to you today.  *If you need a refill on your cardiac medications before your next appointment, please call your pharmacy*   Lab Work: none If you have labs (blood work) drawn today and your tests are completely normal, you will receive your results only by: MyChart Message (if you have MyChart) OR A paper copy in the mail If you have any lab test that is abnormal or we need to change your treatment, we will call you to review the results.   Testing/Procedures: Your physician has requested that you have an echocardiogram. Echocardiography is a painless test that uses sound waves to create images of your heart. It provides your doctor with information about the size and shape of your heart and how well your heart's chambers and valves are working. This procedure takes approximately one hour. There are no restrictions for this procedure. Please do NOT wear cologne, perfume, aftershave, or lotions (deodorant is allowed). Please arrive 15 minutes prior to your appointment time.  Please note: We ask at that you not bring children with you during ultrasound (echo/ vascular) testing. Due to room size and safety concerns, children are not allowed in the ultrasound rooms during exams. Our front office staff cannot provide observation of children in our lobby area while testing is being conducted. An adult accompanying a patient to their appointment will only be allowed in the ultrasound room at the discretion of the ultrasound technician under special circumstances. We apologize for any inconvenience.    Follow-Up: At [facility], you and your health needs are our priority.  As part of our continuing mission to provide you with exceptional heart care, we have created designated Provider Care Teams.  These Care Teams include your  primary Cardiologist (physician) and Advanced Practice Providers (APPs -  Physician Assistants and Nurse Practitioners) who all work together to provide you with the care you need, when you need it.  We recommend signing up for the patient portal called "MyChart".  Sign up information is provided on this After Visit Summary.  MyChart is used to connect with patients for Virtual Visits (Telemedicine).  Patients are able to view lab/test results, encounter notes, upcoming appointments, etc.  Non-urgent messages can be sent to your provider as well.   To learn more about what you can do with MyChart, go to [email or web address].    Your next appointment:   12 month(s)  Provider:   Yates Decamp, MD     Other Instructions    1st Floor: - Lobby - Registration  - Pharmacy  - Lab - Cafe  2nd Floor: - PV Lab - Diagnostic Testing (echo, CT, nuclear med)  3rd Floor: - Vacant  4th Floor: - TCTS (cardiothoracic surgery) - AFib Clinic - Structural Heart Clinic - Vascular Surgery  - Vascular Ultrasound  5th Floor: - HeartCare Cardiology (general and EP) - Clinical Pharmacy for coumadin, hypertension, lipid, weight-loss medications, and med management appointments    Valet parking services will be available as well.

## 2024-03-28 ENCOUNTER — Other Ambulatory Visit: Payer: Self-pay | Admitting: Cardiology

## 2024-03-28 DIAGNOSIS — I480 Paroxysmal atrial fibrillation: Secondary | ICD-10-CM

## 2024-05-23 ENCOUNTER — Other Ambulatory Visit: Payer: Self-pay | Admitting: Cardiology

## 2024-05-23 DIAGNOSIS — I480 Paroxysmal atrial fibrillation: Secondary | ICD-10-CM

## 2024-08-10 ENCOUNTER — Other Ambulatory Visit: Payer: Self-pay | Admitting: Cardiology

## 2024-08-10 DIAGNOSIS — I480 Paroxysmal atrial fibrillation: Secondary | ICD-10-CM

## 2024-08-10 NOTE — Telephone Encounter (Signed)
 Prescription refill request for Xarelto received.  Indication: afib  Last office visit: [place], 03/21/2024 Weight: 145 kg  Age: 83 yo  Scr: 1.06, 02/09/2024 CrCl: 108 ml/min   Refill sent

## 2024-08-10 NOTE — Telephone Encounter (Signed)
 Refill Request.

## 2024-08-23 DIAGNOSIS — E785 Hyperlipidemia, unspecified: Secondary | ICD-10-CM | POA: Diagnosis not present

## 2024-08-23 DIAGNOSIS — I10 Essential (primary) hypertension: Secondary | ICD-10-CM | POA: Diagnosis not present

## 2024-08-23 DIAGNOSIS — Z131 Encounter for screening for diabetes mellitus: Secondary | ICD-10-CM | POA: Diagnosis not present

## 2024-08-26 ENCOUNTER — Other Ambulatory Visit: Payer: Self-pay | Admitting: Cardiology

## 2024-08-26 DIAGNOSIS — I5033 Acute on chronic diastolic (congestive) heart failure: Secondary | ICD-10-CM

## 2024-08-30 DIAGNOSIS — I10 Essential (primary) hypertension: Secondary | ICD-10-CM | POA: Diagnosis not present

## 2024-08-30 DIAGNOSIS — D6869 Other thrombophilia: Secondary | ICD-10-CM | POA: Diagnosis not present

## 2024-08-30 DIAGNOSIS — F419 Anxiety disorder, unspecified: Secondary | ICD-10-CM | POA: Diagnosis not present

## 2024-08-30 DIAGNOSIS — Z0000 Encounter for general adult medical examination without abnormal findings: Secondary | ICD-10-CM | POA: Diagnosis not present

## 2024-08-30 DIAGNOSIS — I480 Paroxysmal atrial fibrillation: Secondary | ICD-10-CM | POA: Diagnosis not present

## 2024-08-30 DIAGNOSIS — Z1331 Encounter for screening for depression: Secondary | ICD-10-CM | POA: Diagnosis not present

## 2024-08-30 DIAGNOSIS — Z7901 Long term (current) use of anticoagulants: Secondary | ICD-10-CM | POA: Diagnosis not present

## 2024-08-30 DIAGNOSIS — F325 Major depressive disorder, single episode, in full remission: Secondary | ICD-10-CM | POA: Diagnosis not present

## 2024-09-12 DIAGNOSIS — L821 Other seborrheic keratosis: Secondary | ICD-10-CM | POA: Diagnosis not present

## 2024-09-12 DIAGNOSIS — Z85828 Personal history of other malignant neoplasm of skin: Secondary | ICD-10-CM | POA: Diagnosis not present

## 2024-09-12 DIAGNOSIS — L570 Actinic keratosis: Secondary | ICD-10-CM | POA: Diagnosis not present

## 2024-09-12 DIAGNOSIS — L814 Other melanin hyperpigmentation: Secondary | ICD-10-CM | POA: Diagnosis not present

## 2024-09-12 DIAGNOSIS — L820 Inflamed seborrheic keratosis: Secondary | ICD-10-CM | POA: Diagnosis not present

## 2024-09-12 DIAGNOSIS — L718 Other rosacea: Secondary | ICD-10-CM | POA: Diagnosis not present

## 2024-09-12 DIAGNOSIS — B078 Other viral warts: Secondary | ICD-10-CM | POA: Diagnosis not present

## 2024-09-28 DIAGNOSIS — R609 Edema, unspecified: Secondary | ICD-10-CM | POA: Diagnosis not present

## 2024-09-28 DIAGNOSIS — L0390 Cellulitis, unspecified: Secondary | ICD-10-CM | POA: Diagnosis not present

## 2024-10-24 DIAGNOSIS — I8311 Varicose veins of right lower extremity with inflammation: Secondary | ICD-10-CM | POA: Diagnosis not present

## 2024-10-24 DIAGNOSIS — L308 Other specified dermatitis: Secondary | ICD-10-CM | POA: Diagnosis not present

## 2024-10-24 DIAGNOSIS — L0889 Other specified local infections of the skin and subcutaneous tissue: Secondary | ICD-10-CM | POA: Diagnosis not present

## 2024-10-24 DIAGNOSIS — Z85828 Personal history of other malignant neoplasm of skin: Secondary | ICD-10-CM | POA: Diagnosis not present

## 2024-10-24 DIAGNOSIS — I8312 Varicose veins of left lower extremity with inflammation: Secondary | ICD-10-CM | POA: Diagnosis not present

## 2024-10-26 DIAGNOSIS — R3912 Poor urinary stream: Secondary | ICD-10-CM | POA: Diagnosis not present

## 2024-10-30 DIAGNOSIS — L03116 Cellulitis of left lower limb: Secondary | ICD-10-CM | POA: Diagnosis not present

## 2024-10-30 DIAGNOSIS — R600 Localized edema: Secondary | ICD-10-CM | POA: Diagnosis not present

## 2024-10-30 DIAGNOSIS — L03115 Cellulitis of right lower limb: Secondary | ICD-10-CM | POA: Diagnosis not present

## 2024-10-30 DIAGNOSIS — F419 Anxiety disorder, unspecified: Secondary | ICD-10-CM | POA: Diagnosis not present

## 2024-11-09 DIAGNOSIS — S81802D Unspecified open wound, left lower leg, subsequent encounter: Secondary | ICD-10-CM | POA: Diagnosis not present

## 2024-11-09 DIAGNOSIS — R609 Edema, unspecified: Secondary | ICD-10-CM | POA: Diagnosis not present

## 2024-11-09 DIAGNOSIS — I272 Pulmonary hypertension, unspecified: Secondary | ICD-10-CM | POA: Diagnosis not present

## 2024-11-09 DIAGNOSIS — S81801D Unspecified open wound, right lower leg, subsequent encounter: Secondary | ICD-10-CM | POA: Diagnosis not present

## 2024-11-10 ENCOUNTER — Other Ambulatory Visit: Payer: Self-pay | Admitting: Cardiology

## 2024-11-10 DIAGNOSIS — I480 Paroxysmal atrial fibrillation: Secondary | ICD-10-CM

## 2024-11-10 NOTE — Telephone Encounter (Signed)
 Prescription refill request for Xarelto  received.  Indication:afib Last office visit:3/25 Weight:145.4  kg Age:83 Scr:1.06  2/25 CrCl:108.59  ml/min  Prescription refilled

## 2024-12-05 ENCOUNTER — Encounter (HOSPITAL_BASED_OUTPATIENT_CLINIC_OR_DEPARTMENT_OTHER): Admitting: Internal Medicine

## 2024-12-07 ENCOUNTER — Encounter (HOSPITAL_BASED_OUTPATIENT_CLINIC_OR_DEPARTMENT_OTHER): Attending: Internal Medicine | Admitting: Internal Medicine

## 2024-12-07 DIAGNOSIS — I890 Lymphedema, not elsewhere classified: Secondary | ICD-10-CM | POA: Diagnosis not present

## 2024-12-07 DIAGNOSIS — I87311 Chronic venous hypertension (idiopathic) with ulcer of right lower extremity: Secondary | ICD-10-CM

## 2024-12-07 DIAGNOSIS — I5032 Chronic diastolic (congestive) heart failure: Secondary | ICD-10-CM | POA: Insufficient documentation

## 2024-12-07 DIAGNOSIS — I4891 Unspecified atrial fibrillation: Secondary | ICD-10-CM | POA: Insufficient documentation

## 2024-12-07 DIAGNOSIS — L97812 Non-pressure chronic ulcer of other part of right lower leg with fat layer exposed: Secondary | ICD-10-CM | POA: Diagnosis not present

## 2024-12-07 DIAGNOSIS — I87312 Chronic venous hypertension (idiopathic) with ulcer of left lower extremity: Secondary | ICD-10-CM

## 2024-12-07 DIAGNOSIS — I87313 Chronic venous hypertension (idiopathic) with ulcer of bilateral lower extremity: Secondary | ICD-10-CM | POA: Insufficient documentation

## 2024-12-07 DIAGNOSIS — L97822 Non-pressure chronic ulcer of other part of left lower leg with fat layer exposed: Secondary | ICD-10-CM

## 2024-12-07 DIAGNOSIS — Z7901 Long term (current) use of anticoagulants: Secondary | ICD-10-CM | POA: Insufficient documentation

## 2024-12-11 ENCOUNTER — Encounter (HOSPITAL_BASED_OUTPATIENT_CLINIC_OR_DEPARTMENT_OTHER): Admitting: Internal Medicine

## 2024-12-11 DIAGNOSIS — L97812 Non-pressure chronic ulcer of other part of right lower leg with fat layer exposed: Secondary | ICD-10-CM | POA: Diagnosis not present

## 2024-12-11 DIAGNOSIS — I87312 Chronic venous hypertension (idiopathic) with ulcer of left lower extremity: Secondary | ICD-10-CM

## 2024-12-11 DIAGNOSIS — I87311 Chronic venous hypertension (idiopathic) with ulcer of right lower extremity: Secondary | ICD-10-CM | POA: Diagnosis not present

## 2024-12-11 DIAGNOSIS — L97822 Non-pressure chronic ulcer of other part of left lower leg with fat layer exposed: Secondary | ICD-10-CM | POA: Diagnosis not present

## 2024-12-18 ENCOUNTER — Encounter (HOSPITAL_BASED_OUTPATIENT_CLINIC_OR_DEPARTMENT_OTHER): Admitting: General Surgery

## 2024-12-18 DIAGNOSIS — I87313 Chronic venous hypertension (idiopathic) with ulcer of bilateral lower extremity: Secondary | ICD-10-CM | POA: Diagnosis not present

## 2024-12-25 ENCOUNTER — Encounter (HOSPITAL_BASED_OUTPATIENT_CLINIC_OR_DEPARTMENT_OTHER): Admitting: Internal Medicine

## 2024-12-25 DIAGNOSIS — L97812 Non-pressure chronic ulcer of other part of right lower leg with fat layer exposed: Secondary | ICD-10-CM | POA: Diagnosis not present

## 2024-12-25 DIAGNOSIS — I87311 Chronic venous hypertension (idiopathic) with ulcer of right lower extremity: Secondary | ICD-10-CM

## 2024-12-25 DIAGNOSIS — I87313 Chronic venous hypertension (idiopathic) with ulcer of bilateral lower extremity: Secondary | ICD-10-CM | POA: Diagnosis not present

## 2024-12-25 DIAGNOSIS — I87312 Chronic venous hypertension (idiopathic) with ulcer of left lower extremity: Secondary | ICD-10-CM | POA: Diagnosis not present

## 2024-12-25 DIAGNOSIS — L97822 Non-pressure chronic ulcer of other part of left lower leg with fat layer exposed: Secondary | ICD-10-CM | POA: Diagnosis not present

## 2024-12-29 ENCOUNTER — Ambulatory Visit: Admitting: Podiatry

## 2024-12-29 ENCOUNTER — Encounter: Payer: Self-pay | Admitting: Podiatry

## 2024-12-29 DIAGNOSIS — B351 Tinea unguium: Secondary | ICD-10-CM | POA: Diagnosis not present

## 2024-12-29 DIAGNOSIS — M79672 Pain in left foot: Secondary | ICD-10-CM

## 2024-12-29 DIAGNOSIS — L600 Ingrowing nail: Secondary | ICD-10-CM | POA: Diagnosis not present

## 2024-12-29 DIAGNOSIS — M79671 Pain in right foot: Secondary | ICD-10-CM | POA: Diagnosis not present

## 2024-12-29 NOTE — Progress Notes (Signed)
 Patient presents for evaluation and treatment of tenderness and some redness around nails feet.  Tenderness around toes with walking and wearing shoes.  Nails get extremely thickened.  They tried oral and topical and no success with clearing of fungus from the nails.  Hallux nail sometimes gets thickened tender.  Physical exam:  General appearance: Alert, pleasant, and in no acute distress.  Vascular: Pedal pulses: DP 2/4 B/L, PT 0/4 B/L.  Severe edema lower legs bilaterally.  Capillary refill time immediate bilaterally  Neurologic:  Dermatologic:  Nails thickened, disfigured, discolored 1-5 BL with subungual debris.  Redness and hypertrophic nail folds along nail folds bilaterally but no signs of drainage or infection.  Hallux nails are extremely thickened and dystrophic and gryphotic with some dried blood in the nail plates.  Musculoskeletal:  Hammertoes 2 through 5 bilaterally normal muscle strength lower extremity bilaterally   Diagnosis: 1. Painful onychomycotic nails 1 through 5 bilaterally. 2. Pain toes 1 through 5 bilaterally. 3.  Ingrown nails hallux bilaterally  Plan: -New patient office visit for evaluation and management level 3.  Modifier 25. - Discussed patient the hallux nails and if these continue to bother him matrixectomies could be considered.  Would recommend just periodic debridement of the nails for now.  If the hallux nails do start to get ingrown bleeding or draining patient call immediately.  -Debrided onychomycotic nails 1 through 5 bilaterally.  Sharply debrided nails with nail clipper and reduced with a power bur.  Return 3 months RFC

## 2025-03-15 ENCOUNTER — Ambulatory Visit (HOSPITAL_COMMUNITY)

## 2025-03-26 ENCOUNTER — Ambulatory Visit: Admitting: Cardiology

## 2025-04-02 ENCOUNTER — Ambulatory Visit: Admitting: Podiatry
